# Patient Record
Sex: Male | Born: 1965
Health system: Southern US, Community
[De-identification: ages and names within clinical notes are randomized; demographics above are authoritative.]

## PROBLEM LIST (undated history)

## (undated) DIAGNOSIS — E785 Hyperlipidemia, unspecified: Secondary | ICD-10-CM

## (undated) DIAGNOSIS — I10 Essential (primary) hypertension: Secondary | ICD-10-CM

## (undated) DIAGNOSIS — T7840XA Allergy, unspecified, initial encounter: Secondary | ICD-10-CM

## (undated) DIAGNOSIS — E119 Type 2 diabetes mellitus without complications: Secondary | ICD-10-CM

## (undated) DIAGNOSIS — E291 Testicular hypofunction: Secondary | ICD-10-CM

## (undated) HISTORY — DX: Hyperlipidemia, unspecified: E78.5

## (undated) HISTORY — DX: Essential (primary) hypertension: I10

## (undated) HISTORY — DX: Allergy, unspecified, initial encounter: T78.40XA

## (undated) HISTORY — DX: Testicular hypofunction: E29.1

---

## 1991-09-28 HISTORY — PX: VASECTOMY: SHX75

## 2008-07-17 ENCOUNTER — Ambulatory Visit: Payer: Self-pay | Admitting: Family Medicine

## 2008-07-28 ENCOUNTER — Ambulatory Visit: Payer: Self-pay | Admitting: Family Medicine

## 2010-12-30 ENCOUNTER — Ambulatory Visit: Payer: Self-pay | Admitting: Family Medicine

## 2011-01-26 ENCOUNTER — Ambulatory Visit: Payer: Self-pay | Admitting: Family Medicine

## 2012-04-10 ENCOUNTER — Ambulatory Visit: Payer: Self-pay | Admitting: Family Medicine

## 2012-04-10 LAB — HEMOGLOBIN A1C: Hemoglobin A1C: 6.2 % (ref 4.2–6.3)

## 2014-04-01 LAB — LIPID PANEL
CHOLESTEROL: 151 mg/dL (ref 0–200)
HDL: 48 mg/dL (ref 35–70)
LDL Cholesterol: 90 mg/dL
Triglycerides: 64 mg/dL (ref 40–160)

## 2014-05-10 ENCOUNTER — Ambulatory Visit: Payer: Self-pay | Admitting: Gastroenterology

## 2014-05-10 LAB — HM COLONOSCOPY

## 2014-05-15 LAB — PATHOLOGY REPORT

## 2014-12-16 LAB — HEMOGLOBIN A1C: Hgb A1c MFr Bld: 6.8 % — AB (ref 4.0–6.0)

## 2015-03-02 ENCOUNTER — Encounter: Payer: Self-pay | Admitting: *Deleted

## 2015-03-02 ENCOUNTER — Emergency Department
Admission: EM | Admit: 2015-03-02 | Discharge: 2015-03-02 | Disposition: A | Discharge status: Home / Self Care | Payer: 59 | Attending: Emergency Medicine | Admitting: Emergency Medicine

## 2015-03-02 ENCOUNTER — Other Ambulatory Visit: Payer: Self-pay

## 2015-03-02 DIAGNOSIS — E119 Type 2 diabetes mellitus without complications: Secondary | ICD-10-CM | POA: Diagnosis not present

## 2015-03-02 DIAGNOSIS — Z794 Long term (current) use of insulin: Secondary | ICD-10-CM | POA: Insufficient documentation

## 2015-03-02 DIAGNOSIS — Y998 Other external cause status: Secondary | ICD-10-CM | POA: Insufficient documentation

## 2015-03-02 DIAGNOSIS — Z79899 Other long term (current) drug therapy: Secondary | ICD-10-CM | POA: Insufficient documentation

## 2015-03-02 DIAGNOSIS — R55 Syncope and collapse: Secondary | ICD-10-CM | POA: Diagnosis present

## 2015-03-02 DIAGNOSIS — Y9389 Activity, other specified: Secondary | ICD-10-CM | POA: Insufficient documentation

## 2015-03-02 DIAGNOSIS — Y9289 Other specified places as the place of occurrence of the external cause: Secondary | ICD-10-CM | POA: Insufficient documentation

## 2015-03-02 DIAGNOSIS — S0181XA Laceration without foreign body of other part of head, initial encounter: Secondary | ICD-10-CM

## 2015-03-02 DIAGNOSIS — W01198A Fall on same level from slipping, tripping and stumbling with subsequent striking against other object, initial encounter: Secondary | ICD-10-CM | POA: Insufficient documentation

## 2015-03-02 DIAGNOSIS — S01112A Laceration without foreign body of left eyelid and periocular area, initial encounter: Secondary | ICD-10-CM | POA: Diagnosis not present

## 2015-03-02 DIAGNOSIS — Z23 Encounter for immunization: Secondary | ICD-10-CM | POA: Diagnosis not present

## 2015-03-02 HISTORY — DX: Type 2 diabetes mellitus without complications: E11.9

## 2015-03-02 LAB — BASIC METABOLIC PANEL
Anion gap: 9 (ref 5–15)
BUN: 23 mg/dL — AB (ref 6–20)
CO2: 24 mmol/L (ref 22–32)
Calcium: 9.4 mg/dL (ref 8.9–10.3)
Chloride: 102 mmol/L (ref 101–111)
Creatinine, Ser: 1.02 mg/dL (ref 0.61–1.24)
Glucose, Bld: 175 mg/dL — ABNORMAL HIGH (ref 65–99)
Potassium: 4 mmol/L (ref 3.5–5.1)
Sodium: 135 mmol/L (ref 135–145)

## 2015-03-02 LAB — CBC
HCT: 46.6 % (ref 40.0–52.0)
HEMOGLOBIN: 16.8 g/dL (ref 13.0–18.0)
MCH: 30.8 pg (ref 26.0–34.0)
MCHC: 36.1 g/dL — ABNORMAL HIGH (ref 32.0–36.0)
MCV: 85.4 fL (ref 80.0–100.0)
Platelets: 183 10*3/uL (ref 150–440)
RBC: 5.46 MIL/uL (ref 4.40–5.90)
RDW: 12.9 % (ref 11.5–14.5)
WBC: 9.2 10*3/uL (ref 3.8–10.6)

## 2015-03-02 LAB — TROPONIN I: Troponin I: <0.03 ng/mL (ref <0.031)

## 2015-03-02 LAB — GLUCOSE, CAPILLARY: Glucose-Capillary: 185 mg/dL — ABNORMAL HIGH (ref 65–99)

## 2015-03-02 MED ORDER — TETANUS-DIPHTH-ACELL PERTUSSIS 5-2.5-18.5 LF-MCG/0.5 IM SUSP
INTRAMUSCULAR | Status: AC
  Administered 2015-03-02: 0.5 mL via INTRAMUSCULAR
  Filled 2015-03-02: qty 0.5

## 2015-03-02 MED ORDER — LIDOCAINE-EPINEPHRINE (PF) 1 %-1:200000 IJ SOLN
INTRAMUSCULAR | Status: AC
  Filled 2015-03-02: qty 30

## 2015-03-02 MED ORDER — SODIUM CHLORIDE 0.9 % IV BOLUS (SEPSIS)
1000.0000 mL | Freq: Once | INTRAVENOUS | Status: AC
  Administered 2015-03-02: 1000 mL via INTRAVENOUS

## 2015-03-02 MED ORDER — TETANUS-DIPHTH-ACELL PERTUSSIS 5-2.5-18.5 LF-MCG/0.5 IM SUSP
0.5000 mL | Freq: Once | INTRAMUSCULAR | Status: AC
  Administered 2015-03-02: 0.5 mL via INTRAMUSCULAR

## 2015-03-02 NOTE — ED Notes (Signed)
Pt alert and oriented X4, active, cooperative, pt in NAD. RR even and unlabored, color WNL.  Pt informed to return if any life threatening symptoms occur.   Signature pad not receiving signatures for patient to sign. Pt verbalizes understanding, wife at bedside.

## 2015-03-02 NOTE — ED Notes (Signed)
Pt resting, lights dimmed for comfort. Pt declines needs at this time.

## 2015-03-02 NOTE — Discharge Instructions (Signed)
Facial Laceration ° A facial laceration is a cut on the face. These injuries can be painful and cause bleeding. Lacerations usually heal quickly, but they need special care to reduce scarring. °DIAGNOSIS  °Your health care provider will take a medical history, ask for details about how the injury occurred, and examine the wound to determine how deep the cut is. °TREATMENT  °Some facial lacerations may not require closure. Others may not be able to be closed because of an increased risk of infection. The risk of infection and the chance for successful closure will depend on various factors, including the amount of time since the injury occurred. °The wound may be cleaned to help prevent infection. If closure is appropriate, pain medicines may be given if needed. Your health care provider will use stitches (sutures), wound glue (adhesive), or skin adhesive strips to repair the laceration. These tools bring the skin edges together to allow for faster healing and a better cosmetic outcome. If needed, you may also be given a tetanus shot. °HOME CARE INSTRUCTIONS °· Only take over-the-counter or prescription medicines as directed by your health care provider. °· Follow your health care provider's instructions for wound care. These instructions will vary depending on the technique used for closing the wound. °For Sutures: °· Keep the wound clean and dry.   °· If you were given a bandage (dressing), you should change it at least once a day. Also change the dressing if it becomes wet or dirty, or as directed by your health care provider.   °· Wash the wound with soap and water 2 times a day. Rinse the wound off with water to remove all soap. Pat the wound dry with a clean towel.   °· After cleaning, apply a thin layer of the antibiotic ointment recommended by your health care provider. This will help prevent infection and keep the dressing from sticking.   °· You may shower as usual after the first 24 hours. Do not soak the  wound in water until the sutures are removed.   °· Get your sutures removed as directed by your health care provider. With facial lacerations, sutures should usually be taken out after 4-5 days to avoid stitch marks.   °· Wait a few days after your sutures are removed before applying any makeup. °For Skin Adhesive Strips: °· Keep the wound clean and dry.   °· Do not get the skin adhesive strips wet. You may bathe carefully, using caution to keep the wound dry.   °· If the wound gets wet, pat it dry with a clean towel.   °· Skin adhesive strips will fall off on their own. You may trim the strips as the wound heals. Do not remove skin adhesive strips that are still stuck to the wound. They will fall off in time.   °For Wound Adhesive: °· You may briefly wet your wound in the shower or bath. Do not soak or scrub the wound. Do not swim. Avoid periods of heavy sweating until the skin adhesive has fallen off on its own. After showering or bathing, gently pat the wound dry with a clean towel.   °· Do not apply liquid medicine, cream medicine, ointment medicine, or makeup to your wound while the skin adhesive is in place. This may loosen the film before your wound is healed.   °· If a dressing is placed over the wound, be careful not to apply tape directly over the skin adhesive. This may cause the adhesive to be pulled off before the wound is healed.   °· Avoid   prolonged exposure to sunlight or tanning lamps while the skin adhesive is in place. °· The skin adhesive will usually remain in place for 5-10 days, then naturally fall off the skin. Do not pick at the adhesive film.   °After Healing: °Once the wound has healed, cover the wound with sunscreen during the day for 1 full year. This can help minimize scarring. Exposure to ultraviolet light in the first year will darken the scar. It can take 1-2 years for the scar to lose its redness and to heal completely.  °SEEK IMMEDIATE MEDICAL CARE IF: °· You have redness, pain, or  swelling around the wound.   °· You see a yellowish-white fluid (pus) coming from the wound.   °· You have chills or a fever.   °MAKE SURE YOU: °· Understand these instructions. °· Will watch your condition. °· Will get help right away if you are not doing well or get worse. °Document Released: 10/21/2004 Document Revised: 07/04/2013 Document Reviewed: 04/26/2013 °Exit[employer]® Patient Information ©2015 [employer]. This information is not intended to replace advice given to you by your health care provider. Make sure you discuss any questions you have with your health care provider. ° ° °Syncope °Syncope is a medical term for fainting or passing out. This means you lose consciousness and drop to the ground. People are generally unconscious for less than 5 minutes. You may have some muscle twitches for up to 15 seconds before waking up and returning to normal. Syncope occurs more often in older adults, but it can happen to anyone. While most causes of syncope are not dangerous, syncope can be a sign of a serious medical problem. It is important to seek medical care.  °CAUSES  °Syncope is caused by a sudden drop in blood flow to the brain. The specific cause is often not determined. Factors that can bring on syncope include: °· Taking medicines that lower blood pressure. °· Sudden changes in posture, such as standing up quickly. °· Taking more medicine than prescribed. °· Standing in one place for too long. °· Seizure disorders. °· Dehydration and excessive exposure to heat. °· Low blood sugar (hypoglycemia). °· Straining to have a bowel movement. °· Heart disease, irregular heartbeat, or other circulatory problems. °· Fear, emotional distress, seeing blood, or severe pain. °SYMPTOMS  °Right before fainting, you may: °· Feel dizzy or light-headed. °· Feel nauseous. °· See all white or all black in your field of vision. °· Have cold, clammy skin. °DIAGNOSIS  °Your health care provider will ask about your symptoms,  perform a physical exam, and perform an electrocardiogram (ECG) to record the electrical activity of your heart. Your health care provider may also perform other heart or blood tests to determine the cause of your syncope which may include: °· Transthoracic echocardiogram (TTE). During echocardiography, sound waves are used to evaluate how blood flows through your heart. °· Transesophageal echocardiogram (TEE). °· Cardiac monitoring. This allows your health care provider to monitor your heart rate and rhythm in real time. °· Holter monitor. This is a portable device that records your heartbeat and can help diagnose heart arrhythmias. It allows your health care provider to track your heart activity for several days, if needed. °· Stress tests by exercise or by giving medicine that makes the heart beat faster. °TREATMENT  °In most cases, no treatment is needed. Depending on the cause of your syncope, your health care provider may recommend changing or stopping some of your medicines. °HOME CARE INSTRUCTIONS °· Have someone stay with you   until you feel stable.  Do not drive, use machinery, or play sports until your health care provider says it is okay.  Keep all follow-up appointments as directed by your health care provider.  Lie down right away if you start feeling like you might faint. Breathe deeply and steadily. Wait until all the symptoms have passed.  Drink enough fluids to keep your urine clear or pale yellow.  If you are taking blood pressure or heart medicine, get up slowly and take several minutes to sit and then stand. This can reduce dizziness. SEEK IMMEDIATE MEDICAL CARE IF:   You have a severe headache.  You have unusual pain in the chest, abdomen, or back.  You are bleeding from your mouth or rectum, or you have black or tarry stool.  You have an irregular or very fast heartbeat.  You have pain with breathing.  You have repeated fainting or seizure-like jerking during an  episode.  You faint when sitting or lying down.  You have confusion.  You have trouble walking.  You have severe weakness.  You have vision problems. If you fainted, call your local emergency services (911 in [place] Do not drive yourself to the hospital.  MAKE SURE YOU:  Understand these instructions.  Will watch your condition.  Will get help right away if you are not doing well or get worse. Document Released: 09/13/2005 Document Revised: 09/18/2013 Document Reviewed: 11/12/2011 [facility] Patient Information 2015 [place], [place]. This information is not intended to replace advice given to you by your health care provider. Make sure you discuss any questions you have with your health care provider.     You have been seen in the emergency department after a syncopal episode have suffered a laceration. Your labs have shown normal results. Please drink plenty of fluids over the next 2-3 days and follow-up with your primary care doctor this week return to the emergency department for any further syncopal episodes, or any chest pain, pressure, tightness or any trouble breathing. Your laceration has been repaired with sutures which will need to be removed in 5 days. Patient follow-up with your primary care doctor, or return to the emergency department for suture removal at that time. Please keep the laceration clean, you may clean daily with soap and warm water and pat dry do not wipe. Please keep area covered with Neosporin or similar anti-bacterial ointment.

## 2015-03-02 NOTE — ED Notes (Signed)
MD at bedside. 

## 2015-03-02 NOTE — ED Provider Notes (Signed)
[facility] Emergency Department Provider Note  Time seen: 7:22 AM  I have reviewed the triage vital signs and the nursing notes.   HISTORY  Chief Complaint Loss of Consciousness and Facial Laceration    HPI Ivan Booker is a 49 y.o. male with a past medical history of hyperlipidemia and diabetes presents the emergency department after syncopal episode. According to the patient he woke up around 3 AM with an urge to have a bowel movement as well as urinate. He went to the bathroom but began feeling very nauseated and sweaty and briefly passed out hitting his head on the bathroom counter causing a laceration above his left eye. He states he immediately felt better, and all of his symptoms of nausea and sweatiness resolved. He states yesterday he was drinking alcohol, and playing golf for several hours in the heat. He said he went to bed feeling dehydrated with some muscle cramping. He denies ever passing out in the past. He denies any chest pain at any time. States he feels normal currently.    Past Medical History  Diagnosis Date  . Diabetes mellitus without complication     There are no active problems to display for this patient.   History reviewed. No pertinent past surgical history.  Current Outpatient Rx  Name  Route  Sig  Dispense  Refill  . insulin glargine (LANTUS) 100 UNIT/ML injection   Subcutaneous   Inject 34 Units into the skin at bedtime.         . insulin lispro (HUMALOG) 100 UNIT/ML injection   Subcutaneous   Inject into the skin 3 (three) times daily before meals.         Marland Kitchen lisinopril (PRINIVIL,ZESTRIL) 5 MG tablet   Oral   Take 5 mg by mouth daily.         . rosuvastatin (CRESTOR) 10 MG tablet   Oral   Take 10 mg by mouth daily.           Allergies Review of patient's allergies indicates no known allergies.  History reviewed. No pertinent family history.  Social History History  Substance Use Topics  . Smoking  status: Never Smoker   . Smokeless tobacco: Never Used  . Alcohol Use: Yes     Comment: 1-2 beers on weekends    Review of Systems Constitutional: Negative for fever. Cardiovascular: Negative for chest pain. Respiratory: Negative for shortness of breath. Gastrointestinal: Negative for abdominal pain. Positive for nausea which has resolved. Negative for vomiting and diarrhea. Genitourinary: Negative for dysuria. Neurological: Negative for headache  10-point ROS otherwise negative.  ____________________________________________   PHYSICAL EXAM:  VITAL SIGNS: ED Triage Vitals  Enc Vitals Group     BP 03/02/15 0500 117/68 mmHg     Pulse Rate 03/02/15 0500 73     Resp 03/02/15 0500 18     Temp 03/02/15 0500 98.2 F (36.8 C)     Temp Source 03/02/15 0500 Oral     SpO2 03/02/15 0500 97 %     Weight 03/02/15 0500 225 lb (102.059 kg)     Height 03/02/15 0500  (1.88 m)     Head Cir --      Peak Flow --      Pain Score --      Pain Loc --      Pain Edu? --      Excl. in GC? --     Constitutional: Alert and oriented. Well appearing and in  no distress. Eyes: Normal exam ENT   Head: 3 cm laceration within the left eyebrow. Linear. Clean. Minimal oozin   Mouth/Throat: Mucous membranes are moist. Cardiovascular: Normal rate, regular rhythm. No murmurs Respiratory: Normal respiratory effort without tachypnea nor retractions. Breath sounds are clear  Gastrointestinal: Soft and nontender. No distention.  Musculoskeletal: Nontender with normal range of motion in all extremities. No lower extremity tenderness or edema. Neurologic:  Normal speech and language. No gross focal neurologic deficits Psychiatric: Mood and affect are normal. Speech and behavior are normal.   ____________________________________________    EKG  EKG reviewed and interpreted by myself shows normal sinus rhythm at 72 bpm, narrow QRS, normal axis, normal intervals, no concerning ST changes  noted.  ____________________________________________   INITIAL IMPRESSION / ASSESSMENT AND PLAN / ED COURSE  Pertinent labs & imaging results that were available during my care of the patient were reviewed by me and considered in my medical decision making (see chart for details).  49 year old male with a past medical history of diabetes presents for a syncopal/near syncopal episode. Patient with a 3 cm laceration to the left eyebrow. No other traumatic findings. Denies headache or focal weakness/numbness. Appears overall very well denies any symptoms currently states he feels back to normal. Patient's labs are largely within normal limits, troponin is currently pending. Syncopal episode likely a result of orthostatic/vasovagal episode. Patient believes he is dehydrated after coughing for several hours in the heat yesterday and drinking alcohol last night. We will give the patient 1 L normal saline bolus, await for troponin to result, and suture laceration.  LACERATION REPAIR Performed by: Minna AntisPADUCHOWSKI, Anayah Arvanitis Authorized by: Minna AntisPADUCHOWSKI, Darrnell Mangiaracina Consent: Verbal consent obtained. Risks and benefits: risks, benefits and alternatives were discussed Consent given by: patient Patient identity confirmed: provided demographic data Prepped and Draped in normal sterile fashion Wound explored  Laceration Location: Left eyebrow  Laceration Length: 3 cm  No Foreign Bodies seen or palpated  Anesthesia: local infiltration  Local anesthetic: lidocaine 1% % with epinephrine  Anesthetic total: 5 ml  Irrigation method: syringe Amount of cleaning: standard  Skin closure: 6-0 proline   Number of sutures: 7   Technique: Simple interrupted   Patient tolerance: Patient tolerated the procedure well with no immediate complications.   ____________________________________________   FINAL CLINICAL IMPRESSION(S) / ED DIAGNOSES  Laceration Syncope   Minna AntisKevin Jonovan Boedecker, MD 03/02/15 [phone]

## 2015-03-02 NOTE — ED Notes (Signed)
Pt sleeping, spouse sleeping at bedside.

## 2015-03-02 NOTE — ED Notes (Signed)
Report to [facility], rn.  

## 2015-03-02 NOTE — ED Notes (Signed)
Pt urinated, felt light headed immediately afterwards, became diaphoretic and fainted. Pt struck R eyebrow on door plate and sustained laceration. Bleeding poorly controlled at this time. Pt took ibuprofen 400 mg @ 0330 after the injury.

## 2015-03-07 ENCOUNTER — Ambulatory Visit (INDEPENDENT_AMBULATORY_CARE_PROVIDER_SITE_OTHER): Payer: 59 | Admitting: Family Medicine

## 2015-03-07 ENCOUNTER — Encounter: Payer: Self-pay | Admitting: Family Medicine

## 2015-03-07 VITALS — BP 118/64 | HR 72 | Temp 98.4°F | Resp 18 | Ht 73.5 in | Wt 234.9 lb

## 2015-03-07 DIAGNOSIS — J302 Other seasonal allergic rhinitis: Secondary | ICD-10-CM | POA: Insufficient documentation

## 2015-03-07 DIAGNOSIS — E291 Testicular hypofunction: Secondary | ICD-10-CM | POA: Insufficient documentation

## 2015-03-07 DIAGNOSIS — E1049 Type 1 diabetes mellitus with other diabetic neurological complication: Secondary | ICD-10-CM | POA: Insufficient documentation

## 2015-03-07 DIAGNOSIS — R55 Syncope and collapse: Secondary | ICD-10-CM

## 2015-03-07 DIAGNOSIS — E559 Vitamin D deficiency, unspecified: Secondary | ICD-10-CM | POA: Insufficient documentation

## 2015-03-07 DIAGNOSIS — Z4802 Encounter for removal of sutures: Secondary | ICD-10-CM | POA: Diagnosis not present

## 2015-03-07 DIAGNOSIS — E669 Obesity, unspecified: Secondary | ICD-10-CM | POA: Insufficient documentation

## 2015-03-07 DIAGNOSIS — E785 Hyperlipidemia, unspecified: Secondary | ICD-10-CM | POA: Insufficient documentation

## 2015-03-07 DIAGNOSIS — N521 Erectile dysfunction due to diseases classified elsewhere: Secondary | ICD-10-CM

## 2015-03-07 NOTE — Patient Instructions (Signed)
Concussion  A concussion, or closed-head injury, is a brain injury caused by a direct blow to the head or by a quick and sudden movement (jolt) of the head or neck. Concussions are usually not life-threatening. Even so, the effects of a concussion can be serious. If you have had a concussion before, you are more likely to experience concussion-like symptoms after a direct blow to the head.   CAUSES  · Direct blow to the head, such as from running into another player during a soccer game, being hit in a fight, or hitting your head on a hard surface.  · A jolt of the head or neck that causes the brain to move back and forth inside the skull, such as in a car crash.  SIGNS AND SYMPTOMS  The signs of a concussion can be hard to notice. Early on, they may be missed by you, family members, and health care providers. You may look fine but act or feel differently.  Symptoms are usually temporary, but they may last for days, weeks, or even longer. Some symptoms may appear right away while others may not show up for hours or days. Every head injury is different. Symptoms include:  · Mild to moderate headaches that will not go away.  · A feeling of pressure inside your head.  · Having more trouble than usual:  ¨ Learning or remembering things you have heard.  ¨ Answering questions.  ¨ Paying attention or concentrating.  ¨ Organizing daily tasks.  ¨ Making decisions and solving problems.  · Slowness in thinking, acting or reacting, speaking, or reading.  · Getting lost or being easily confused.  · Feeling tired all the time or lacking energy (fatigued).  · Feeling drowsy.  · Sleep disturbances.  ¨ Sleeping more than usual.  ¨ Sleeping less than usual.  ¨ Trouble falling asleep.  ¨ Trouble sleeping (insomnia).  · Loss of balance or feeling lightheaded or dizzy.  · Nausea or vomiting.  · Numbness or tingling.  · Increased sensitivity to:  ¨ Sounds.  ¨ Lights.  ¨ Distractions.  · Vision problems or eyes that tire  easily.  · Diminished sense of taste or smell.  · Ringing in the ears.  · Mood changes such as feeling sad or anxious.  · Becoming easily irritated or angry for little or no reason.  · Lack of motivation.  · Seeing or hearing things other people do not see or hear (hallucinations).  DIAGNOSIS  Your health care provider can usually diagnose a concussion based on a description of your injury and symptoms. He or she will ask whether you passed out (lost consciousness) and whether you are having trouble remembering events that happened right before and during your injury.  Your evaluation might include:  · A brain scan to look for signs of injury to the brain. Even if the test shows no injury, you may still have a concussion.  · Blood tests to be sure other problems are not present.  TREATMENT  · Concussions are usually treated in an emergency department, in urgent care, or at a clinic. You may need to stay in the hospital overnight for further treatment.  · Tell your health care provider if you are taking any medicines, including prescription medicines, over-the-counter medicines, and natural remedies. Some medicines, such as blood thinners (anticoagulants) and aspirin, may increase the chance of complications. Also tell your health care provider whether you have had alcohol or are taking illegal drugs. This information   may affect treatment.  · Your health care provider will send you home with important instructions to follow.  · How fast you will recover from a concussion depends on many factors. These factors include how severe your concussion is, what part of your brain was injured, your age, and how healthy you were before the concussion.  · Most people with mild injuries recover fully. Recovery can take time. In general, recovery is slower in older persons. Also, persons who have had a concussion in the past or have other medical problems may find that it takes longer to recover from their current injury.  HOME  CARE INSTRUCTIONS  General Instructions  · Carefully follow the directions your health care provider gave you.  · Only take over-the-counter or prescription medicines for pain, discomfort, or fever as directed by your health care provider.  · Take only those medicines that your health care provider has approved.  · Do not drink alcohol until your health care provider says you are well enough to do so. Alcohol and certain other drugs may slow your recovery and can put you at risk of further injury.  · If it is harder than usual to remember things, write them down.  · If you are easily distracted, try to do one thing at a time. For example, do not try to watch TV while fixing dinner.  · Talk with family members or close friends when making important decisions.  · Keep all follow-up appointments. Repeated evaluation of your symptoms is recommended for your recovery.  · Watch your symptoms and tell others to do the same. Complications sometimes occur after a concussion. Older adults with a brain injury may have a higher risk of serious complications, such as a blood clot on the brain.  · Tell your teachers, school nurse, school counselor, coach, athletic trainer, or work manager about your injury, symptoms, and restrictions. Tell them about what you can or cannot do. They should watch for:  ¨ Increased problems with attention or concentration.  ¨ Increased difficulty remembering or learning new information.  ¨ Increased time needed to complete tasks or assignments.  ¨ Increased irritability or decreased ability to cope with stress.  ¨ Increased symptoms.  · Rest. Rest helps the brain to heal. Make sure you:  ¨ Get plenty of sleep at night. Avoid staying up late at night.  ¨ Keep the same bedtime hours on weekends and weekdays.  ¨ Rest during the day. Take daytime naps or rest breaks when you feel tired.  · Limit activities that require a lot of thought or concentration. These include:  ¨ Doing homework or job-related  work.  ¨ Watching TV.  ¨ Working on the computer.  · Avoid any situation where there is potential for another head injury (football, hockey, soccer, basketball, martial arts, downhill snow sports and horseback riding). Your condition will get worse every time you experience a concussion. You should avoid these activities until you are evaluated by the appropriate follow-up health care providers.  Returning To Your Regular Activities  You will need to return to your normal activities slowly, not all at once. You must give your body and brain enough time for recovery.  · Do not return to sports or other athletic activities until your health care provider tells you it is safe to do so.  · Ask your health care provider when you can drive, ride a bicycle, or operate heavy machinery. Your ability to react may be slower after a   brain injury. Never do these activities if you are dizzy.  · Ask your health care provider about when you can return to work or school.  Preventing Another Concussion  It is very important to avoid another brain injury, especially before you have recovered. In rare cases, another injury can lead to permanent brain damage, brain swelling, or death. The risk of this is greatest during the first 7-10 days after a head injury. Avoid injuries by:  · Wearing a seat belt when riding in a car.  · Drinking alcohol only in moderation.  · Wearing a helmet when biking, skiing, skateboarding, skating, or doing similar activities.  · Avoiding activities that could lead to a second concussion, such as contact or recreational sports, until your health care provider says it is okay.  · Taking safety measures in your home.  ¨ Remove clutter and tripping hazards from floors and stairways.  ¨ Use grab bars in bathrooms and handrails by stairs.  ¨ Place non-slip mats on floors and in bathtubs.  ¨ Improve lighting in dim areas.  SEEK MEDICAL CARE IF:  · You have increased problems paying attention or  concentrating.  · You have increased difficulty remembering or learning new information.  · You need more time to complete tasks or assignments than before.  · You have increased irritability or decreased ability to cope with stress.  · You have more symptoms than before.  Seek medical care if you have any of the following symptoms for more than 2 weeks after your injury:  · Lasting (chronic) headaches.  · Dizziness or balance problems.  · Nausea.  · Vision problems.  · Increased sensitivity to noise or light.  · Depression or mood swings.  · Anxiety or irritability.  · Memory problems.  · Difficulty concentrating or paying attention.  · Sleep problems.  · Feeling tired all the time.  SEEK IMMEDIATE MEDICAL CARE IF:  · You have severe or worsening headaches. These may be a sign of a blood clot in the brain.  · You have weakness (even if only in one hand, leg, or part of the face).  · You have numbness.  · You have decreased coordination.  · You vomit repeatedly.  · You have increased sleepiness.  · One pupil is larger than the other.  · You have convulsions.  · You have slurred speech.  · You have increased confusion. This may be a sign of a blood clot in the brain.  · You have increased restlessness, agitation, or irritability.  · You are unable to recognize people or places.  · You have neck pain.  · It is difficult to wake you up.  · You have unusual behavior changes.  · You lose consciousness.  MAKE SURE YOU:  · Understand these instructions.  · Will watch your condition.  · Will get help right away if you are not doing well or get worse.  Document Released: 12/04/2003 Document Revised: 09/18/2013 Document Reviewed: 04/05/2013  [employer]® Patient Information ©2015 [employer]. This information is not intended to replace advice given to you by your health care provider. Make sure you discuss any questions you have with your health care provider.

## 2015-03-07 NOTE — Progress Notes (Signed)
Name: Ivan Booker   MRN: [id number]    DOB: 10/14/1965   Date:03/07/2015       Progress Note  Subjective  Chief Complaint  Chief Complaint  Patient presents with  . Suture / Staple Removal    Left Eyebrow- Patient had a syncopy episode on Sunday March 01, 2015 and fell on sink counter.      HPI  Vasovagal syncope: patient played golf last Saturday, June 5th, 2016 . It was a very hot day.  He noticed leg cramps when he went to bed that evening and when he go up to void around 2 am he felt diaphoretic, nauseated and his head felt warm. He leaned over the sink and after that he got up on the floor with a cut on his left eyebrow. His glucose was 107. He felt better as soon as he got up.  He went to [facility] because of bleeding from laceration.  Multiple tests were done. His glucose at [facility] was 175, his troponin and electrolytes were normal and he was diagnosed with vasovagal syncope.  He states since he went home he has some left arm soreness from Tdap and occasional headache, he also has noticed his glucose is running a little high  Patient Active Problem List   Diagnosis Date Noted  . DM (diabetes mellitus), type 1 with renal complications 00/71/2197  . Dyslipidemia 03/07/2015  . Hypogonadism male 03/07/2015  . Overweight 03/07/2015  . Allergic rhinitis, seasonal 03/07/2015  . Vitamin D deficiency 03/07/2015    History  Substance Use Topics  . Smoking status: Never Smoker   . Smokeless tobacco: Never Used  . Alcohol Use: Yes     Comment: 1-2 beers on weekends     Current outpatient prescriptions:  .  aspirin 81 MG chewable tablet, Chew 1 tablet by mouth daily., Disp: , Rfl:  .  Cholecalciferol (VITAMIN D) 2000 UNITS CAPS, Take 1 capsule by mouth daily., Disp: , Rfl:  .  GLUCOSE BLOOD VI, , Disp: , Rfl:  .  insulin glargine (LANTUS) 100 UNIT/ML injection, Inject 34 Units into the skin at bedtime., Disp: , Rfl:  .  insulin lispro (HUMALOG) 100 UNIT/ML injection, Inject into the skin 3  (three) times daily before meals., Disp: , Rfl:  .  lisinopril (PRINIVIL,ZESTRIL) 5 MG tablet, Take 5 mg by mouth daily., Disp: , Rfl:  .  rosuvastatin (CRESTOR) 10 MG tablet, Take 10 mg by mouth daily., Disp: , Rfl:  .  tadalafil (CIALIS) 5 MG tablet, Take 1 tablet by mouth as needed., Disp: , Rfl:  .  Testosterone (ANDROGEL PUMP) 12.5 MG/ACT (1%) GEL, Place 2 application onto the skin daily., Disp: , Rfl:   No Known Allergies  ROS  Ten systems reviewed and is negative except as mentioned in HPI  Objective  Filed Vitals:   03/07/15 1453  BP: 118/64  Pulse: 72  Temp: 98.4 F (36.9 C)  TempSrc: Oral  Resp: 18  Height: 6' 1.5" (1.867 m)  Weight: 234 lb 14.4 oz (106.55 kg)  SpO2: 98%    Body mass index is 30.57 kg/(m^2).    Physical Exam  Constitutional: Patient appears well-developed and well-nourished. No distress.  HENT: Head: Normocephalic sutures removed from left eyebrow  Eyes: Conjunctivae and EOM are normal. Pupils are equal, round, and reactive to light. No scleral icterus.  Neck: Normal range of motion. Neck supple. No JVD present. No thyromegaly present.  Cardiovascular: Normal rate, regular rhythm and normal heart sounds.  No murmur heard. No BLE edema. Pulmonary/Chest: Effort normal and breath sounds normal. No respiratory distress. Musculoskeletal: Normal range of motion, no joint effusions. No gross keft deltoid area, but no redness or swelling.  Neurological exam:oriented to person, place, and time. No cranial nerve deficit. Coordination, balance, strength, speech and gait are normal.  Skin: laceration healing on left eyebrow Psychiatric: Patient has a normal mood and affect. behavior is normal. Judgment and thought content normal.  Recent Results (from the past 2160 hour(s))  Hemoglobin A1c     Status: Abnormal   Collection Time: 12/16/14 12:00 AM  Result Value Ref Range   Hgb A1c MFr Bld 6.8 (A) 4.0 - 6.0 %  CBC     Status: Abnormal   Collection Time:  03/02/15  5:08 AM  Result Value Ref Range   WBC 9.2 3.8 - 10.6 K/uL   RBC 5.46 4.40 - 5.90 MIL/uL   Hemoglobin 16.8 13.0 - 18.0 g/dL    Comment: RESULT REPEATED AND VERIFIED   HCT 46.6 40.0 - 52.0 %    Comment: RESULT REPEATED AND VERIFIED   MCV 85.4 80.0 - 100.0 fL   MCH 30.8 26.0 - 34.0 pg   MCHC 36.1 (H) 32.0 - 36.0 g/dL   RDW 12.9 11.5 - 14.5 %   Platelets 183 150 - 440 K/uL  Basic metabolic panel     Status: Abnormal   Collection Time: 03/02/15  5:08 AM  Result Value Ref Range   Sodium 135 135 - 145 mmol/L   Potassium 4.0 3.5 - 5.1 mmol/L   Chloride 102 101 - 111 mmol/L   CO2 24 22 - 32 mmol/L   Glucose, Bld 175 (H) 65 - 99 mg/dL   BUN 23 (H) 6 - 20 mg/dL   Creatinine, Ser 1.02 0.61 - 1.24 mg/dL   Calcium 9.4 8.9 - 10.3 mg/dL   GFR calc non Af Amer >60 >60 mL/min   GFR calc Af Amer >60 >60 mL/min    Comment: (NOTE) The eGFR has been calculated using the CKD EPI equation. This calculation has not been validated in all clinical situations. eGFR's persistently <60 mL/min signify possible Chronic Kidney Disease.    Anion gap 9 5 - 15  Troponin I     Status: None   Collection Time: 03/02/15  5:08 AM  Result Value Ref Range   Troponin I <0.03 <0.031 ng/mL    Comment:        NO INDICATION OF MYOCARDIAL INJURY.   Glucose, capillary     Status: Abnormal   Collection Time: 03/02/15  5:32 AM  Result Value Ref Range   Glucose-Capillary 185 (H) 65 - 99 mg/dL     Assessment & Plan 1. Vasovagal syncope Reassurance for now, we will refer him to neurologist if recurrence. He has noticed some headaches since episode. Improves with ibuprofen prn. Discussed concussion symptoms and he wants to hold off on any other medications at this time  2. Visit for suture removal Area was prepped with alcohol Suture removal kit used Patient tolerated procedure well and 6 sutures were removed without complications.

## 2015-03-17 ENCOUNTER — Telehealth: Payer: Self-pay | Admitting: Family Medicine

## 2015-03-17 NOTE — Telephone Encounter (Signed)
Pt called in regards to getting his lab requisition for CPE on 03/20/15 printed for pick up on 03/18/15. Pt came in last week and had some stitches removed, but his sugars are still running high. Has been going on for three weeks now. Also left arm still sore from tetanus shot. Not sure if shoulder was injured during fall or if the arm is still sore from shot.

## 2015-03-17 NOTE — Telephone Encounter (Signed)
Patient is coming in on Thursday for a 3 month F/U and is going to bring in his glucose meter. Informed patient the Tdap does go into your muscle and can make your arm sore but also had a really nasty fall at home in the bathroom, so Dr. Carlynn Purl can evaluation at appointment.

## 2015-03-18 ENCOUNTER — Other Ambulatory Visit: Payer: Self-pay | Admitting: Family Medicine

## 2015-03-18 DIAGNOSIS — Z79899 Other long term (current) drug therapy: Secondary | ICD-10-CM

## 2015-03-18 DIAGNOSIS — E785 Hyperlipidemia, unspecified: Secondary | ICD-10-CM

## 2015-03-18 DIAGNOSIS — E1022 Type 1 diabetes mellitus with diabetic chronic kidney disease: Secondary | ICD-10-CM

## 2015-03-19 LAB — LIPID PANEL W/O CHOL/HDL RATIO
Cholesterol, Total: 148 mg/dL (ref 100–199)
HDL: 55 mg/dL (ref >39)
LDL Calculated: 82 mg/dL (ref 0–99)
Triglycerides: 54 mg/dL (ref 0–149)
VLDL CHOLESTEROL CAL: 11 mg/dL (ref 5–40)

## 2015-03-19 LAB — COMPREHENSIVE METABOLIC PANEL
ALBUMIN: 3.9 g/dL (ref 3.5–5.5)
ALK PHOS: 85 IU/L (ref 39–117)
ALT: 12 IU/L (ref 0–44)
AST: 13 IU/L (ref 0–40)
Albumin/Globulin Ratio: 1.8 (ref 1.1–2.5)
BILIRUBIN TOTAL: 0.8 mg/dL (ref 0.0–1.2)
BUN / CREAT RATIO: 21 — AB (ref 9–20)
BUN: 19 mg/dL (ref 6–24)
CHLORIDE: 100 mmol/L (ref 97–108)
CO2: 24 mmol/L (ref 18–29)
Calcium: 9.1 mg/dL (ref 8.7–10.2)
Creatinine, Ser: 0.91 mg/dL (ref 0.76–1.27)
GFR calc Af Amer: 112 mL/min/{1.73_m2} (ref >59)
GFR calc non Af Amer: 97 mL/min/{1.73_m2} (ref >59)
Globulin, Total: 2.2 g/dL (ref 1.5–4.5)
Glucose: 168 mg/dL — ABNORMAL HIGH (ref 65–99)
POTASSIUM: 4.4 mmol/L (ref 3.5–5.2)
Sodium: 138 mmol/L (ref 134–144)
Total Protein: 6.1 g/dL (ref 6.0–8.5)

## 2015-03-19 LAB — HGB A1C W/O EAG: Hgb A1c MFr Bld: 7.3 % — ABNORMAL HIGH (ref 4.8–5.6)

## 2015-03-20 ENCOUNTER — Ambulatory Visit (INDEPENDENT_AMBULATORY_CARE_PROVIDER_SITE_OTHER): Payer: 59 | Admitting: Family Medicine

## 2015-03-20 ENCOUNTER — Encounter: Payer: Self-pay | Admitting: Family Medicine

## 2015-03-20 VITALS — BP 118/62 | HR 80 | Temp 97.6°F | Resp 16 | Ht 74.0 in | Wt 229.2 lb

## 2015-03-20 DIAGNOSIS — E785 Hyperlipidemia, unspecified: Secondary | ICD-10-CM

## 2015-03-20 DIAGNOSIS — E1069 Type 1 diabetes mellitus with other specified complication: Secondary | ICD-10-CM

## 2015-03-20 DIAGNOSIS — N400 Enlarged prostate without lower urinary tract symptoms: Secondary | ICD-10-CM | POA: Diagnosis not present

## 2015-03-20 DIAGNOSIS — E1065 Type 1 diabetes mellitus with hyperglycemia: Secondary | ICD-10-CM

## 2015-03-20 DIAGNOSIS — N529 Male erectile dysfunction, unspecified: Secondary | ICD-10-CM

## 2015-03-20 DIAGNOSIS — IMO0002 Reserved for concepts with insufficient information to code with codable children: Secondary | ICD-10-CM

## 2015-03-20 DIAGNOSIS — N521 Erectile dysfunction due to diseases classified elsewhere: Secondary | ICD-10-CM

## 2015-03-20 LAB — POCT UA - MICROALBUMIN: Microalbumin Ur, POC: NEGATIVE mg/L

## 2015-03-20 NOTE — Progress Notes (Signed)
Name: Ivan Booker   MRN: [id number]    DOB: Jun 26, 1966   Date:03/20/2015       Progress Note  Subjective  Chief Complaint  Chief Complaint  Patient presents with  . Diabetes    Checks BG 4-5X daily Low-68, High-420  . Hypertension  . 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  DM has always been under good control, it is the first time that hgbA1C has gone above 7.0.  Likely related to recent vasovagal syncope and also had a mild reaction to Tdap.  He states glucose was running high , highest 420 , low of 68 , he denies any recent hypoglycemic episodes. Over the past 2 days he started to feel well again, and glucose is coming down, fasting today was 136.  He has ED from DM, and has seen Dr. Elenor Quinones in the past.  Dyslipidemia: last albs at goal, continue Atorvastatin no side effects  ED: he is taking Cialis $RemoveBefor'5mg'[id number]$  and it works well for him, he likes the daily medication.   Patient Active Problem List   Diagnosis Date Noted  . BPH (benign prostatic hyperplasia) 03/20/2015  . Diabetic erectile dysfunction associated with type 1 diabetes mellitus 03/20/2015  . Diabetes type 1, uncontrolled 03/07/2015  . Dyslipidemia 03/07/2015  . Hypogonadism male 03/07/2015  . Obesity (BMI 30.0-34.9) 03/07/2015  . Allergic rhinitis, seasonal 03/07/2015  . Vitamin D deficiency 03/07/2015    Past Surgical History  Procedure Laterality Date  . Vasectomy      History reviewed. No pertinent family history.  History   Social History  . Marital Status: Married    Spouse Name: N/A  . Number of Children: N/A  . Years of Education: N/A   Occupational History  . Not on file.   Social History Main Topics  . Smoking status: Never Smoker   . Smokeless tobacco: Never Used  . Alcohol Use: Yes     Comment: 1-2 beers on weekends  . Drug Use: No  . Sexual Activity: Yes    Birth Control/ Protection: None   Other Topics Concern   . Not on file   Social History Narrative     Current outpatient prescriptions:  .  aspirin 81 MG chewable tablet, Chew 1 tablet by mouth daily., Disp: , Rfl:  .  Cholecalciferol (VITAMIN D) 2000 UNITS CAPS, Take 1 capsule by mouth daily., Disp: , Rfl:  .  GLUCOSE BLOOD VI, , Disp: , Rfl:  .  insulin glargine (LANTUS) 100 UNIT/ML injection, Inject 34 Units into the skin at bedtime., Disp: , Rfl:  .  insulin lispro (HUMALOG) 100 UNIT/ML injection, Inject into the skin 3 (three) times daily before meals., Disp: , Rfl:  .  lisinopril (PRINIVIL,ZESTRIL) 5 MG tablet, Take 5 mg by mouth daily., Disp: , Rfl:  .  rosuvastatin (CRESTOR) 10 MG tablet, Take 10 mg by mouth daily., Disp: , Rfl:  .  tadalafil (CIALIS) 5 MG tablet, Take 1 tablet by mouth as needed., Disp: , Rfl:  .  Testosterone (ANDROGEL PUMP) 12.5 MG/ACT (1%) GEL, Place 2 application onto the skin daily., Disp: , Rfl:   No Known Allergies   ROS  Constitutional: Negative for fever or weight change.  Respiratory: Negative for cough and shortness of breath.   Cardiovascular: Negative for chest pain or palpitations.  Gastrointestinal: Negative for abdominal pain, no bowel changes.  Musculoskeletal: Negative for gait problem  or joint swelling.  Skin: Negative for rash.  Neurological: Negative for dizziness or headache.  No other specific complaints in a complete review of systems (except as listed in HPI above).   Objective  Filed Vitals:   03/20/15 1357  BP: 118/62  Pulse: 80  Temp: 97.6 F (36.4 C)  TempSrc: Oral  Resp: 16  Height: $Remove'6\' 2"'FMNGwzS$  (1.88 m)  Weight: 229 lb 3.2 oz (103.964 kg)  SpO2: 96%    Body mass index is 29.41 kg/(m^2).  Physical Exam Constitutional: Patient appears well-developed and well-nourished. No distress.  HENT: Head: Normocephalic and atraumatic. Nose: Nose normal. Mouth/Throat: Oropharynx is clear and moist. No oropharyngeal exudate.  Eyes: Conjunctivae and EOM are normal. Pupils are equal,  round, and reactive to light. No scleral icterus.  Neck: Normal range of motion. Neck supple. No JVD present. No thyromegaly present.  Cardiovascular: Normal rate, regular rhythm and normal heart sounds.  No murmur heard. No BLE edema. Pulmonary/Chest: Effort normal and breath sounds normal. No respiratory distress. Abdominal: Soft. Bowel sounds are normal, no distension. There is no tenderness. no masses Musculoskeletal: Normal range of motion, no joint effusions. No gross deformities Neurological: he is alert and oriented to person, place, and time. No cranial nerve deficit. Coordination, balance, strength, speech and gait are normal.  Skin: Skin is warm and dry. No rash noted. No erythema.  Psychiatric: Patient has a normal mood and affect. behavior is normal. Judgment and thought content normal.  Recent Results (from the past 2160 hour(s))  CBC     Status: Abnormal   Collection Time: 03/02/15  5:08 AM  Result Value Ref Range   WBC 9.2 3.8 - 10.6 K/uL   RBC 5.46 4.40 - 5.90 MIL/uL   Hemoglobin 16.8 13.0 - 18.0 g/dL    Comment: RESULT REPEATED AND VERIFIED   HCT 46.6 40.0 - 52.0 %    Comment: RESULT REPEATED AND VERIFIED   MCV 85.4 80.0 - 100.0 fL   MCH 30.8 26.0 - 34.0 pg   MCHC 36.1 (H) 32.0 - 36.0 g/dL   RDW 12.9 11.5 - 14.5 %   Platelets 183 150 - 440 K/uL  Basic metabolic panel     Status: Abnormal   Collection Time: 03/02/15  5:08 AM  Result Value Ref Range   Sodium 135 135 - 145 mmol/L   Potassium 4.0 3.5 - 5.1 mmol/L   Chloride 102 101 - 111 mmol/L   CO2 24 22 - 32 mmol/L   Glucose, Bld 175 (H) 65 - 99 mg/dL   BUN 23 (H) 6 - 20 mg/dL   Creatinine, Ser 1.02 0.61 - 1.24 mg/dL   Calcium 9.4 8.9 - 10.3 mg/dL   GFR calc non Af Amer >60 >60 mL/min   GFR calc Af Amer >60 >60 mL/min    Comment: (NOTE) The eGFR has been calculated using the CKD EPI equation. This calculation has not been validated in all clinical situations. eGFR's persistently <60 mL/min signify possible  Chronic Kidney Disease.    Anion gap 9 5 - 15  Troponin I     Status: None   Collection Time: 03/02/15  5:08 AM  Result Value Ref Range   Troponin I <0.03 <0.031 ng/mL    Comment:        NO INDICATION OF MYOCARDIAL INJURY.   Glucose, capillary     Status: Abnormal   Collection Time: 03/02/15  5:32 AM  Result Value Ref Range   Glucose-Capillary 185 (H) 65 - 99  mg/dL  Comprehensive metabolic panel     Status: Abnormal   Collection Time: 03/18/15  9:10 AM  Result Value Ref Range   Glucose 168 (H) 65 - 99 mg/dL   BUN 19 6 - 24 mg/dL   Creatinine, Ser 0.91 0.76 - 1.27 mg/dL   GFR calc non Af Amer 97 >59 mL/min/1.73   GFR calc Af Amer 112 >59 mL/min/1.73   BUN/Creatinine Ratio 21 (H) 9 - 20   Sodium 138 134 - 144 mmol/L   Potassium 4.4 3.5 - 5.2 mmol/L   Chloride 100 97 - 108 mmol/L   CO2 24 18 - 29 mmol/L   Calcium 9.1 8.7 - 10.2 mg/dL   Total Protein 6.1 6.0 - 8.5 g/dL   Albumin 3.9 3.5 - 5.5 g/dL   Globulin, Total 2.2 1.5 - 4.5 g/dL   Albumin/Globulin Ratio 1.8 1.1 - 2.5   Bilirubin Total 0.8 0.0 - 1.2 mg/dL   Alkaline Phosphatase 85 39 - 117 IU/L   AST 13 0 - 40 IU/L   ALT 12 0 - 44 IU/L  Lipid Panel w/o Chol/HDL Ratio     Status: None   Collection Time: 03/18/15  9:10 AM  Result Value Ref Range   Cholesterol, Total 148 100 - 199 mg/dL   Triglycerides 54 0 - 149 mg/dL   HDL 55 >39 mg/dL    Comment: According to ATP-III Guidelines, HDL-C >59 mg/dL is considered a negative risk factor for CHD.    VLDL Cholesterol Cal 11 5 - 40 mg/dL   LDL Calculated 82 0 - 99 mg/dL  Hgb A1c w/o eAG     Status: Abnormal   Collection Time: 03/18/15  9:10 AM  Result Value Ref Range   Hgb A1c MFr Bld 7.3 (H) 4.8 - 5.6 %    Comment:          Pre-diabetes: 5.7 - 6.4          Diabetes: >6.4          Glycemic control for adults with diabetes: <7.0   POCT UA - Microalbumin     Status: Normal   Collection Time: 03/20/15  2:22 PM  Result Value Ref Range   Microalbumin Ur, POC negative  mg/L   Creatinine, POC  mg/dL   Albumin/Creatinine Ratio, Urine, POC      Diabetic Foot Exam: Diabetic Foot Exam - Simple   Simple Foot Form  Visual Inspection  See comments:  Yes  Sensation Testing  Intact to touch and monofilament testing bilaterally:  Yes  Pulse Check  Posterior Tibialis and Dorsalis pulse intact bilaterally:  Yes  Comments  Callus formation both first toes       PHQ2/9: Depression screen PHQ 2/9 03/07/2015  Decreased Interest 0  Down, Depressed, Hopeless 0  PHQ - 2 Score 0     Fall Risk: Fall Risk  03/07/2015  Falls in the past year? Yes  Number falls in past yr: 1  Injury with Fall? Yes     Assessment & Plan  1. Diabetes type 1, uncontrolled Continue current regiment, glucose is getting back to normal. Call back if it gets worse again, he is on lisinopril low dose, urine micro normal today - POCT UA - Microalbumin  2. BPH (benign prostatic hyperplasia) Taking Cialis , he has nocturia at most twice per night, doing well   3. Diabetic erectile dysfunction associated with type 1 diabetes mellitus Continue cialis, used to see Dr. Bernardo Heater  4. Dyslipidemia Continue  Atorvastatin

## 2015-04-01 ENCOUNTER — Other Ambulatory Visit: Payer: Self-pay | Admitting: Family Medicine

## 2015-04-17 ENCOUNTER — Other Ambulatory Visit: Payer: Self-pay | Admitting: Family Medicine

## 2015-05-27 ENCOUNTER — Telehealth: Payer: Self-pay

## 2015-06-20 ENCOUNTER — Encounter: Payer: Self-pay | Admitting: Pharmacist

## 2015-06-20 ENCOUNTER — Other Ambulatory Visit: Payer: Self-pay | Admitting: Pharmacist

## 2015-06-20 NOTE — Patient Outreach (Signed)
[facility]) Care Management  [place] [facility]   06/20/2015  Ivan Booker 05-Feb-1966 [id number]  Subjective: Patient presents today for 3 month diabetes follow-up as part of the employer-sponsored Link to Wellness program.  Patient was diagnosed with Type I diabetes in ~1996. Current diabetes regimen includes Lantus 34 units daily and Humalog sliding scale.  Patient also continues on daily ASA, ACE Inhibitor and statin.  Most recent MD follow-up was 3 months ago.  Patient has a PCP appointment scheduled next week.    Patient reports his most recent A1c was 7.1% which was the highest it has been in years.  He reports experiencing a vasovagal episode a few months ago that caused him to pass out in his bathroom.  He was taken to the hospital by his wife and he required stiches above his eyebrow.  Patient reports his blood sugar was elevated for several weeks after that episode and he was never able to figure out the reason for the elevations.  He reports his blood sugars have "leveled out" since then, and he is hopeful his A1c has improved to <7%.  Patient reports being very active and enjoys to play golf, take walks and mow his lawn.  He reports counting his carbohydrates and adjusting his insulin based on his carbohydrate intake and his blood glucose before meals.  Patient reports one recent hypoglycemic episode after playing golf, he reports experiencing blurry vision.  Patient reports appropriately treating hypoglycemia.    Objective:  BP: 115/74  Current Medications: Current Outpatient Prescriptions  Medication Sig Dispense Refill  . aspirin 81 MG chewable tablet Chew 1 tablet by mouth daily.    Marland Kitchen atorvastatin (LIPITOR) 20 MG tablet Take 20 mg by mouth daily.    . Cholecalciferol (VITAMIN D) 2000 UNITS CAPS Take 1 capsule by mouth daily.    Marland Kitchen CIALIS 5 MG tablet TAKE 1 TABLET BY MOUTH ONCE DAILY 30 tablet 4  . GLUCOSE BLOOD VI     . insulin glargine (LANTUS) 100 UNIT/ML  injection Inject 34 Units into the skin at bedtime.    . insulin lispro (HUMALOG) 100 UNIT/ML injection Inject into the skin 3 (three) times daily before meals.    Marland Kitchen lisinopril (PRINIVIL,ZESTRIL) 5 MG tablet TAKE 1 TABLET BY MOUTH ONCE DAILY 90 tablet 1  . Testosterone (ANDROGEL PUMP) 12.5 MG/ACT (1%) GEL Place 2 application onto the skin daily.    . rosuvastatin (CRESTOR) 10 MG tablet Take 10 mg by mouth daily.     No current facility-administered medications for this visit.   Functional Status: In your present state of health, do you have any difficulty performing the following activities: 06/20/2015 03/07/2015  Hearing? N N  Vision? N N  Difficulty concentrating or making decisions? N N  Walking or climbing stairs? N N  Dressing or bathing? N N  Doing errands, shopping? N N   Fall/Depression Screening: PHQ 2/9 Scores 06/20/2015 03/07/2015  PHQ - 2 Score 0 0   Assessment: Diabetes: Most recent A1C was 7.3% which is not at goal of less than 7%.  Patient reports compliance with diabetes regimen, and is currently exercising at least 150 minutes per week and following a diabetic diet.  Patient appears motivated to improve A1c to <7%.    Plan: 1.  Patient to continue to take all medications as prescribed and monitor BG before meals and at bedtime.   2.  Patient will continue to exercise at least 150 minutes weekly.   3.  Patient will have his A1c checked at his PCP visit next week.  4.  Follow up scheduled on 09/12/15 at 2:00PM.    [facility] CM Care Plan Problem One        Most Recent Value   Care Plan Problem One  Diabetes   Role Documenting the Problem One  Clinical Pharmacist   Care Plan for Problem One  Active   [facility]N Long Term Goal (31-90 days)  Patient will work toward A1c <7%.    THN Long Term Goal Start Date  06/20/15   Interventions for Problem One Long Term Goal  Discussed diet and exercsice recommendations.  Patient will continue to monitor BG and take insulin as prescribed.        Lilla Shook, Pharm.D. Pharmacy Resident [facility]

## 2015-06-25 ENCOUNTER — Other Ambulatory Visit: Payer: Self-pay | Admitting: Family Medicine

## 2015-06-25 ENCOUNTER — Other Ambulatory Visit: Payer: Self-pay | Admitting: Pharmacist

## 2015-06-25 NOTE — Telephone Encounter (Signed)
Patient requesting refill. 

## 2015-06-25 NOTE — Patient Outreach (Signed)
[facility]) Care Management  06/25/2015  Ivan Booker 1966-02-28 [id number]   I mailed patient information regarding advanced directives as requested at our LTW visit.  I called patient to inform him to expect the package in the mail.  In addition, I let patient know that the advanced directives will need to be notarized.  I informed him that [employer] will have a booth at the benefits fair at [facility] on 07/02/15 and that they may be able to answer questions about the advanced directives and they will have a notary available at their booth.  Patient voiced understanding.     Lilla Shook, Pharm.D. Pharmacy Resident [facility]

## 2015-06-26 ENCOUNTER — Encounter: Payer: Self-pay | Admitting: Family Medicine

## 2015-06-26 ENCOUNTER — Ambulatory Visit (INDEPENDENT_AMBULATORY_CARE_PROVIDER_SITE_OTHER): Payer: 59 | Admitting: Family Medicine

## 2015-06-26 VITALS — BP 126/64 | HR 66 | Temp 98.3°F | Resp 16 | Ht 74.0 in | Wt 227.5 lb

## 2015-06-26 DIAGNOSIS — N529 Male erectile dysfunction, unspecified: Secondary | ICD-10-CM | POA: Insufficient documentation

## 2015-06-26 DIAGNOSIS — E1049 Type 1 diabetes mellitus with other diabetic neurological complication: Secondary | ICD-10-CM | POA: Diagnosis not present

## 2015-06-26 DIAGNOSIS — N400 Enlarged prostate without lower urinary tract symptoms: Secondary | ICD-10-CM | POA: Diagnosis not present

## 2015-06-26 DIAGNOSIS — N521 Erectile dysfunction due to diseases classified elsewhere: Secondary | ICD-10-CM

## 2015-06-26 DIAGNOSIS — N528 Other male erectile dysfunction: Secondary | ICD-10-CM | POA: Diagnosis not present

## 2015-06-26 DIAGNOSIS — E291 Testicular hypofunction: Secondary | ICD-10-CM

## 2015-06-26 DIAGNOSIS — E785 Hyperlipidemia, unspecified: Secondary | ICD-10-CM | POA: Diagnosis not present

## 2015-06-26 DIAGNOSIS — Z23 Encounter for immunization: Secondary | ICD-10-CM | POA: Diagnosis not present

## 2015-06-26 LAB — POCT UA - MICROALBUMIN: Microalbumin Ur, POC: NEGATIVE mg/L

## 2015-06-26 LAB — POCT GLYCOSYLATED HEMOGLOBIN (HGB A1C): Hemoglobin A1C: 6.7

## 2015-06-26 MED ORDER — ATORVASTATIN CALCIUM 20 MG PO TABS: 20.0000 mg | ORAL_TABLET | Freq: Every day | ORAL | Status: DC

## 2015-06-26 NOTE — Progress Notes (Signed)
Name: Ivan Booker   MRN: [id number]    DOB: 09/13/1966   Date:06/26/2015       Progress Note  Subjective  Chief Complaint  Chief Complaint  Patient presents with  . Medication Refill    3 month F/U  . Diabetes    Checks BS 4-5x daily, Low-50 Average-130 High-300's, patient states his sugar is starting to level out. When sugar drops from being active he will get blurry vision, and lightheaded.  . Hyperlipidemia    HPI  DMI with ED: he is doing well now, he had one episode of hypoglycemia after golfing last week - his vision got blurred, but he had a snack and glucose went up.  Otherwise feeling well, average 150's. Seldom has a glucose above 200. No polyphagia, polyuria or polydipsia. EC is well controlled with Cialis- not back to baseline but is better. Taking ace for kidney protection  BPH: used to see Urologist - Dr. Lonna Cobb - years ago, had normal biopsies in the past. Taking Cialis and is doing well. He has nocturia once per night, no dribbling.   Hypogonadism: he has been on testosterone supplementation, no aggression, no other side effects, last hgb checked in June was normal   Hyperlipidemia: at goal, taking Atorvastatin daily, no side effects   Patient Active Problem List   Diagnosis Date Noted  . Erectile dysfunction 06/26/2015  . BPH (benign prostatic hyperplasia) 03/20/2015  . 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 Dyslipidemia 03/07/2015  . Hypogonadism male 03/07/2015  . Obesity (BMI 30.0-34.9) 03/07/2015  . Allergic rhinitis, seasonal 03/07/2015  . Vitamin D deficiency 03/07/2015    Past Surgical History  Procedure Laterality Date  . Vasectomy      History reviewed. No pertinent family history.  Social History   Social History  . Marital Status: Married    Spouse Name: N/A  . Number of Children: N/A  . Years of Education: N/A   Occupational History  . Not on file.   Social History Main Topics  .  Smoking status: Never Smoker   . Smokeless tobacco: Never Used  . Alcohol Use: Yes     Comment: 1-2 beers on weekends  . Drug Use: No  . Sexual Activity: Yes    Birth Control/ Protection: None   Other Topics Concern  . Not on file   Social History Narrative     Current outpatient prescriptions:  .  aspirin 81 MG chewable tablet, Chew 1 tablet by mouth daily., Disp: , Rfl:  .  atorvastatin (LIPITOR) 20 MG tablet, Take 20 mg by mouth daily., Disp: , Rfl:  .  Cholecalciferol (VITAMIN D) 2000 UNITS CAPS, Take 1 capsule by mouth daily., Disp: , Rfl:  .  CIALIS 5 MG tablet, TAKE 1 TABLET BY MOUTH ONCE DAILY, Disp: 30 tablet, Rfl: 4 .  GLUCOSE BLOOD VI, , Disp: , Rfl:  .  insulin glargine (LANTUS) 100 UNIT/ML injection, Inject 34 Units into the skin at bedtime., Disp: , Rfl:  .  insulin lispro (HUMALOG) 100 UNIT/ML injection, Inject into the skin 3 (three) times daily before meals., Disp: , Rfl:  .  lisinopril (PRINIVIL,ZESTRIL) 5 MG tablet, TAKE 1 TABLET BY MOUTH ONCE DAILY, Disp: 90 tablet, Rfl: 1 .  Testosterone 12.5 MG/ACT (1%) GEL, APPLY 4 PUMPS ONCE DAILY AS DIRECTED, Disp: 450 g, Rfl: 1  No Known Allergies   ROS  Constitutional: Negative for fever or weight change.  Respiratory: Negative for cough  and shortness of breath.   Cardiovascular: Negative for chest pain or palpitations.  Gastrointestinal: Negative for abdominal pain, no bowel changes.  Musculoskeletal: Negative for gait problem or joint swelling.  Skin: Negative for rash.  Neurological: Negative for dizziness or headache.  No other specific complaints in a complete review of systems (except as listed in HPI above).  Objective  Filed Vitals:   06/26/15 1519  BP: 126/64  Pulse: 66  Temp: 98.3 F (36.8 C)  TempSrc: Oral  Resp: 16  Height:  (1.88 m)  Weight: 227 lb 8 oz (103.193 kg)  SpO2: 97%    Body mass index is 29.2 kg/(m^2).  Physical Exam  Constitutional: Patient appears well-developed and  well-nourished. No distress.  HEENT: head atraumatic, normocephalic, pupils equal and reactive to light, neck supple, throat within normal limits Cardiovascular: Normal rate, regular rhythm and normal heart sounds.  No murmur heard. No BLE edema. Pulmonary/Chest: Effort normal and breath sounds normal. No respiratory distress. Abdominal: Soft.  There is no tenderness. Psychiatric: Patient has a normal mood and affect. behavior is normal. Judgment and thought content normal.  Recent Results (from the past 2160 hour(s))  POCT HgB A1C     Status: Abnormal   Collection Time: 06/26/15  3:28 PM  Result Value Ref Range   Hemoglobin A1C 6.7   POCT UA - Microalbumin     Status: Normal   Collection Time: 06/26/15  3:28 PM  Result Value Ref Range   Microalbumin Ur, POC negative mg/L   Creatinine, POC  mg/dL   Albumin/Creatinine Ratio, Urine, POC       PHQ2/9: Depression screen [facility] 2/9 06/20/2015 03/07/2015  Decreased Interest 0 0  Down, Depressed, Hopeless 0 0  PHQ - 2 Score 0 0     Fall Risk: Fall Risk  06/20/2015 03/07/2015  Falls in the past year? Yes Yes  Number falls in past yr: 1 1  Injury with Fall? Yes Yes  Follow up Falls prevention discussed -    Assessment & Plan  1. 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 Needs flu shot  - Flu Vaccine QUAD 36+ mos PF IM (Fluarix & Fluzone Quad PF)  3. Hypogonadism male  - using testosterone supplementation  4. Dyslipidemia  - atorvastatin (LIPITOR) 20 MG tablet; Take 1 tablet (20 mg total) by mouth daily.  Dispense: 90 tablet; Refill: 2  5. BPH (benign prostatic hyperplasia)  Controlled with Cialis  6. Other male erectile dysfunction  Doing well on Cialis

## 2015-09-01 ENCOUNTER — Other Ambulatory Visit: Payer: Self-pay | Admitting: Family Medicine

## 2015-09-01 NOTE — Telephone Encounter (Signed)
Patient requesting refill. 

## 2015-09-12 ENCOUNTER — Other Ambulatory Visit: Payer: Self-pay | Admitting: Pharmacist

## 2015-09-12 ENCOUNTER — Encounter: Payer: Self-pay | Admitting: Pharmacist

## 2015-09-12 NOTE — Patient Outreach (Signed)
[facility]) Care Management  [place]   09/12/2015  Ivan Booker 1966-06-17 [id number]  Subjective: Patient presents today for 3 month diabetes follow-up as part of the employer-sponsored Link to Wellness program.  Patient was diagnosed with Type I diabetes in ~1996.  Current diabetes regimen includes Lantus 34 units and Humalog sliding scale.  Patient also continues on daily ASA, ACE Inhibitor and statin.  Patient reports adherence with medications. Most recent MD follow-up was on 06/26/15.  Patient has a pending appt for 09/30/15.  No med changes or major health changes at this time.    Patient's most recent A1c on 06/26/15 was 6.7% which has improved from 7.3% in June 2016.  Patient's 30-day blood glucose average is 166 mg/dL.  Patient reports approximately 1 hypoglycemic episode per month.  Patient has hypoglycemic awareness and reports appropriate hypoglycemia treatment.  Patient states he has glucose tablets with him when he drives, and his wife has glucose gel and glucagon pen available if needed.  Patient denies ever having to use the glucose gel or glucagon.  Patient reports exercising 2 to 3 times per week.  Patient reports activity level has decreased slightly due to cold weather and shorter days.  Patient continues to follow low carbohydrate diet and continues to count his carbohydrates.   Objective:  Blood pressure: 122/70  Current Medications: Current Outpatient Prescriptions  Medication Sig Dispense Refill  . aspirin 81 MG chewable tablet Chew 1 tablet by mouth daily.    Marland Kitchen atorvastatin (LIPITOR) 20 MG tablet Take 1 tablet (20 mg total) by mouth daily. 90 tablet 2  . Cholecalciferol (VITAMIN D) 2000 UNITS CAPS Take 1 capsule by mouth daily.    Marland Kitchen CIALIS 5 MG tablet TAKE 1 TABLET BY MOUTH ONCE DAILY 30 tablet 4  . GLUCOSE BLOOD VI     . insulin glargine (LANTUS) 100 UNIT/ML injection Inject 34 Units into the skin at bedtime.    . insulin lispro (HUMALOG)  100 UNIT/ML injection Inject into the skin 3 (three) times daily before meals.    Marland Kitchen lisinopril (PRINIVIL,ZESTRIL) 5 MG tablet TAKE 1 TABLET BY MOUTH ONCE DAILY 90 tablet 1  . Testosterone 12.5 MG/ACT (1%) GEL APPLY 4 PUMPS ONCE DAILY AS DIRECTED 450 g 1   No current facility-administered medications for this visit.   Functional Status: In your present state of health, do you have any difficulty performing the following activities: 06/20/2015 03/07/2015  Hearing? N N  Vision? N N  Difficulty concentrating or making decisions? N N  Walking or climbing stairs? N N  Dressing or bathing? N N  Doing errands, shopping? N N   Fall/Depression Screening: PHQ 2/9 Scores 06/20/2015 03/07/2015  PHQ - 2 Score 0 0   Assessment:  Diabetes:  Most recent A1C was 6.7% which is at goal of less than 7%. Weight is stable from last visit with me.  Patient reports occasional hypoglycemic episode and is able to verbalize appropriate hypoglycemia management plan.  Reviewed Rule of 15 with the patient.  Patient reports compliance with diabetes regimen, and is currently exercising 2 to 3 days per week and following a diabetic diet.  Patient appears motivated to maintain A1c <7%.     Plan: 1.  Patient will continue to take all medications as prescribed and monitor BG before meals and at bedtime.  2.  Encouraged patient to continue to exercise at least 150 minutes weekly even during colder winter months.  3.  Follow up 12/19/15  at 2:00 PM.    [facility] CM Care Plan Problem One        Most Recent Value   Care Plan Problem One  Diabetes   Role Documenting the Problem One  Clinical Pharmacist   Care Plan for Problem One  Active   THN Long Term Goal (31-90 days)  Patient will maintain A1c less than 7%   THN Long Term Goal Start Date  09/12/15   [facility] Long Term Goal Met Date  09/12/15   Interventions for Problem One Long Term Goal  Discussed diet and exercsice recommendations.  Patient will continue to monitor BG and take  insulin as prescribed.       Elisabeth Most, Pharm.D. Pharmacy Resident [place] [phone]

## 2015-09-25 ENCOUNTER — Ambulatory Visit: Payer: 59 | Admitting: Family Medicine

## 2015-09-30 ENCOUNTER — Ambulatory Visit (INDEPENDENT_AMBULATORY_CARE_PROVIDER_SITE_OTHER): Payer: 59 | Admitting: Family Medicine

## 2015-09-30 ENCOUNTER — Encounter: Payer: Self-pay | Admitting: Family Medicine

## 2015-09-30 VITALS — BP 118/62 | HR 72 | Temp 97.9°F | Resp 16 | Ht 74.0 in | Wt 233.1 lb

## 2015-09-30 DIAGNOSIS — N521 Erectile dysfunction due to diseases classified elsewhere: Secondary | ICD-10-CM

## 2015-09-30 DIAGNOSIS — E291 Testicular hypofunction: Secondary | ICD-10-CM | POA: Diagnosis not present

## 2015-09-30 DIAGNOSIS — E1049 Type 1 diabetes mellitus with other diabetic neurological complication: Secondary | ICD-10-CM | POA: Diagnosis not present

## 2015-09-30 DIAGNOSIS — Z79899 Other long term (current) drug therapy: Secondary | ICD-10-CM | POA: Diagnosis not present

## 2015-09-30 DIAGNOSIS — N400 Enlarged prostate without lower urinary tract symptoms: Secondary | ICD-10-CM | POA: Diagnosis not present

## 2015-09-30 DIAGNOSIS — Z0000 Encounter for general adult medical examination without abnormal findings: Secondary | ICD-10-CM

## 2015-09-30 DIAGNOSIS — Z23 Encounter for immunization: Secondary | ICD-10-CM

## 2015-09-30 NOTE — Progress Notes (Signed)
Name: Ivan Booker   MRN: [id number]    DOB: September 18, 1966   Date:09/30/2015       Progress Note  Subjective  Chief Complaint  Chief Complaint  Patient presents with  . Annual Exam    HPI  Male Exam: he is feeling well,  He has hypogonadism but doing well on Testosterone topically, libido is back to normal . Denies weak urinary stream or dribbling. No hesitancy. Nocturia at most once per night.  DMI with ED: he is doing well now. Last hypoglycemia a couple of weeks ago, around 51 . Otherwise feeling well, average 140's. He has several episodes of 250 -275 level 2 hours post-prandially. No polyphagia, polyuria or polydipsia. EC is well controlled with Cialis- not back to baseline but is better. Taking ace for kidney protection  BPH: used to see Urologist - Dr. Lonna Cobb - years ago, had normal biopsies in the past. Taking Cialis and is doing well. He has nocturia once per night, no dribbling.   Hyperlipidemia: at goal, taking Atorvastatin daily, no cramping or chest pain   Patient Active Problem List   Diagnosis Date Noted  . Erectile dysfunction 06/26/2015  . BPH (benign prostatic hyperplasia) 03/20/2015  . 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 Dyslipidemia 03/07/2015  . Hypogonadism male 03/07/2015  . Obesity (BMI 30.0-34.9) 03/07/2015  . Allergic rhinitis, seasonal 03/07/2015  . Vitamin D deficiency 03/07/2015    Past Surgical History  Procedure Laterality Date  . Vasectomy      History reviewed. No pertinent family history.  Social History   Social History  . Marital Status: Married    Spouse Name: N/A  . Number of Children: N/A  . Years of Education: N/A   Occupational History  . Not on file.   Social History Main Topics  . Smoking status: Never Smoker   . Smokeless tobacco: Never Used  . Alcohol Use: Yes     Comment: 1-2 beers on weekends  . Drug Use: No  . Sexual Activity: Yes    Birth Control/ Protection:  None   Other Topics Concern  . Not on file   Social History Narrative     Current outpatient prescriptions:  .  aspirin 81 MG chewable tablet, Chew 1 tablet by mouth daily., Disp: , Rfl:  .  atorvastatin (LIPITOR) 20 MG tablet, Take 1 tablet (20 mg total) by mouth daily., Disp: 90 tablet, Rfl: 2 .  B-D ULTRAFINE III SHORT PEN 31G X 8 MM MISC, , Disp: , Rfl: 5 .  Cholecalciferol (VITAMIN D) 2000 UNITS CAPS, Take 1 capsule by mouth daily., Disp: , Rfl:  .  CIALIS 5 MG tablet, TAKE 1 TABLET BY MOUTH ONCE DAILY, Disp: 30 tablet, Rfl: 4 .  GLUCOSE BLOOD VI, , Disp: , Rfl:  .  insulin glargine (LANTUS) 100 UNIT/ML injection, Inject 34 Units into the skin at bedtime., Disp: , Rfl:  .  insulin lispro (HUMALOG) 100 UNIT/ML injection, Inject into the skin 3 (three) times daily before meals., Disp: , Rfl:  .  lisinopril (PRINIVIL,ZESTRIL) 5 MG tablet, TAKE 1 TABLET BY MOUTH ONCE DAILY, Disp: 90 tablet, Rfl: 1 .  Testosterone 12.5 MG/ACT (1%) GEL, APPLY 4 PUMPS ONCE DAILY AS DIRECTED, Disp: 450 g, Rfl: 1  No Known Allergies   ROS   Constitutional: Negative for fever or weight change.  Respiratory: Negative for cough and shortness of breath.   Cardiovascular: Negative for chest pain or palpitations.  Gastrointestinal: Negative for abdominal pain, no bowel changes.  Musculoskeletal: Negative for gait problem or joint swelling.  Skin: Negative for rash.  Neurological: Negative for dizziness or headache.  No other specific complaints in a complete review of systems (except as listed in HPI above).  Objective  Filed Vitals:   09/30/15 1356  BP: 118/62  Pulse: 72  Temp: 97.9 F (36.6 C)  TempSrc: Oral  Resp: 16  Height: 6\' 2"  (1.88 m)  Weight: 233 lb 1.6 oz (105.733 kg)  SpO2: 98%    Body mass index is 29.92 kg/(m^2).  Physical Exam   Constitutional: Patient appears well-developed and well-nourished. No distress.  HENT: Head: Normocephalic and atraumatic. Ears: B TMs ok, no  erythema or effusion; Nose: Nose normal. Mouth/Throat: Oropharynx is clear and moist. No oropharyngeal exudate.  Eyes: Conjunctivae and EOM are normal. Pupils are equal, round, and reactive to light. No scleral icterus.  Neck: Normal range of motion. Neck supple. No JVD present. No thyromegaly present.  Cardiovascular: Normal rate, regular rhythm and normal heart sounds.  No murmur heard. No BLE edema. Pulmonary/Chest: Effort normal and breath sounds normal. No respiratory distress. Abdominal: Soft.  No distension. There is no tenderness. MALE GENITALIA: Normal descended testes bilaterally, no masses palpated, no hernias, no lesions, no discharge RECTAL: Prostate normal size and consistency, no rectal masses or hemorrhoids Musculoskeletal: Normal range of motion, no joint effusions. No gross deformities Neurological: he is alert and oriented to person, place, and time. No cranial nerve deficit. Coordination, balance, strength, speech and gait are normal.  Skin: Skin is warm and dry. No rash noted. No erythema.  Psychiatric: Patient has a normal mood and affect. behavior is normal. Judgment and thought content normal.  PHQ2/9: [facility] 2/9 09/30/2015 06/20/2015 03/07/2015  Decreased Interest 0 0 0  Down, Depressed, Hopeless 0 0 0  PHQ - 2 Score 0 0 0    Fall Risk: Fall Risk  09/30/2015 06/20/2015 03/07/2015  Falls in the past year? Yes Yes Yes  Number falls in past yr: 1 1 1   Injury with Fall? Yes Yes Yes  Follow up - Falls prevention discussed -    Functional Status Survey: Is the patient deaf or have difficulty hearing?: No Does the patient have difficulty seeing, even when wearing glasses/contacts?: No Does the patient have difficulty concentrating, remembering, or making decisions?: No Does the patient have difficulty walking or climbing stairs?: No Does the patient have difficulty dressing or bathing?: No Does the patient have difficulty doing errands alone such as visiting  a doctor's office or shopping?: No    Assessment & Plan    1. Encounter for routine history and physical exam for male  Discussed importance of 150 minutes of physical activity weekly, eat two servings of fish weekly, eat one serving of tree nuts ( cashews, pistachios, pecans, almonds.Marland Kitchen.) every other day, eat 6 servings of fruit/vegetables daily and drink plenty of water and avoid sweet beverages.   2. 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)  - Hemoglobin A1c  3. BPH (benign prostatic hyperplasia)  - PSA  4. Hypogonadism male  - CBC with Differential/Platelet - Testosterone  5. Long-term use of high-risk medication  - CBC with Differential/Platelet  6. Need for vaccination for Strep pneumoniae  - Pneumococcal conjugate vaccine 13-valent IM

## 2015-10-01 LAB — CBC WITH DIFFERENTIAL/PLATELET
BASOS: 1 %
Basophils Absolute: 0.1 10*3/uL (ref 0.0–0.2)
EOS (ABSOLUTE): 0.3 10*3/uL (ref 0.0–0.4)
EOS: 4 %
HEMATOCRIT: 43.9 % (ref 37.5–51.0)
HEMOGLOBIN: 15.5 g/dL (ref 12.6–17.7)
Immature Grans (Abs): 0.1 10*3/uL (ref 0.0–0.1)
Immature Granulocytes: 1 %
LYMPHS ABS: 2.2 10*3/uL (ref 0.7–3.1)
Lymphs: 33 %
MCH: 30.5 pg (ref 26.6–33.0)
MCHC: 35.3 g/dL (ref 31.5–35.7)
MCV: 86 fL (ref 79–97)
MONOCYTES: 9 %
Monocytes Absolute: 0.6 10*3/uL (ref 0.1–0.9)
NEUTROS ABS: 3.5 10*3/uL (ref 1.4–7.0)
Neutrophils: 52 %
Platelets: 213 10*3/uL (ref 150–379)
RBC: 5.09 x10E6/uL (ref 4.14–5.80)
RDW: 12.6 % (ref 12.3–15.4)
WBC: 6.6 10*3/uL (ref 3.4–10.8)

## 2015-10-01 LAB — PSA: Prostate Specific Ag, Serum: 0.5 ng/mL (ref 0.0–4.0)

## 2015-10-01 LAB — HEMOGLOBIN A1C
ESTIMATED AVERAGE GLUCOSE: 146 mg/dL
HEMOGLOBIN A1C: 6.7 % — AB (ref 4.8–5.6)

## 2015-10-01 LAB — TESTOSTERONE: Testosterone: 355 ng/dL (ref 348–1197)

## 2015-10-17 ENCOUNTER — Other Ambulatory Visit: Payer: Self-pay | Admitting: Family Medicine

## 2015-10-17 NOTE — Telephone Encounter (Signed)
Patient requesting refill. 

## 2015-10-23 DIAGNOSIS — E119 Type 2 diabetes mellitus without complications: Secondary | ICD-10-CM | POA: Diagnosis not present

## 2015-10-24 ENCOUNTER — Encounter: Payer: Self-pay | Admitting: Family Medicine

## 2015-10-24 ENCOUNTER — Ambulatory Visit (INDEPENDENT_AMBULATORY_CARE_PROVIDER_SITE_OTHER): Payer: 59 | Admitting: Family Medicine

## 2015-10-24 VITALS — BP 112/64 | HR 74 | Temp 98.6°F | Resp 18 | Ht 74.0 in | Wt 231.6 lb

## 2015-10-24 DIAGNOSIS — N521 Erectile dysfunction due to diseases classified elsewhere: Secondary | ICD-10-CM

## 2015-10-24 DIAGNOSIS — J069 Acute upper respiratory infection, unspecified: Secondary | ICD-10-CM | POA: Diagnosis not present

## 2015-10-24 DIAGNOSIS — H938X2 Other specified disorders of left ear: Secondary | ICD-10-CM | POA: Diagnosis not present

## 2015-10-24 DIAGNOSIS — E1049 Type 1 diabetes mellitus with other diabetic neurological complication: Secondary | ICD-10-CM | POA: Diagnosis not present

## 2015-10-24 LAB — POCT URINALYSIS DIPSTICK
BILIRUBIN UA: NEGATIVE
GLUCOSE UA: NEGATIVE
Ketones, UA: NEGATIVE
Leukocytes, UA: NEGATIVE
Nitrite, UA: NEGATIVE
Protein, UA: NEGATIVE
RBC UA: NEGATIVE
SPEC GRAV UA: 1.02
UROBILINOGEN UA: NEGATIVE
pH, UA: 6.5

## 2015-10-24 NOTE — Progress Notes (Signed)
Name: Ivan Booker   MRN: [id number]    DOB: 1966-08-17   Date:10/24/2015       Progress Note  Subjective  Chief Complaint  Chief Complaint  Patient presents with  . paperwork    HPI  URI: he states that over the past couple of weeks he has cold symptoms. Initially , sinus pressure, hoarseness, post-nasal drip, also a cough. He had some chills for a couple of days. He took some otc medication, feeling better, but left ear fells clogged up. Appetite has been good  DMI: he states his glucose is still a little higher post-prandially in the pm, in the 200's, he has been having to take a few extra units when that happens, advised to adjust the dose before dinner by 3-5 units and monitor.    Patient Active Problem List   Diagnosis Date Noted  . Erectile dysfunction 06/26/2015  . BPH (benign prostatic hyperplasia) 03/20/2015  . 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 Dyslipidemia 03/07/2015  . Hypogonadism male 03/07/2015  . Obesity (BMI 30.0-34.9) 03/07/2015  . Allergic rhinitis, seasonal 03/07/2015  . Vitamin D deficiency 03/07/2015    Past Surgical History  Procedure Laterality Date  . Vasectomy      History reviewed. No pertinent family history.  Social History   Social History  . Marital Status: Married    Spouse Name: N/A  . Number of Children: N/A  . Years of Education: N/A   Occupational History  . Not on file.   Social History Main Topics  . Smoking status: Never Smoker   . Smokeless tobacco: Never Used  . Alcohol Use: Yes     Comment: 1-2 beers on weekends  . Drug Use: No  . Sexual Activity: Yes    Birth Control/ Protection: None   Other Topics Concern  . Not on file   Social History Narrative     Current outpatient prescriptions:  .  aspirin 81 MG chewable tablet, Chew 1 tablet by mouth daily., Disp: , Rfl:  .  atorvastatin (LIPITOR) 20 MG tablet, Take 1 tablet (20 mg total) by mouth daily., Disp:  90 tablet, Rfl: 2 .  B-D ULTRAFINE III SHORT PEN 31G X 8 MM MISC, , Disp: , Rfl: 5 .  Cholecalciferol (VITAMIN D) 2000 UNITS CAPS, Take 1 capsule by mouth daily., Disp: , Rfl:  .  CIALIS 5 MG tablet, TAKE 1 TABLET BY MOUTH ONCE DAILY, Disp: 30 tablet, Rfl: 4 .  GLUCOSE BLOOD VI, , Disp: , Rfl:  .  HUMALOG KWIKPEN 100 UNIT/ML KiwkPen, INJECT 5 TO 10 UNITS SUBCUTANEOUSLY BEFORE MEALS, Disp: 45 mL, Rfl: 2 .  LANTUS SOLOSTAR 100 UNIT/ML Solostar Pen, INJECT 50 UNITS SUBCUTANEOUSLY DAILY, Disp: 45 mL, Rfl: 2 .  lisinopril (PRINIVIL,ZESTRIL) 5 MG tablet, TAKE 1 TABLET BY MOUTH ONCE DAILY, Disp: 90 tablet, Rfl: 1 .  Testosterone 12.5 MG/ACT (1%) GEL, APPLY 4 PUMPS ONCE DAILY AS DIRECTED, Disp: 450 g, Rfl: 1  No Known Allergies   ROS  Ten systems reviewed and is negative except as mentioned in HPI   Objective  Filed Vitals:   10/24/15 1351  BP: 112/64  Pulse: 74  Temp: 98.6 F (37 C)  Resp: 18  Height:  (1.88 m)  Weight: 231 lb 9 oz (105.036 kg)  SpO2: 97%    Body mass index is 29.72 kg/(m^2).  Physical Exam  Constitutional: Patient appears well-developed and well-nourished. No distress.  HEENT: head  atraumatic, normocephalic, pupils equal and reactive to light, ears TM normal bilaterally, neck supple, throat within normal limits Cardiovascular: Normal rate, regular rhythm and normal heart sounds.  No murmur heard. No BLE edema. Pulmonary/Chest: Effort normal and breath sounds normal. No respiratory distress. Abdominal: Soft.  There is no tenderness. Psychiatric: Patient has a normal mood and affect. behavior is normal. Judgment and thought content normal.  Recent Results (from the past 2160 hour(s))  CBC with Differential/Platelet     Status: None   Collection Time: 09/30/15  2:46 PM  Result Value Ref Range   WBC 6.6 3.4 - 10.8 x10E3/uL   RBC 5.09 4.14 - 5.80 x10E6/uL   Hemoglobin 15.5 12.6 - 17.7 g/dL   Hematocrit 21.3 08.6 - 51.0 %   MCV 86 79 - 97 fL   MCH 30.5  26.6 - 33.0 pg   MCHC 35.3 31.5 - 35.7 g/dL   RDW 57.8 46.9 - 62.9 %   Platelets 213 150 - 379 x10E3/uL   Neutrophils 52 %   Lymphs 33 %   Monocytes 9 %   Eos 4 %   Basos 1 %   Neutrophils Absolute 3.5 1.4 - 7.0 x10E3/uL   Lymphocytes Absolute 2.2 0.7 - 3.1 x10E3/uL   Monocytes Absolute 0.6 0.1 - 0.9 x10E3/uL   EOS (ABSOLUTE) 0.3 0.0 - 0.4 x10E3/uL   Basophils Absolute 0.1 0.0 - 0.2 x10E3/uL   Immature Granulocytes 1 %   Immature Grans (Abs) 0.1 0.0 - 0.1 x10E3/uL  Hemoglobin A1c     Status: Abnormal   Collection Time: 09/30/15  2:46 PM  Result Value Ref Range   Hgb A1c MFr Bld 6.7 (H) 4.8 - 5.6 %    Comment:          Pre-diabetes: 5.7 - 6.4          Diabetes: >6.4          Glycemic control for adults with diabetes: <7.0    Est. average glucose Bld gHb Est-mCnc 146 mg/dL  Testosterone     Status: None   Collection Time: 09/30/15  2:46 PM  Result Value Ref Range   Testosterone 355 348 - 1197 ng/dL   Comment, Testosterone Comment     Comment: Adult male reference interval is based on a population of lean males up to 52 years old.   PSA     Status: None   Collection Time: 09/30/15  2:46 PM  Result Value Ref Range   Prostate Specific Ag, Serum 0.5 0.0 - 4.0 ng/mL    Comment: Roche ECLIA methodology. According to the American Urological Association, Serum PSA should decrease and remain at undetectable levels after radical prostatectomy. The AUA defines biochemical recurrence as an initial PSA value 0.2 ng/mL or greater followed by a subsequent confirmatory PSA value 0.2 ng/mL or greater. Values obtained with different assay methods or kits cannot be used interchangeably. Results cannot be interpreted as absolute evidence of the presence or absence of malignant disease.     PHQ2/9: Depression screen [facility] 2/9 09/30/2015 06/20/2015 03/07/2015  Decreased Interest 0 0 0  Down, Depressed, Hopeless 0 0 0  PHQ - 2 Score 0 0 0    Fall Risk: Fall Risk  09/30/2015 06/20/2015  03/07/2015  Falls in the past year? Yes Yes Yes  Number falls in past yr: Injury with Fall? Yes Yes Yes  Follow up - Falls prevention discussed -    Hearing Screening   Method: Audiometry          Right ear:   Pass Pass Pass Pass   Left ear:   Pass Pass Pass Pass     Visual Acuity Screening   Right eye Left eye Both eyes  Without correction:  With correction:         Assessment & Plan  1. Upper respiratory infection  Doing better, advised saline spray  2. Ear fullness, left  Advised a nasal spray  3. 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)  Adjust pre-dinner insulin  - POCT Urinalysis Dipstick Filled DOT ( PCP ) forms also

## 2015-10-29 ENCOUNTER — Other Ambulatory Visit: Payer: Self-pay | Admitting: Family Medicine

## 2015-10-29 NOTE — Telephone Encounter (Signed)
Patient requesting refill. 

## 2015-12-19 ENCOUNTER — Other Ambulatory Visit: Payer: Self-pay | Admitting: Pharmacist

## 2015-12-19 ENCOUNTER — Encounter: Payer: Self-pay | Admitting: Pharmacist

## 2015-12-19 NOTE — Patient Outreach (Signed)
[place] [facility]) Care Management  [place]   12/19/2015  VICK FILTER 1966-07-14 [id number]  Subjective: Patient presents today for 3 month diabetes follow-up as part of the employer-sponsored Link to Wellness program.  Patient was diagnosed with Type I diabetes in ~1996.  Current diabetes regimen includes Lantus 34 units and Humalog sliding scale.  Patient also continues on daily ASA, ACE Inhibitor and statin.  Most recent MD follow-up was 10/24/15.  Patient has a pending appt for 12/29/15.    Patient reports he is currently playing golf approximately 6 times per week.  He continues to follow low carbohydrate diet and continues to count his carbs   Objective:  Blood glucose average:  7- day = 148 mg/dL 14-day = 167 mg/dL 30-day = 167 mg/dL  A1c = 6.7% on 09/30/15  Current Medications: Current Outpatient Prescriptions  Medication Sig Dispense Refill  . aspirin 81 MG chewable tablet Chew 1 tablet by mouth daily.    Marland Kitchen atorvastatin (LIPITOR) 20 MG tablet Take 1 tablet (20 mg total) by mouth daily. 90 tablet 2  . B-D ULTRAFINE III SHORT PEN 31G X 8 MM MISC   5  . Cholecalciferol (VITAMIN D) 2000 UNITS CAPS Take 1 capsule by mouth daily.    Marland Kitchen CIALIS 5 MG tablet TAKE 1 TABLET BY MOUTH ONCE DAILY 30 tablet 4  . GLUCOSE BLOOD VI     . HUMALOG KWIKPEN 100 UNIT/ML KiwkPen INJECT 5 TO 10 UNITS SUBCUTANEOUSLY BEFORE MEALS 45 mL 2  . LANTUS SOLOSTAR 100 UNIT/ML Solostar Pen INJECT 50 UNITS SUBCUTANEOUSLY DAILY (Patient taking differently: INJECT 34 UNITS SUBCUTANEOUSLY DAILY) 45 mL 2  . lisinopril (PRINIVIL,ZESTRIL) 5 MG tablet TAKE 1 TABLET BY MOUTH ONCE DAILY 90 tablet 1  . Testosterone 12.5 MG/ACT (1%) GEL APPLY 4 PUMPS ONCE DAILY AS DIRECTED 450 g 1   No current facility-administered medications for this visit.   Functional Status: In your present state of health, do you have any difficulty performing the following activities: 09/30/2015 06/20/2015  Hearing? N N  Vision?  N N  Difficulty concentrating or making decisions? N N  Walking or climbing stairs? N N  Dressing or bathing? N N  Doing errands, shopping? N N   Fall/Depression Screening: PHQ 2/9 Scores 09/30/2015 06/20/2015 03/07/2015  PHQ - 2 Score 0 0 0    Assessment: 1.  Diabetes:  Most recent A1C was 6.7% which is at goal of less than 7%. Weight is stable from last visit with me.  Patient's 7-day blood glucose average was 148 mg/dL, which is improved from 14 and 30-day averages of 167 mg/dL.  Patient reports one episode of hypoglycemia approximately 10 days ago.  Patient confirms he has hypoglycemic awareness and reports appropriate hypoglycemic treatment.  Patient states he keeps glucose tablets with him with he drives and when he plays golf.  He reports adherence with his medications and reports following a diabetic diet.  Patient appears motivated to maintain A1c <7%.     Plan: 1.  Patient will continue to take all medications as prescribed and monitor BG before meals and at bedtime.   2.  Encouraged patient to continue to exercise at least 150 minutes weekly.   3.  Follow up 03/19/16 at 2:00 PM.     [facility] CM Care Plan Problem One        Most Recent Value   Care Plan Problem One  Diabetes   Role Documenting the Problem One  Clinical Pharmacist  Care Plan for Problem One  Active   [facility] Long Term Goal (31-90 days)  Patient will maintain A1c less than 7%   [facility] Long Term Goal Start Date  12/19/15   [facility] Long Term Goal Met Date  -- [goal renewed]   Interventions for Problem One Long Term Goal  Reviewed diet and exercise recommendations including 150 minutes of exercise per week.       Rachel Henderson, Pharm.D. Pharmacy Resident [facility] [phone]  

## 2015-12-29 ENCOUNTER — Ambulatory Visit (INDEPENDENT_AMBULATORY_CARE_PROVIDER_SITE_OTHER): Payer: 59 | Admitting: Family Medicine

## 2015-12-29 ENCOUNTER — Encounter: Payer: Self-pay | Admitting: Family Medicine

## 2015-12-29 VITALS — BP 116/72 | HR 67 | Temp 98.6°F | Resp 14 | Wt 234.2 lb

## 2015-12-29 DIAGNOSIS — N521 Erectile dysfunction due to diseases classified elsewhere: Secondary | ICD-10-CM

## 2015-12-29 DIAGNOSIS — E1049 Type 1 diabetes mellitus with other diabetic neurological complication: Secondary | ICD-10-CM | POA: Diagnosis not present

## 2015-12-29 DIAGNOSIS — E291 Testicular hypofunction: Secondary | ICD-10-CM

## 2015-12-29 DIAGNOSIS — N400 Enlarged prostate without lower urinary tract symptoms: Secondary | ICD-10-CM

## 2015-12-29 DIAGNOSIS — E785 Hyperlipidemia, unspecified: Secondary | ICD-10-CM | POA: Diagnosis not present

## 2015-12-29 LAB — POCT UA - MICROALBUMIN: MICROALBUMIN (UR) POC: NEGATIVE mg/L

## 2015-12-29 LAB — POCT GLYCOSYLATED HEMOGLOBIN (HGB A1C): HEMOGLOBIN A1C: 6.7

## 2015-12-29 MED ORDER — TESTOSTERONE 12.5 MG/ACT (1%) TD GEL: TRANSDERMAL | Status: DC

## 2015-12-29 NOTE — Progress Notes (Signed)
Name: Ivan Booker   MRN: [id number]    DOB: [birth year]   Date:12/29/2015       Progress Note  Subjective  Chief Complaint  Chief Complaint  Patient presents with  . Diabetes    patient checks his BS regularly. highest: close to 400 or a little bit over. lowest: maybe in the 50's  . Benign Prostatic Hypertrophy  . Hypogonadism    HPI  DMI with ED: he is doing well . Last hypoglycemia was down to the 50's a couple of weeks ago. He is having some high glucose levels in the 300's post-prandially but he brings it down with insulin . Otherwise feeling well, average 140's, his past 30 days was in the 160's . No polyphagia, polyuria or polydipsia. EC is well controlled with Cialis- not back to baseline but is better. Taking ace for kidney protection  BPH: used to see Urologist - Dr. Lonna Cobb - years ago, had normal biopsies in the past. Taking Cialis and is doing well. He has nocturia once per night, no dribbling.   Hypogonadism: he has been on testosterone for about 5 years, using 2 instead of 4 pumps daily because it takes a long time to dry. Last level was still low, but no fatigue, no weight gain. He wants to continue on current dose.   Hyperlipidemia: at goal, taking Atorvastatin daily, no cramping or chest pain   Patient Active Problem List   Diagnosis Date Noted  . Erectile dysfunction 06/26/2015  . BPH (benign prostatic hyperplasia) 03/20/2015  . 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 Dyslipidemia 03/07/2015  . Hypogonadism male 03/07/2015  . Obesity (BMI 30.0-34.9) 03/07/2015  . Allergic rhinitis, seasonal 03/07/2015  . Vitamin D deficiency 03/07/2015    Past Surgical History  Procedure Laterality Date  . Vasectomy      History reviewed. No pertinent family history.  Social History   Social History  . Marital Status: Married    Spouse Name: N/A  . Number of Children: N/A  . Years of Education: N/A   Occupational  History  . Not on file.   Social History Main Topics  . Smoking status: Never Smoker   . Smokeless tobacco: Never Used  . Alcohol Use: Yes     Comment: 1-2 beers on weekends  . Drug Use: No  . Sexual Activity: Yes    Birth Control/ Protection: None   Other Topics Concern  . Not on file   Social History Narrative     Current outpatient prescriptions:  .  aspirin 81 MG chewable tablet, Chew 1 tablet by mouth daily., Disp: , Rfl:  .  atorvastatin (LIPITOR) 20 MG tablet, Take 1 tablet (20 mg total) by mouth daily., Disp: 90 tablet, Rfl: 2 .  B-D ULTRAFINE III SHORT PEN 31G X 8 MM MISC, , Disp: , Rfl: 5 .  Cholecalciferol (VITAMIN D) 2000 UNITS CAPS, Take 1 capsule by mouth daily., Disp: , Rfl:  .  CIALIS 5 MG tablet, TAKE 1 TABLET BY MOUTH ONCE DAILY, Disp: 30 tablet, Rfl: 4 .  GLUCOSE BLOOD VI, , Disp: , Rfl:  .  HUMALOG KWIKPEN 100 UNIT/ML KiwkPen, INJECT 5 TO 10 UNITS SUBCUTANEOUSLY BEFORE MEALS, Disp: 45 mL, Rfl: 2 .  LANTUS SOLOSTAR 100 UNIT/ML Solostar Pen, INJECT 50 UNITS SUBCUTANEOUSLY DAILY (Patient taking differently: INJECT 34 UNITS SUBCUTANEOUSLY DAILY), Disp: 45 mL, Rfl: 2 .  lisinopril (PRINIVIL,ZESTRIL) 5 MG tablet, TAKE 1 TABLET BY MOUTH ONCE  DAILY, Disp: 90 tablet, Rfl: 1 .  Testosterone 12.5 MG/ACT (1%) GEL, APPLY 4 PUMPS ONCE DAILY AS DIRECTED, Disp: 450 g, Rfl: 1  No Known Allergies   ROS  Constitutional: Negative for fever or weight change.  Respiratory: Negative for cough and shortness of breath.   Cardiovascular: Negative for chest pain or palpitations.  Gastrointestinal: Negative for abdominal pain, no bowel changes.  Musculoskeletal: Negative for gait problem or joint swelling.  Skin: Negative for rash.  Neurological: Negative for dizziness or headache.  No other specific complaints in a complete review of systems (except as listed in HPI above).  Objective  Filed Vitals:   12/29/15 1458  BP: 116/72  Pulse: 67  Temp: 98.6 F (37 C)  TempSrc:  Oral  Resp: 14  Weight: 234 lb 3.2 oz (106.232 kg)  SpO2: 96%    Body mass index is 30.06 kg/(m^2).  Physical Exam  Constitutional: Patient appears well-developed and well-nourished. No distress.  HEENT: head atraumatic, normocephalic, pupils equal and reactive to light,  neck supple, throat within normal limits Cardiovascular: Normal rate, regular rhythm and normal heart sounds.  No murmur heard. No BLE edema. Pulmonary/Chest: Effort normal and breath sounds normal. No respiratory distress. Abdominal: Soft.  There is no tenderness. Psychiatric: Patient has a normal mood and affect. behavior is normal. Judgment and thought content normal.  Recent Results (from the past 2160 hour(s))  POCT Urinalysis Dipstick     Status: None   Collection Time: 10/24/15  1:58 PM  Result Value Ref Range   Color, UA yellow    Clarity, UA clear    Glucose, UA neg    Bilirubin, UA neg    Ketones, UA neg    Spec Grav, UA 1.020    Blood, UA neg    pH, UA 6.5    Protein, UA neg    Urobilinogen, UA negative    Nitrite, UA neg    Leukocytes, UA Negative Negative  POCT glycosylated hemoglobin (Hb A1C)     Status: Abnormal   Collection Time: 12/29/15  2:50 PM  Result Value Ref Range   Hemoglobin A1C 6.7   POCT UA - Microalbumin     Status: Normal   Collection Time: 12/29/15  2:51 PM  Result Value Ref Range   Microalbumin Ur, POC NEGATIVE mg/L   Creatinine, POC  mg/dL   Albumin/Creatinine Ratio, Urine, POC       PHQ2/9: Depression screen [facility] 2/9 12/29/2015 09/30/2015 06/20/2015 03/07/2015  Decreased Interest 0 0 0 0  Down, Depressed, Hopeless 0 0 0 0  PHQ - 2 Score 0 0 0 0     Fall Risk: Fall Risk  12/29/2015 12/19/2015 09/30/2015 06/20/2015 03/07/2015  Falls in the past year? No No Yes Yes Yes  Number falls in past yr: - - Injury with Fall? - - Yes Yes Yes  Follow up - - - Falls prevention discussed -     Functional Status Survey: Is the patient deaf or have difficulty hearing?: No Does  the patient have difficulty seeing, even when wearing glasses/contacts?: No Does the patient have difficulty concentrating, remembering, or making decisions?: No Does the patient have difficulty walking or climbing stairs?: No Does the patient have difficulty dressing or bathing?: No Does the patient have difficulty doing errands alone such as visiting a doctor's office or shopping?: No    Assessment & Plan  1. 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)  Continue medication ,  hgbA1C is at goal  - POCT glycosylated hemoglobin (Hb A1C) - POCT UA - Microalbumin  2. Dyslipidemia  Taking medication as prescription  3. Hypogonadism male  - Testosterone 12.5 MG/ACT (1%) GEL; APPLY 4 PUMPS ONCE DAILY AS DIRECTED  Dispense: 450 g; Refill: 1 1  4. BPH (benign prostatic hyperplasia)  Seen by Dr. Lonna CobbStoioff in am's

## 2015-12-29 NOTE — Patient Instructions (Signed)
How and Where to Give Subcutaneous Insulin Injections, Adult  People with type 1 diabetes must take insulin since their bodies do not make it. People with type 2 diabetes may require insulin. There are many different types of insulin as well as other injectable diabetes medicines that are meant to be injected into the fat layer under your skin. The type of insulin or injectable diabetes medicine you take may determine how many injections you give yourself and when to take the injections.   CHOOSING A SITE FOR INJECTION  Insulin absorption varies from site to site. As with any injectable medication it is best for the insulin to be injected within the same body region. However, do not inject the insulin in the same spot each time. Rotating the spots you give your injections will prevent inflammation or tissue breakdown. There are four main regions that can be used for injections. The regions include the:   Abdomen (preferred region, especially for non-insulin injectable diabetes medicine).   Front and upper outer sides of thighs.   Back of upper arm.   Buttocks.  USING A SYRINGE AND VIAL  Drawing up insulin: single insulin dose  1. Wash your hands with soap and water.  2. Gently roll the insulin bottle (vial) between your hands to mix it. Do not shake the vial.  3. Clean the top rubber part of the vial with an alcohol wipe. Be sure that the plastic pop-top has been removed on newer vials.  4. Remove the plastic cover from the needle on the syringe. Do not let the needle touch anything.  5. Pull the plunger back to draw air into the syringe. The air should be the same amount as the insulin dose.  6. Push the needle through the rubber on the top of the vial. Do not turn the vial over.  7. Push the plunger in all the way to put the air into the vial.  8. Leave the needle in the vial and turn the vial and syringe upside down.  9. Pull down slowly on the plunger, drawing the amount of insulin you need into the  syringe.  10. Look for air bubbles in the syringe. You may need to push the plunger up and down 2 to 3 times to slowly get rid of any air bubbles in the syringe.  11. Pull back the plunger to get your correct dose.  12. Remove the needle from the vial.  13. Use an alcohol wipe to clean the area of the body to be injected.  14. Pinch up 1 inch of skin and hold it.  15. Put the needle straight into the skin (90-degree angle). Put the needle in as far as it will go (to the hub). The needle may need to be injected at a 45-degree angle in small adults with little fat.  16. When the needle is in, you can let go of your skin.  17. Push the plunger down all the way to inject the insulin.  18. Pull the needle straight out of the skin.  19. Press the alcohol wipe over the spot where you gave your injection. Keep it there for a few seconds. Do not rub the area.  20. Do not put the plastic cover back on the needle.  Drawing up insulin: mixing 2 insulins  1. Wash your hands with soap and water.  2. Gently roll the vial of "cloudy" insulin between your hands or rotate the vial from top to bottom to   mix.  3. Clean the top of both vials with an alcohol wipe. Be sure that the plastic pop-top lid has been removed on newer vials.  4. Pull air into the syringe to equal the dose of "cloudy" insulin.  5. Stick the needle into the "cloudy" insulin vial and inject the air. Be sure to keep the vial upright.  6. Remove the needle from the "cloudy" insulin vial.  7. Pull air into the syringe to equal the dose of "clear" insulin.  8. Stick the needle into the "clear" insulin vial and inject the air.  9. Leave the needle in the "clear" insulin vial and turn the vial upside down.  10. Pull down on the plunger and slowly draw into the syringe the number of units of "clear" insulin desired.  11. Look for air bubbles in the syringe. You may need to push the plunger up and down 2 to 3 times to slowly get rid of any air bubbles in the  syringe.  12. Remove the needle from the "clear" insulin vial.  13. Stick the needle into the "cloudy" insulin vial. Do not inject any of the "clear" insulin into the "cloudy" vial.  14. Turn the "cloudy" vial upside down and pull the plunger down to the number of units that equals the total number of units of "clear" and "cloudy" insulins.  15. Remove the needle from the "cloudy" insulin vial.  16. Use an alcohol wipe to clean the area of the body to be injected.  17. Put the needle straight into the skin (90-degree angle). Put the needle in as far as it will go (to the hub). The needle may need to be injected at a 45-degree angle in small adults with little fat.  18. When the needle is in, you can let go of your skin.  19. Push the plunger down all the way to inject the insulin.  20. Pull the needle straight out of the skin.  21. Press the alcohol wipe over the spot where you gave your injection. Keep it there for a few seconds. Do not rub the area.  22. Do not put the plastic cover back on the needle.  USING INSULIN PENS  1. Wash your hands with soap and water.  2. If you are using the "cloudy" insulin, roll the pen between your palms several times or rotate the pen top to bottom several times.  3. Remove the insulin pen cap.  4. Clean the rubber stopper of the cartridge with an alcohol wipe.  5. Remove the protective paper tab from the disposable needle.  6. Screw the needle onto the pen.  7. Remove the outer plastic needle cover.  8. Remove the inner plastic needle cover.  9. Prime the insulin pen by turning the button (dial) to 2 units. Hold the pen with the needle pointing up, and push the dial on the opposite end until a drop of insulin appears at the needle tip. If no insulin appears, repeat this step.  10. Dial the number of units of insulin you will inject.  11. Use an alcohol wipe to clean the area of the body to be injected.  12. Pinch up 1 inch of skin and hold it.  13. Put the needle straight into the  skin (90-degree angle).  14. Push the dial down to push the insulin into the fat tissue.  15. Count to 10 slowly. Then, remove the needle from the fat tissue.  16. Carefully replace the   larger outer plastic needle cover over the needle and unscrew the capped needle.  THROWING AWAY SUPPLIES   Discard used needles in a puncture proof sharps disposal container. Follow disposal regulations for the area where you live.   Vials and empty disposable pens may be thrown away in the regular trash.     This information is not intended to replace advice given to you by your health care provider. Make sure you discuss any questions you have with your health care provider.     Document Released: 12/04/2003 Document Revised: 10/04/2014 Document Reviewed: 02/20/2013  Elsevier Interactive Patient Education 2016 [employer]

## 2016-02-06 ENCOUNTER — Other Ambulatory Visit: Payer: Self-pay | Admitting: Family Medicine

## 2016-03-08 ENCOUNTER — Other Ambulatory Visit: Payer: Self-pay | Admitting: Family Medicine

## 2016-03-08 NOTE — Telephone Encounter (Signed)
Patient requesting refill. 

## 2016-03-19 ENCOUNTER — Other Ambulatory Visit: Payer: Self-pay | Admitting: Pharmacist

## 2016-03-19 NOTE — Patient Outreach (Signed)
[facility]) Care Management  [place]   03/19/2016  Ivan Booker 09/16/1966 [id number]  Subjective: Patient presents today for 3 month diabetes follow-up as part of the employer-sponsored Link to Wellness program.  Patient was diagnosed with Type I diabetes approximately 20 years ago.  Current diabetes regimen includes Lantus 34 units daily and Humalog sliding scale.  Patient also continues on daily ASA, ACE Inhibitor and statin.  Most recent MD follow-up was 12/29/15.  Patient has a pending appt for 04/05/16.  No med changes or major health changes at this time.   Patient reports he continues to play golf 3-4 times per week and is out working around the yard at his house almost every day.  He continues to follow low carbohydrate diet and continues to count his carbs.    Objective:   Encounter Medications: Outpatient Encounter Prescriptions as of 03/19/2016  Medication Sig Note  . aspirin 81 MG chewable tablet Chew 1 tablet by mouth daily. 03/07/2015: DX: 250.01 Received from: [facility]: ASPIRIN, '81MG'$  (Oral Tablet Chewable)  1 po qday for 0 days  Quantity: 30.00;  Refills: 0   Ordered :04-Sep-2010  Steele Sizer MD;  Started 13-Nov-2007 Active Comments: DX  . atorvastatin (LIPITOR) 20 MG tablet Take 1 tablet (20 mg total) by mouth daily.   . B-D ULTRAFINE III SHORT PEN 31G X 8 MM MISC USE AS DIRECTED   . Cholecalciferol (VITAMIN D) 2000 UNITS CAPS Take 1 capsule by mouth daily. 03/07/2015: Received from: [place]: Take by mouth.  . CIALIS 5 MG tablet TAKE 1 TABLET BY MOUTH ONCE DAILY   . HUMALOG KWIKPEN 100 UNIT/ML KiwkPen INJECT 5 TO 10 UNITS SUBCUTANEOUSLY BEFORE MEALS 12/19/2015: Reports using sliding scale   . LANTUS SOLOSTAR 100 UNIT/ML Solostar Pen INJECT 50 UNITS SUBCUTANEOUSLY DAILY (Patient taking differently: INJECT 34 UNITS SUBCUTANEOUSLY DAILY)   . lisinopril (PRINIVIL,ZESTRIL) 5 MG tablet  TAKE 1 TABLET BY MOUTH ONCE DAILY   . ONE TOUCH ULTRA TEST test strip USE 4 TIMES A DAY AS DIRECTED   . Testosterone 12.5 MG/ACT (1%) GEL APPLY 4 PUMPS ONCE DAILY AS DIRECTED    No facility-administered encounter medications on file as of 03/19/2016.   Functional Status: In your present state of health, do you have any difficulty performing the following activities: 12/29/2015 09/30/2015  Hearing? N N  Vision? N N  Difficulty concentrating or making decisions? N N  Walking or climbing stairs? N N  Dressing or bathing? N N  Doing errands, shopping? N N   Fall/Depression Screening: PHQ 2/9 Scores 12/29/2015 09/30/2015 06/20/2015 03/07/2015  PHQ - 2 Score 0 0 0 0    Assessment: 1.  Diabetes:  Most recent A1C was 6.7% which is at goal of less than 7%. Weight is stable from last visit with me.  Patient's 7-day blood glucose average was 164 mg/dL, his 14-day average was 167 mg/dL, and 30-day average was 174 mg/dL.  Patient reports monitoring his blood glucose 4-5 times daily.  He monitors twice before meals and twice after meals; therefore, averages on patient's meter includes both pre and post prandial readings.  Patient reports occasional hypoglycemia with hypoglycemic awareness.  Patient reports appropriate hypoglycemic treatment.  Patient appears motivated to maintain current diet and activity level.    Plan: 1.  Patient will continue to take all medications as prescribed.  2.  Encouraged patient to continue to follow diabetic diet and exercise at least 150  minutes weekly.   3.  Next visit on 07/30/16.    [facility] CM Care Plan Problem One        Most Recent Value   Care Plan Problem One  Diabetes   Role Documenting the Problem One  Clinical Pharmacist   Care Plan for Problem One  Active   [facility] Long Term Goal (31-90 days)  Patient will continue to follow diabetic diet >75% of the time over the next 90 days per patient report.    [facility] Long Term Goal Start Date  03/19/16   [facility] Long Term Goal Met Date   03/19/16   Interventions for Problem One Long Term Goal  Discussed importance of diet in diabetes management.  Patient will continue to follow diabetic diet and count carbohydrates.     [facility] CM Care Plan Problem Two        Most Recent Value   Care Plan Problem Two  Exercise   Role Documenting the Problem Two  Clinical Pharmacist   Care Plan for Problem Two  Active   Interventions for Problem Two Long Term Goal   Discussed importance of exercise in diabetes management.  Reviewed ADA goal to exercise at least 150 minutes per week.    [facility] Long Term Goal (31-90) days  Patient will maintain activity level of > 150 minutes per week over the next 90 days per patient report.    [facility] Long Term Goal Start Date  03/19/16     Elisabeth Most, Pharm.D. Pharmacy Resident [place] [phone]

## 2016-04-05 ENCOUNTER — Ambulatory Visit (INDEPENDENT_AMBULATORY_CARE_PROVIDER_SITE_OTHER): Payer: 59 | Admitting: Family Medicine

## 2016-04-05 ENCOUNTER — Encounter: Payer: Self-pay | Admitting: Family Medicine

## 2016-04-05 VITALS — BP 96/54 | HR 60 | Temp 98.8°F | Resp 16 | Ht 74.0 in | Wt 234.2 lb

## 2016-04-05 DIAGNOSIS — E785 Hyperlipidemia, unspecified: Secondary | ICD-10-CM

## 2016-04-05 DIAGNOSIS — E1069 Type 1 diabetes mellitus with other specified complication: Secondary | ICD-10-CM

## 2016-04-05 DIAGNOSIS — E559 Vitamin D deficiency, unspecified: Secondary | ICD-10-CM | POA: Diagnosis not present

## 2016-04-05 DIAGNOSIS — N521 Erectile dysfunction due to diseases classified elsewhere: Secondary | ICD-10-CM

## 2016-04-05 DIAGNOSIS — E291 Testicular hypofunction: Secondary | ICD-10-CM | POA: Diagnosis not present

## 2016-04-05 DIAGNOSIS — Z79899 Other long term (current) drug therapy: Secondary | ICD-10-CM

## 2016-04-05 DIAGNOSIS — E1049 Type 1 diabetes mellitus with other diabetic neurological complication: Secondary | ICD-10-CM

## 2016-04-05 DIAGNOSIS — N400 Enlarged prostate without lower urinary tract symptoms: Secondary | ICD-10-CM

## 2016-04-05 LAB — POCT GLYCOSYLATED HEMOGLOBIN (HGB A1C): Hemoglobin A1C: 6.9

## 2016-04-05 MED ORDER — INSULIN DEGLUDEC 200 UNIT/ML ~~LOC~~ SOPN: 34.0000 [IU] | PEN_INJECTOR | Freq: Every day | SUBCUTANEOUS | Status: DC

## 2016-04-05 NOTE — Progress Notes (Signed)
Name: Ivan Booker   MRN: [id number]    DOB: [birth year]   Date:04/05/2016       Progress Note  Subjective  Chief Complaint  Chief Complaint  Patient presents with  . Medication Refill    3 month F/U  . Diabetes    Patient checks BS 4-5 daily, Low-60 Average-160's High-350. Patient states he has been trying to watch what he is eating but has more high's than lows.   . Hyperlipidemia  . Hypertension    HPI  DMI with ED: he is doing well . Last hypoglycemia was about one week ago, down to 60, he states not having very low's lately. He is having some high glucose levels in the 300's post-prandially but he brings it down with insulin. Average 160's. He states he can't eat rice or potatoes because it cause glucose to go high for a couple of days. No polyphagia, polyuria or polydipsia. EC is well controlled with Cialis- not back to baseline but is better. Taking ace for kidney protection. Discussed changing to [name], and be more strict with his diet  BPH: used to see Urologist - Dr. Lonna CobbStoioff - years ago, had normal biopsies in the past. Taking Cialis and is doing well. He has nocturia once per night, no dribbling.   Hypogonadism: he has been on testosterone for about 5 years, using 2 instead of 4 pumps daily because it takes a long time to dry. He denies fatigue, no weight gain. He wants to continue on current dose. No change in mood.  Hyperlipidemia: at goal, taking Atorvastatin daily, no cramping or chest pain    Patient Active Problem List   Diagnosis Date Noted  . Erectile dysfunction 06/26/2015  . BPH (benign prostatic hyperplasia) 03/20/2015  . 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 Dyslipidemia 03/07/2015  . Hypogonadism male 03/07/2015  . Obesity (BMI 30.0-34.9) 03/07/2015  . Allergic rhinitis, seasonal 03/07/2015  . Vitamin D deficiency 03/07/2015    Past Surgical History  Procedure Laterality Date  . Vasectomy       History reviewed. No pertinent family history.  Social History   Social History  . Marital Status: Married    Spouse Name: N/A  . Number of Children: N/A  . Years of Education: N/A   Occupational History  . Not on file.   Social History Main Topics  . Smoking status: Never Smoker   . Smokeless tobacco: Never Used  . Alcohol Use: Yes     Comment: 1-2 beers on weekends  . Drug Use: No  . Sexual Activity: Yes    Birth Control/ Protection: None   Other Topics Concern  . Not on file   Social History Narrative     Current outpatient prescriptions:  .  aspirin 81 MG chewable tablet, Chew 1 tablet by mouth daily., Disp: , Rfl:  .  atorvastatin (LIPITOR) 20 MG tablet, Take 1 tablet (20 mg total) by mouth daily., Disp: 90 tablet, Rfl: 2 .  B-D ULTRAFINE III SHORT PEN 31G X 8 MM MISC, USE AS DIRECTED, Disp: 100 each, Rfl: 5 .  Cholecalciferol (VITAMIN D) 2000 UNITS CAPS, Take 1 capsule by mouth daily., Disp: , Rfl:  .  CIALIS 5 MG tablet, TAKE 1 TABLET BY MOUTH ONCE DAILY, Disp: 30 tablet, Rfl: 4 .  HUMALOG KWIKPEN 100 UNIT/ML KiwkPen, INJECT 5 TO 10 UNITS SUBCUTANEOUSLY BEFORE MEALS, Disp: 45 mL, Rfl: 2 .  lisinopril (PRINIVIL,ZESTRIL) 5 MG tablet, TAKE  1 TABLET BY MOUTH ONCE DAILY, Disp: 90 tablet, Rfl: 1 .  ONE TOUCH ULTRA TEST test strip, USE 4 TIMES A DAY AS DIRECTED, Disp: 500 each, Rfl: 3 .  Testosterone 12.5 MG/ACT (1%) GEL, APPLY 4 PUMPS ONCE DAILY AS DIRECTED, Disp: 450 g, Rfl: 1 .  Insulin Degludec (TRESIBA FLEXTOUCH) 200 UNIT/ML SOPN, Inject 34 Units into the skin daily., Disp: 9 mL, Rfl: 2  No Known Allergies   ROS  Constitutional: Negative for fever or weight change.  Respiratory: Negative for cough and shortness of breath.   Cardiovascular: Negative for chest pain or palpitations.  Gastrointestinal: Negative for abdominal pain, no bowel changes.  Musculoskeletal: Negative for gait problem or joint swelling.  Skin: Negative for rash.  Neurological:  Negative for dizziness or headache.  No other specific complaints in a complete review of systems (except as listed in HPI above).  Objective  Filed Vitals:   04/05/16 1439  BP: 96/54  Pulse: 60  Temp: 98.8 F (37.1 C)  TempSrc: Oral  Resp: 16  Height:  (1.88 m)  Weight: 234 lb 3.2 oz (106.232 kg)  SpO2: 97%    Body mass index is 30.06 kg/(m^2).  Physical Exam  Constitutional: Patient appears well-developed and well-nourished.  No distress.  HEENT: head atraumatic, normocephalic, pupils equal and reactive to light,  neck supple, throat within normal limits Cardiovascular: Normal rate, regular rhythm and normal heart sounds.  No murmur heard. No BLE edema. Pulmonary/Chest: Effort normal and breath sounds normal. No respiratory distress. Abdominal: Soft.  There is no tenderness. Psychiatric: Patient has a normal mood and affect. behavior is normal. Judgment and thought content normal.  Recent Results (from the past 2160 hour(s))  POCT HgB A1C     Status: None   Collection Time: 04/05/16  2:41 PM  Result Value Ref Range   Hemoglobin A1C 6.9     Diabetic Foot Exam: Diabetic Foot Exam - Simple   Simple Foot Form  Diabetic Foot exam was performed with the following findings:  Yes 04/05/2016  3:12 PM  Visual Inspection  No deformities, no ulcerations, no other skin breakdown bilaterally:  Yes  Sensation Testing  Intact to touch and monofilament testing bilaterally:  Yes  Pulse Check  Posterior Tibialis and Dorsalis pulse intact bilaterally:  Yes  Comments       PHQ2/9: Depression screen [facility] 2/9 04/05/2016 12/29/2015 09/30/2015 06/20/2015 03/07/2015  Decreased Interest 0 0 0 0 0  Down, Depressed, Hopeless 0 0 0 0 0  PHQ - 2 Score 0 0 0 0 0     Fall Risk: Fall Risk  04/05/2016 12/29/2015 12/19/2015 09/30/2015 06/20/2015  Falls in the past year? No No No Yes Yes  Number falls in past yr: - - - 1 1  Injury with Fall? - - - Yes Yes  Follow up - - - - Falls prevention  discussed     Functional Status Survey: Is the patient deaf or have difficulty hearing?: No Does the patient have difficulty seeing, even when wearing glasses/contacts?: No Does the patient have difficulty concentrating, remembering, or making decisions?: No Does the patient have difficulty walking or climbing stairs?: No Does the patient have difficulty dressing or bathing?: No Does the patient have difficulty doing errands alone such as visiting a doctor's office or shopping?: No    Assessment & Plan  1. 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)  - POCT HgB A1C - Insulin Degludec (TRESIBA FLEXTOUCH) 200 UNIT/ML  SOPN; Inject 34 Units into the skin daily.  Dispense: 9 mL; Refill: 2  2. Dyslipidemia  Continue statin   3. BPH (benign prostatic hyperplasia)  Stable   4. Hypogonadism male  stable  5. Diabetic erectile dysfunction associated with type 1 diabetes mellitus (HCC)  Continue medications

## 2016-04-05 NOTE — Addendum Note (Signed)
Addended by: Alba CorySOWLES, Wren Pryce F on: 04/05/2016 03:26 PM   Modules accepted: Orders

## 2016-04-06 ENCOUNTER — Other Ambulatory Visit: Payer: Self-pay | Admitting: Family Medicine

## 2016-04-06 DIAGNOSIS — E785 Hyperlipidemia, unspecified: Secondary | ICD-10-CM | POA: Diagnosis not present

## 2016-04-06 DIAGNOSIS — E559 Vitamin D deficiency, unspecified: Secondary | ICD-10-CM | POA: Diagnosis not present

## 2016-04-06 DIAGNOSIS — Z79899 Other long term (current) drug therapy: Secondary | ICD-10-CM | POA: Diagnosis not present

## 2016-04-07 LAB — COMPREHENSIVE METABOLIC PANEL
A/G RATIO: 1.6 (ref 1.2–2.2)
ALT: 20 IU/L (ref 0–44)
AST: 19 IU/L (ref 0–40)
Albumin: 3.9 g/dL (ref 3.5–5.5)
Alkaline Phosphatase: 78 IU/L (ref 39–117)
BUN/Creatinine Ratio: 15 (ref 9–20)
BUN: 15 mg/dL (ref 6–24)
Bilirubin Total: 1 mg/dL (ref 0.0–1.2)
CALCIUM: 9 mg/dL (ref 8.7–10.2)
CHLORIDE: 100 mmol/L (ref 96–106)
CO2: 25 mmol/L (ref 18–29)
Creatinine, Ser: 1.03 mg/dL (ref 0.76–1.27)
GFR calc Af Amer: 96 mL/min/{1.73_m2} (ref >59)
GFR, EST NON AFRICAN AMERICAN: 83 mL/min/{1.73_m2} (ref >59)
GLUCOSE: 116 mg/dL — AB (ref 65–99)
Globulin, Total: 2.4 g/dL (ref 1.5–4.5)
POTASSIUM: 4.3 mmol/L (ref 3.5–5.2)
Sodium: 140 mmol/L (ref 134–144)
Total Protein: 6.3 g/dL (ref 6.0–8.5)

## 2016-04-07 LAB — LIPID PANEL
CHOL/HDL RATIO: 2.9 ratio (ref 0.0–5.0)
Cholesterol, Total: 154 mg/dL (ref 100–199)
HDL: 54 mg/dL (ref >39)
LDL Calculated: 89 mg/dL (ref 0–99)
TRIGLYCERIDES: 55 mg/dL (ref 0–149)
VLDL Cholesterol Cal: 11 mg/dL (ref 5–40)

## 2016-04-07 LAB — VITAMIN D 25 HYDROXY (VIT D DEFICIENCY, FRACTURES): VIT D 25 HYDROXY: 40.4 ng/mL (ref 30.0–100.0)

## 2016-04-29 ENCOUNTER — Telehealth: Payer: Self-pay | Admitting: Family Medicine

## 2016-04-29 NOTE — Telephone Encounter (Signed)
Please call pharmacy and authorize early fill if needed. Thank you

## 2016-04-30 NOTE — Telephone Encounter (Signed)
Notified the pharmacy and they are filling it today for the patient.

## 2016-07-05 ENCOUNTER — Other Ambulatory Visit: Payer: Self-pay | Admitting: Family Medicine

## 2016-07-05 NOTE — Telephone Encounter (Signed)
Patient requesting refill of Cialis to [facility]. 

## 2016-07-12 ENCOUNTER — Ambulatory Visit (INDEPENDENT_AMBULATORY_CARE_PROVIDER_SITE_OTHER): Payer: 59 | Admitting: Family Medicine

## 2016-07-12 ENCOUNTER — Encounter: Payer: Self-pay | Admitting: Family Medicine

## 2016-07-12 VITALS — BP 98/62 | HR 64 | Temp 98.2°F | Resp 16 | Ht 74.0 in | Wt 238.1 lb

## 2016-07-12 DIAGNOSIS — E1049 Type 1 diabetes mellitus with other diabetic neurological complication: Secondary | ICD-10-CM | POA: Diagnosis not present

## 2016-07-12 DIAGNOSIS — R350 Frequency of micturition: Secondary | ICD-10-CM

## 2016-07-12 DIAGNOSIS — N401 Enlarged prostate with lower urinary tract symptoms: Secondary | ICD-10-CM | POA: Diagnosis not present

## 2016-07-12 DIAGNOSIS — E785 Hyperlipidemia, unspecified: Secondary | ICD-10-CM

## 2016-07-12 DIAGNOSIS — E291 Testicular hypofunction: Secondary | ICD-10-CM | POA: Diagnosis not present

## 2016-07-12 DIAGNOSIS — E559 Vitamin D deficiency, unspecified: Secondary | ICD-10-CM

## 2016-07-12 DIAGNOSIS — Z23 Encounter for immunization: Secondary | ICD-10-CM

## 2016-07-12 DIAGNOSIS — N521 Erectile dysfunction due to diseases classified elsewhere: Secondary | ICD-10-CM

## 2016-07-12 LAB — POCT GLYCOSYLATED HEMOGLOBIN (HGB A1C): Hemoglobin A1C: 6.8

## 2016-07-12 MED ORDER — INSULIN LISPRO 100 UNIT/ML (KWIKPEN): 5.0000 [IU] | PEN_INJECTOR | Freq: Four times a day (QID) | SUBCUTANEOUS | 2 refills | Status: DC

## 2016-07-12 NOTE — Progress Notes (Signed)
Name: Ivan Booker   MRN: [id number]    DOB: [birth year]   Date:07/12/2016       Progress Note  Subjective  Chief Complaint  Chief Complaint  Patient presents with  . Diabetes    157 avg 60 low 300 high  . Hyperlipidemia  . Flu Vaccine    HPI  DMI with ED: he is doing well . Episodes of hypoglycemia at most twice a month, usually during the day - after exercising- went down to 60 . He states average 157, high of 300 ( usually after dinner ), but usually no higher than 200.  No polyphagia, polyuria or polydipsia. EC is well controlled with Cialis- not back to baseline but is better. Taking ace for kidney protection. Ivan Booker seems to be helping the fasting levels, between 80-120's, usually 115 range.   BPH: used to see Urologist - Dr. Lonna Booker - years ago, had normal biopsies in the past. Taking Cialis and is doing well. He has nocturia once per night, no dribbling.   Hypogonadism: he has been on testosterone for about 5 years, using 2 instead of 4 pumps daily because it takes a long time to dry. He denies fatigue, he gained 4 lbs since last visit . He wants to continue on current dose. No change in mood.  Hyperlipidemia: at goal, taking Atorvastatin daily, no cramping or chest pain     Patient Active Problem List   Diagnosis Date Noted  . Erectile dysfunction 06/26/2015  . BPH (benign prostatic hyperplasia) 03/20/2015  . 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 Dyslipidemia 03/07/2015  . Hypogonadism male 03/07/2015  . Obesity (BMI 30.0-34.9) 03/07/2015  . Allergic rhinitis, seasonal 03/07/2015  . Vitamin D deficiency 03/07/2015    Past Surgical History:  Procedure Laterality Date  . VASECTOMY      History reviewed. No pertinent family history.  Social History   Social History  . Marital status: Married    Spouse name: N/A  . Number of children: N/A  . Years of education: N/A   Occupational History  . Not on file.    Social History Main Topics  . Smoking status: Never Smoker  . Smokeless tobacco: Never Used  . Alcohol use Yes     Comment: 1-2 beers on weekends  . Drug use: No  . Sexual activity: Yes    Birth control/ protection: None   Other Topics Concern  . Not on file   Social History Narrative  . No narrative on file     Current Outpatient Prescriptions:  .  aspirin 81 MG chewable tablet, Chew 1 tablet by mouth daily., Disp: , Rfl:  .  atorvastatin (LIPITOR) 20 MG tablet, TAKE 1 TABLET (20 MG TOTAL) BY MOUTH DAILY., Disp: 90 tablet, Rfl: 2 .  B-D ULTRAFINE III SHORT PEN 31G X 8 MM MISC, USE AS DIRECTED, Disp: 100 each, Rfl: 5 .  Cholecalciferol (VITAMIN D) 2000 UNITS CAPS, Take 1 capsule by mouth daily., Disp: , Rfl:  .  CIALIS 5 MG tablet, TAKE 1 TABLET BY MOUTH ONCE DAILY, Disp: 30 tablet, Rfl: 4 .  Insulin Degludec (TRESIBA FLEXTOUCH) 200 UNIT/ML SOPN, Inject 34 Units into the skin daily., Disp: 9 mL, Rfl: 2 .  insulin lispro (HUMALOG KWIKPEN) 100 UNIT/ML KiwkPen, Inject 0.05-0.16 mLs (5-16 Units total) into the skin 4 (four) times daily., Disp: 45 mL, Rfl: 2 .  lisinopril (PRINIVIL,ZESTRIL) 5 MG tablet, TAKE 1 TABLET BY MOUTH ONCE DAILY,  Disp: 90 tablet, Rfl: 1 .  ONE TOUCH ULTRA TEST test strip, USE 4 TIMES A DAY AS DIRECTED, Disp: 500 each, Rfl: 3 .  Testosterone 12.5 MG/ACT (1%) GEL, APPLY 4 PUMPS ONCE DAILY AS DIRECTED, Disp: 450 g, Rfl: 1  No Known Allergies   ROS  Constitutional: Negative for fever or weight change.  Respiratory: Negative for cough and shortness of breath.   Cardiovascular: Negative for chest pain or palpitations.  Gastrointestinal: Negative for abdominal pain, no bowel changes.  Musculoskeletal: Negative for gait problem or joint swelling.  Skin: Negative for rash.  Neurological: Negative for dizziness or headache.  No other specific complaints in a complete review of systems (except as listed in HPI above).  Objective  Vitals:   07/12/16 1332   BP: 98/62  Pulse: 64  Resp: 16  Temp: 98.2 F (36.8 C)  SpO2: 98%  Weight: 238 lb 2 oz (108 kg)  Height: 6\' 2"  (1.88 m)    Body mass index is 30.57 kg/m.  Physical Exam  Constitutional: Patient appears well-developed and well-nourished.  No distress.  HEENT: head atraumatic, normocephalic, pupils equal and reactive to light,  neck supple, throat within normal limits Cardiovascular: Normal rate, regular rhythm and normal heart sounds.  No murmur heard. No BLE edema. Pulmonary/Chest: Effort normal and breath sounds normal. No respiratory distress. Abdominal: Soft.  There is no tenderness. Psychiatric: Patient has a normal mood and affect. behavior is normal. Judgment and thought content normal.  PHQ2/9: Depression screen [facility] 07/12/2016 04/05/2016 12/29/2015 09/30/2015 06/20/2015  Decreased Interest 0 0 0 0 0  Down, Depressed, Hopeless 0 0 0 0 0  PHQ - 2 Score 0 0 0 0 0     Fall Risk: Fall Risk  07/12/2016 04/05/2016 12/29/2015 12/19/2015 09/30/2015  Falls in the past year? No No No No Yes  Number falls in past yr: - - - - 1  Injury with Fall? - - - - Yes  Follow up - - - - -     Functional Status Survey: Is the patient deaf or have difficulty hearing?: No Does the patient have difficulty seeing, even when wearing glasses/contacts?: No Does the patient have difficulty concentrating, remembering, or making decisions?: No Does the patient have difficulty walking or climbing stairs?: No Does the patient have difficulty dressing or bathing?: No Does the patient have difficulty doing errands alone such as visiting a doctor's office or shopping?: No    Assessment & Plan  1. 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)  - POCT HgB A1C - insulin lispro (HUMALOG KWIKPEN) 100 UNIT/ML KiwkPen; Inject 0.05-0.16 mLs (5-16 Units total) into the skin 4 (four) times daily.  Dispense: 45 mL; Refill: 2  2. Dyslipidemia  Doing well on Atorvastatin  3.  Vitamin D deficiency  Continue supplementation   4. Need for influenza vaccination  - Flu Vaccine QUAD 36+ mos PF IM (Fluarix & Fluzone Quad PF)  5. Benign prostatic hyperplasia with urinary frequency  Continue medication   6. Hypogonadism male  Continue testosterone

## 2016-07-26 ENCOUNTER — Other Ambulatory Visit: Payer: Self-pay | Admitting: Family Medicine

## 2016-07-26 DIAGNOSIS — E291 Testicular hypofunction: Secondary | ICD-10-CM

## 2016-07-26 NOTE — Telephone Encounter (Signed)
[facility]

## 2016-07-30 ENCOUNTER — Other Ambulatory Visit: Payer: Self-pay | Admitting: Pharmacist

## 2016-07-30 ENCOUNTER — Encounter: Payer: Self-pay | Admitting: Pharmacist

## 2016-07-30 NOTE — Patient Outreach (Signed)
[facility]) Care Management  [facility]   07/30/2016  Ivan BailiffMarty N Booker [birth year] [id number]  Subjective: Patient presents today for diabetes follow-up as part of the employer-sponsored Link to Wellness program.  Current diabetes regimen includes Tresiba 34 units daily and Humalog sliding scale.  Patient also continues on daily aspirin, lisinopril and atorvastatin.  Most recent MD follow-up was 07/12/16 with Dr Carlynn PurlSowles.  Patient has a pending appt for 08/24/16 with Dr Carlynn PurlSowles. Reports improved blood glucose since switching to [place]iba.   Patient reported dietary habits: Eats 3 meals/day.  Reports is trying to stay low carbohydrate 50-75% of the time.  Breakfast: Mcdonalds McMuffin Lunch: Salad with Crackers Supper: Largest Meal: Grilled chicken, pork chops, steak, vegetables salds.  Drinks:Water, diet soft drinks, occasional gatorade with golfing   Patient reported exercise habits: Golfing 5-6 times per week usually 9 holes for 60-90 minutes   Patient reports hypoglycemic events in the high 50 to low 60s usually after exercising/golfing.  Reports occurrence around 2 times/month. Reports keeping Gatorade with him while golfing to treat lows.  Patient reports nocturia 1 time per night.  Patient denies pain/burning upon urination.  Patient denies neuropathy. Patient denies visual changes. Patient reports self foot exams. Denies changes  Patient reported self monitored blood glucose frequency 5-6 times per day  7 day average 159 14 day average 150 30 day average 153  Objective:  Lab Results  Component Value Date   HGBA1C 6.8 07/12/2016   There were no vitals filed for this visit.  Lipid Panel     Component Value Date/Time   CHOL 154 04/06/2016 0858   TRIG 55 04/06/2016 0858   HDL 54 04/06/2016 0858   CHOLHDL 2.9 04/06/2016 0858   LDLCALC 89 04/06/2016 0858     Encounter Medications: Outpatient Encounter Prescriptions as of 07/30/2016  Medication Sig   . aspirin 81 MG chewable tablet Chew 1 tablet by mouth daily.  Marland Kitchen. atorvastatin (LIPITOR) 20 MG tablet TAKE 1 TABLET (20 MG TOTAL) BY MOUTH DAILY.  Marland Kitchen. B-D ULTRAFINE III SHORT PEN 31G X 8 MM MISC USE AS DIRECTED  . Cholecalciferol (VITAMIN D) 2000 UNITS CAPS Take 1 capsule by mouth daily.  Marland Kitchen. CIALIS 5 MG tablet TAKE 1 TABLET BY MOUTH ONCE DAILY  . Insulin Degludec (TRESIBA FLEXTOUCH) 200 UNIT/ML SOPN Inject 34 Units into the skin daily.  . insulin lispro (HUMALOG KWIKPEN) 100 UNIT/ML KiwkPen Inject 0.05-0.16 mLs (5-16 Units total) into the skin 4 (four) times daily.  Marland Kitchen. lisinopril (PRINIVIL,ZESTRIL) 5 MG tablet TAKE 1 TABLET BY MOUTH ONCE DAILY  . ONE TOUCH ULTRA TEST test strip USE 4 TIMES A DAY AS DIRECTED  . Testosterone 12.5 MG/ACT (1%) GEL APPLY 4 PUMPS ONCE DAILY AS DIRECTED   No facility-administered encounter medications on file as of 07/30/2016.     Functional Status: In your present state of health, do you have any difficulty performing the following activities: 07/30/2016 07/12/2016  Hearing? N N  Vision? - N  Difficulty concentrating or making decisions? - N  Walking or climbing stairs? - N  Dressing or bathing? - N  Doing errands, shopping? - N  Some recent data might be hidden    Fall/Depression Screening: PHQ 2/9 Scores 07/30/2016 07/12/2016 04/05/2016 12/29/2015 09/30/2015 06/20/2015 03/07/2015  PHQ - 2 Score 0 0 0 0 0 0 0     Assessment:  Diabetes: Most recent A1C was 6.8% which is at goal of less than 7%.   Plan/Goals for Next Visit:  Counseled on signs/symptoms and treatment of hypoglycemia.   May start walking golf course in addition to maintaining his activity/excercise >150 minutes per week. Discussed low carbohydrate diet and exercise education Patient will continue to take medications as prescribed   Next appointment to see me is: 3 months via telephone   Hazle NordmannKelsy Combs, PharmD [facility] PGY2 Pharmacy Resident [phone]  [facility] Problem One   Flowsheet  Row Most Recent Value  Care Plan Problem One  Diabetes  Role Documenting the Problem One  Clinical Pharmacist  Care Plan for Problem One  Active  [facility] Long Term Goal (31-90 days)  Patient will continue to follow diabetic diet >75% of the time over the next 90 days per patient report.   [facility] Long Term Goal Start Date  03/19/16  Interventions for Problem One Long Term Goal  Discussed importance of diet in diabetes management.  Patient will continue to follow diabetic diet and count carbohydrates.     [facility] Care [facility] Problem Two   Flowsheet Row Most Recent Value  Care Plan Problem Two  Exercise  Role Documenting the Problem Two  Clinical Pharmacist  Care Plan for Problem Two  Active  Interventions for Problem Two Long Term Goal   Discussed importance of exercise in diabetes management.  Reviewed ADA goal to exercise at least 150 minutes per week. and walking on the golf course.   [facility] Long Term Goal (31-90) days  Patient will maintain activity level of > 150 minutes per week over the next 90 days per patient report.  [facility] Long Term Goal Start Date  03/19/16

## 2016-08-12 DIAGNOSIS — E119 Type 2 diabetes mellitus without complications: Secondary | ICD-10-CM | POA: Diagnosis not present

## 2016-08-12 LAB — HM DIABETES EYE EXAM

## 2016-08-18 ENCOUNTER — Telehealth: Payer: Self-pay | Admitting: Family Medicine

## 2016-08-24 ENCOUNTER — Encounter: Payer: Self-pay | Admitting: Family Medicine

## 2016-08-24 ENCOUNTER — Ambulatory Visit (INDEPENDENT_AMBULATORY_CARE_PROVIDER_SITE_OTHER): Payer: 59 | Admitting: Family Medicine

## 2016-08-24 VITALS — BP 98/72 | HR 72 | Temp 98.3°F | Resp 16 | Ht 73.5 in | Wt 234.2 lb

## 2016-08-24 DIAGNOSIS — J069 Acute upper respiratory infection, unspecified: Secondary | ICD-10-CM

## 2016-08-24 DIAGNOSIS — N521 Erectile dysfunction due to diseases classified elsewhere: Secondary | ICD-10-CM

## 2016-08-24 DIAGNOSIS — B9789 Other viral agents as the cause of diseases classified elsewhere: Secondary | ICD-10-CM | POA: Diagnosis not present

## 2016-08-24 DIAGNOSIS — E1049 Type 1 diabetes mellitus with other diabetic neurological complication: Secondary | ICD-10-CM

## 2016-08-24 LAB — POCT URINALYSIS DIPSTICK
Bilirubin, UA: NEGATIVE
Blood, UA: NEGATIVE
Glucose, UA: NEGATIVE
KETONES UA: NEGATIVE
Leukocytes, UA: NEGATIVE
Nitrite, UA: NEGATIVE
PROTEIN UA: NEGATIVE
SPEC GRAV UA: 1.01
Urobilinogen, UA: NEGATIVE
pH, UA: 6

## 2016-08-24 NOTE — Addendum Note (Signed)
Addended by: Phineas SemenJOHNSON, Lenox Bink L on: 08/24/2016 10:01 AM   Modules accepted: Orders

## 2016-08-24 NOTE — Progress Notes (Signed)
Name: Ivan BailiffMarty N Bradstreet   MRN: [id number]    DOB: [birth year]   Date:08/24/2016       Progress Note  Subjective  Chief Complaint  Chief Complaint  Patient presents with  . 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 Mild cough from post-nasal drainage. He feels well now, sleeping well with Benadryl. Taking Mucinex Sinus Max and feels good even though he states his voice is a little different. No fever, no chills or change in appetite  DMI: he came today to have DMV forms filled out. No episodes of hypoglycemia that required assistance in over 3 years, his glucose is well controlled. He has an episode of mild hypoglycemia less than once a month, he has awareness and is able to have a snack when needed.    Patient Active Problem List   Diagnosis Date Noted  . Erectile dysfunction 06/26/2015  . BPH (benign prostatic hyperplasia) 03/20/2015  . 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 Dyslipidemia 03/07/2015  . Hypogonadism male 03/07/2015  . Obesity (BMI 30.0-34.9) 03/07/2015  . Allergic rhinitis, seasonal 03/07/2015  . Vitamin D deficiency 03/07/2015    Past Surgical History:  Procedure Laterality Date  . VASECTOMY      No family history on file.  Social History   Social History  . Marital status: Married    Spouse name: N/A  . Number of children: N/A  . Years of education: N/A   Occupational History  . Not on file.   Social History Main Topics  . Smoking status: Never Smoker  . Smokeless tobacco: Never Used  . Alcohol use Yes     Comment: 1-2 beers on weekends  . Drug use: No  . Sexual activity: Yes    Birth control/ protection: None   Other Topics Concern  . Not on file   Social History Narrative  . No narrative on file     Current Outpatient Prescriptions:  .  aspirin 81 MG chewable tablet, Chew 1 tablet by mouth  daily., Disp: , Rfl:  .  atorvastatin (LIPITOR) 20 MG tablet, TAKE 1 TABLET (20 MG TOTAL) BY MOUTH DAILY., Disp: 90 tablet, Rfl: 2 .  B-D ULTRAFINE III SHORT PEN 31G X 8 MM MISC, USE AS DIRECTED, Disp: 100 each, Rfl: 5 .  Cholecalciferol (VITAMIN D) 2000 UNITS CAPS, Take 1 capsule by mouth daily., Disp: , Rfl:  .  CIALIS 5 MG tablet, TAKE 1 TABLET BY MOUTH ONCE DAILY, Disp: 30 tablet, Rfl: 4 .  Insulin Degludec (TRESIBA FLEXTOUCH) 200 UNIT/ML SOPN, Inject 34 Units into the skin daily., Disp: 9 mL, Rfl: 2 .  insulin lispro (HUMALOG KWIKPEN) 100 UNIT/ML KiwkPen, Inject 0.05-0.16 mLs (5-16 Units total) into the skin 4 (four) times daily., Disp: 45 mL, Rfl: 2 .  lisinopril (PRINIVIL,ZESTRIL) 5 MG tablet, TAKE 1 TABLET BY MOUTH ONCE DAILY, Disp: 90 tablet, Rfl: 1 .  ONE TOUCH ULTRA TEST test strip, USE 4 TIMES A DAY AS DIRECTED, Disp: 500 each, Rfl: 3 .  Testosterone 12.5 MG/ACT (1%) GEL, APPLY 4 PUMPS ONCE DAILY AS DIRECTED, Disp: 450 g, Rfl: 1  No Known Allergies   ROS  Ten systems reviewed and is negative except as mentioned in HPI   Objective  Vitals:   08/24/16 0901  BP: 98/72  Pulse: 72  Resp: 16  Temp:  98.3 F (36.8 C)  TempSrc: Oral  SpO2: 95%  Weight: 234 lb 4 oz (106.3 kg)  Height: 6' 1.5" (1.867 m)    Body mass index is 30.49 kg/m.  Physical Exam  Constitutional: Patient appears well-developed and well-nourished. No distress.  HEENT: head atraumatic, normocephalic, pupils equal and reactive to light, ears TM normal bilaterally,  neck supple, throat within normal limits Cardiovascular: Normal rate, regular rhythm and normal heart sounds.  No murmur heard. No BLE edema. Pulmonary/Chest: Effort normal and breath sounds normal. No respiratory distress. Abdominal: Soft.  There is no tenderness. Psychiatric: Patient has a normal mood and affect. behavior is normal. Judgment and thought content normal. Neurological exam: cranial nerve negative, normal sensation, strength  and rom  Recent Results (from the past 2160 hour(s))  POCT HgB A1C     Status: None   Collection Time: 07/12/16  2:05 PM  Result Value Ref Range   Hemoglobin A1C 6.8      PHQ2/9: Depression screen [facility] 08/24/2016 07/30/2016 07/12/2016 04/05/2016 12/29/2015  Decreased Interest 0 0 0 0 0  Down, Depressed, Hopeless 0 0 0 0 0  PHQ - 2 Score 0 0 0 0 0     Fall Risk: Fall Risk  08/24/2016 07/30/2016 07/12/2016 04/05/2016 12/29/2015  Falls in the past year? No No No No No  Number falls in past yr: - - - - -  Injury with Fall? - - - - -  Follow up - - - - -    Functional Status Survey: Is the patient deaf or have difficulty hearing?: No Does the patient have difficulty seeing, even when wearing glasses/contacts?: No Does the patient have difficulty concentrating, remembering, or making decisions?: No Does the patient have difficulty walking or climbing stairs?: No Does the patient have difficulty dressing or bathing?: No Does the patient have difficulty doing errands alone such as visiting a doctor's office or shopping?: No    Assessment & Plan  1. 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)  Doing well, DMV forms filled out  2. Viral upper respiratory tract infection  Continue otc medication

## 2016-09-15 ENCOUNTER — Other Ambulatory Visit: Payer: Self-pay | Admitting: Family Medicine

## 2016-09-15 DIAGNOSIS — E1049 Type 1 diabetes mellitus with other diabetic neurological complication: Secondary | ICD-10-CM

## 2016-09-15 DIAGNOSIS — N521 Erectile dysfunction due to diseases classified elsewhere: Principal | ICD-10-CM

## 2016-09-15 NOTE — Telephone Encounter (Signed)
[facility]

## 2016-10-04 ENCOUNTER — Telehealth: Payer: Self-pay | Admitting: Family Medicine

## 2016-10-04 NOTE — Telephone Encounter (Signed)
Pt wants to know if he can get generic of Cialis if that is possible.

## 2016-10-05 NOTE — Telephone Encounter (Signed)
Likely from insurance changes.

## 2016-10-05 NOTE — Telephone Encounter (Signed)
I don't think Cialis is generic yet. There is a generic Viagra

## 2016-10-05 NOTE — Telephone Encounter (Signed)
Patient is going to try the coupon for Cialis and let us know the cost. Patient states it went from 25 a month to $150.

## 2016-10-06 ENCOUNTER — Telehealth: Payer: Self-pay | Admitting: Family Medicine

## 2016-10-06 NOTE — Telephone Encounter (Signed)
Pt was given a copay card for cialis but when they had gotten to the pharmacy they realized that the card was expired. Was told by Dr Carlynn PurlSowles that she would write a prescription for viagra or something else (generic brand) because cialis has went up in price (was paying $15 but now its over $100).

## 2016-10-06 NOTE — Telephone Encounter (Signed)
The prescription he has can be changed to generic, it has both names on his prescription. [facility] and tell them if needed

## 2016-10-06 NOTE — Telephone Encounter (Signed)
Online According to Cialis Website: As of May 27, 2016, the [employer] Card program has been discontinued. Existing cards printed or distributed prior to May 27, 2016 will be honored until the expiration date printed on the card. So patient wants to know if you can change it to the generic brand of something else for ED. Thanks

## 2016-10-07 ENCOUNTER — Other Ambulatory Visit: Payer: Self-pay

## 2016-10-07 DIAGNOSIS — N528 Other male erectile dysfunction: Secondary | ICD-10-CM

## 2016-10-07 MED ORDER — SILDENAFIL CITRATE 20 MG PO TABS: 20.0000 mg | ORAL_TABLET | Freq: Every day | ORAL | 2 refills | Status: DC | PRN

## 2016-10-07 NOTE — Telephone Encounter (Signed)
Patient states he is [facility] the Viagra 20 mg # 50- Take 2-5 tablets as needed for sexual activity for $100. Please print out prescription and patient will be by today to pick up rx. Thanks

## 2016-10-07 NOTE — Telephone Encounter (Signed)
Spoke with Morrie SheldonAshley at [facility] and she states that if Cialis is Generic that this is so new they don't have it even available or offered to our pharmacy yet. Morrie Sheldonshley also states it depends on what health plan they picked if it was the choice or high deducible. Because Choice should be a generic co-pay but if high deducible even the generic Cialis once available will still be costly.  Please advise patient would like a prescription today if all possible. Thanks

## 2016-10-12 ENCOUNTER — Other Ambulatory Visit: Payer: Self-pay

## 2016-10-12 MED ORDER — SILDENAFIL CITRATE 25 MG PO TABS
25.0000 mg | ORAL_TABLET | Freq: Every day | ORAL | 2 refills | Status: DC | PRN
Start: 2016-10-12 — End: 2017-04-08

## 2016-10-15 ENCOUNTER — Encounter: Payer: 59 | Admitting: Family Medicine

## 2016-10-20 ENCOUNTER — Other Ambulatory Visit: Payer: Self-pay | Admitting: Family Medicine

## 2016-10-20 NOTE — Telephone Encounter (Signed)
[facility]

## 2016-10-27 ENCOUNTER — Ambulatory Visit (INDEPENDENT_AMBULATORY_CARE_PROVIDER_SITE_OTHER): Payer: 59

## 2016-10-27 DIAGNOSIS — E1049 Type 1 diabetes mellitus with other diabetic neurological complication: Secondary | ICD-10-CM

## 2016-10-27 DIAGNOSIS — N521 Erectile dysfunction due to diseases classified elsewhere: Secondary | ICD-10-CM | POA: Diagnosis not present

## 2016-10-27 LAB — POCT GLYCOSYLATED HEMOGLOBIN (HGB A1C): Hemoglobin A1C: 6.8

## 2016-10-28 ENCOUNTER — Ambulatory Visit (INDEPENDENT_AMBULATORY_CARE_PROVIDER_SITE_OTHER): Payer: 59 | Admitting: Family Medicine

## 2016-10-28 ENCOUNTER — Encounter: Payer: Self-pay | Admitting: Family Medicine

## 2016-10-28 VITALS — BP 102/63 | HR 72 | Temp 98.1°F | Resp 16 | Wt 235.0 lb

## 2016-10-28 DIAGNOSIS — E559 Vitamin D deficiency, unspecified: Secondary | ICD-10-CM

## 2016-10-28 DIAGNOSIS — E1049 Type 1 diabetes mellitus with other diabetic neurological complication: Secondary | ICD-10-CM

## 2016-10-28 DIAGNOSIS — N521 Erectile dysfunction due to diseases classified elsewhere: Secondary | ICD-10-CM

## 2016-10-28 DIAGNOSIS — E785 Hyperlipidemia, unspecified: Secondary | ICD-10-CM

## 2016-10-28 DIAGNOSIS — E291 Testicular hypofunction: Secondary | ICD-10-CM | POA: Diagnosis not present

## 2016-10-28 NOTE — Progress Notes (Signed)
Name: Ivan Booker   MRN: [id number]    DOB: 10-06-1965   Date:10/28/2016       Progress Note  Subjective  Chief Complaint  Chief Complaint  Patient presents with  . Diabetes  . Hyperlipidemia  . Benign Prostatic Hypertrophy  . Hypogonadism  . Forms    patient needs his form completed for his job    HPI  DMI with ED: he is doing well . Episodes of hypoglycemia at most twice a month, usually during the day when he is playing golf. He can tell when sugar is dropping. Last low glucose was 62 when he was done exercising. Glucose fasting:  No polyphagia, polyuria or polydipsia. ED is okay with Viagra ( instead of Cialis ).  Taking ace for kidney protection. [name] seems to be helping the fasting levels. He is compliant with his medication, logging his glucose daily and exercise. He was diagnosed with DM as an young adult ( mid-20's ) and has good knowledge of his condition.  He brought his glucose monitor: 7 day average: 144  , 14 day average: 148 and 30 day average: 155  BPH: used to see Urologist - Dr. Lonna Cobb - years ago, had normal biopsies in the past. He was taking Cialis, however insurance no longer covers prescription.  He has nocturia once per night, no dribbling.   Hypogonadism: he has been on testosterone for about 5 years, using 3 instead of 4 pumps daily and it has helped his libido and ED since he went up from 2 pumps to 3 pumps daily.  His weight has been stable. No change in mood.  Hyperlipidemia: at goal, taking Atorvastatin daily, no cramping or chest pain    Patient Active Problem List   Diagnosis Date Noted  . Erectile dysfunction 06/26/2015  . BPH (benign prostatic hyperplasia) 03/20/2015  . 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 Dyslipidemia 03/07/2015  . Hypogonadism male 03/07/2015  . Obesity (BMI 30.0-34.9) 03/07/2015  . Allergic rhinitis, seasonal 03/07/2015  . Vitamin D deficiency 03/07/2015    Past  Surgical History:  Procedure Laterality Date  . VASECTOMY      History reviewed. No pertinent family history.  Social History   Social History  . Marital status: Married    Spouse name: N/A  . Number of children: N/A  . Years of education: N/A   Occupational History  . Not on file.   Social History Main Topics  . Smoking status: Never Smoker  . Smokeless tobacco: Never Used  . Alcohol use Yes     Comment: 1-2 beers on weekends  . Drug use: No  . Sexual activity: Yes    Birth control/ protection: None   Other Topics Concern  . Not on file   Social History Narrative  . No narrative on file     Current Outpatient Prescriptions:  .  aspirin 81 MG chewable tablet, Chew 1 tablet by mouth daily., Disp: , Rfl:  .  atorvastatin (LIPITOR) 20 MG tablet, TAKE 1 TABLET (20 MG TOTAL) BY MOUTH DAILY., Disp: 90 tablet, Rfl: 2 .  B-D ULTRAFINE III SHORT PEN 31G X 8 MM MISC, USE AS DIRECTED, Disp: 100 each, Rfl: 5 .  Cholecalciferol (VITAMIN D) 2000 UNITS CAPS, Take 1 capsule by mouth daily., Disp: , Rfl:  .  insulin lispro (HUMALOG KWIKPEN) 100 UNIT/ML KiwkPen, Inject 0.05-0.16 mLs (5-16 Units total) into the skin 4 (four) times daily., Disp: 45 mL, Rfl:  2 .  lisinopril (PRINIVIL,ZESTRIL) 5 MG tablet, TAKE 1 TABLET BY MOUTH ONCE DAILY, Disp: 90 tablet, Rfl: 1 .  ONE TOUCH ULTRA TEST test strip, USE 4 TIMES A DAY AS DIRECTED, Disp: 500 each, Rfl: 3 .  sildenafil (VIAGRA) 25 MG tablet, Take 1 tablet (25 mg total) by mouth daily as needed for erectile dysfunction., Disp: 10 tablet, Rfl: 2 .  Testosterone 12.5 MG/ACT (1%) GEL, APPLY 4 PUMPS ONCE DAILY AS DIRECTED, Disp: 450 g, Rfl: 1 .  TRESIBA FLEXTOUCH 200 UNIT/ML SOPN, INJECT 34 UNITS INTO THE SKIN DAILY, Disp: 9 mL, Rfl: 2  No Known Allergies   ROS  Constitutional: Negative for fever or weight change.  Respiratory: Negative for cough and shortness of breath.   Cardiovascular: Negative for chest pain or palpitations.   Gastrointestinal: Negative for abdominal pain, no bowel changes.  Musculoskeletal: Negative for gait problem or joint swelling.  Skin: Negative for rash.  Neurological: Negative for dizziness or headache.  No other specific complaints in a complete review of systems (except as listed in HPI above).  Objective  Vitals:   10/28/16 1329  BP: 102/63  Pulse: 72  Resp: 16  Temp: 98.1 F (36.7 C)  TempSrc: Oral  SpO2: 97%  Weight: 235 lb (106.6 kg)    Body mass index is 30.58 kg/m.  Physical Exam  Constitutional: Patient appears well-developed and well-nourished. Obese  No distress.  HEENT: head atraumatic, normocephalic, pupils equal and reactive to light, neck supple, throat within normal limits Cardiovascular: Normal rate, regular rhythm and normal heart sounds.  No murmur heard. No BLE edema. Pulmonary/Chest: Effort normal and breath sounds normal. No respiratory distress. Abdominal: Soft.  There is no tenderness. Psychiatric: Patient has a normal mood and affect. behavior is normal. Judgment and thought content normal.  Recent Results (from the past 2160 hour(s))  HM DIABETES EYE EXAM     Status: None   Collection Time: 08/12/16 12:00 AM  Result Value Ref Range   HM Diabetic Eye Exam No Retinopathy No Retinopathy    Comment: [facility]  POCT urinalysis dipstick     Status: Normal   Collection Time: 08/24/16 11:14 AM  Result Value Ref Range   Color, UA dark yellow    Clarity, UA clear    Glucose, UA neg    Bilirubin, UA neg    Ketones, UA neg    Spec Grav, UA 1.010    Blood, UA neg    pH, UA 6.0    Protein, UA neg    Urobilinogen, UA negative    Nitrite, UA neg    Leukocytes, UA Negative Negative  POCT HgB A1C     Status: Abnormal   Collection Time: 10/27/16  2:10 PM  Result Value Ref Range   Hemoglobin A1C 6.8       PHQ2/9: Depression screen [facility] 2/9 10/28/2016 08/24/2016 07/30/2016 07/12/2016 04/05/2016  Decreased Interest 0 0 0 0 0  Down,  Depressed, Hopeless 0 0 0 0 0  PHQ - 2 Score 0 0 0 0 0     Fall Risk: Fall Risk  10/28/2016 08/24/2016 07/30/2016 07/12/2016 04/05/2016  Falls in the past year? No No No No No  Number falls in past yr: - - - - -  Injury with Fall? - - - - -  Follow up - - - - -     Functional Status Survey: Is the patient deaf or have difficulty hearing?: No Does the patient have difficulty seeing,  even when wearing glasses/contacts?: No Does the patient have difficulty concentrating, remembering, or making decisions?: No Does the patient have difficulty walking or climbing stairs?: No Does the patient have difficulty dressing or bathing?: No Does the patient have difficulty doing errands alone such as visiting a doctor's office or shopping?: No    Assessment & Plan  1. 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)  Continue current regiment, doing well, denies hypoglycemic episodes that caused change in mental status, he is able to recognize symptoms and he always has a snack available.   2. Dyslipidemia  Taking Atorvastatin   3. Vitamin D deficiency  Still taking supplements  4. Hypogonadism male  Doing better with 3 pumps a day

## 2016-10-29 ENCOUNTER — Ambulatory Visit: Payer: Self-pay | Admitting: Pharmacist

## 2016-11-01 ENCOUNTER — Other Ambulatory Visit: Payer: Self-pay

## 2016-11-01 MED ORDER — FREESTYLE LANCETS MISC: 12 refills | Status: AC

## 2016-11-01 MED ORDER — GLUCOSE BLOOD VI STRP: ORAL_STRIP | 12 refills | Status: DC

## 2016-11-01 NOTE — Telephone Encounter (Signed)
Patient has now switch meters from One touch to Freestyle Freedom Lite Meter. Please send in new Rx for strips and lancets.

## 2016-11-05 ENCOUNTER — Ambulatory Visit: Payer: Self-pay | Admitting: Pharmacist

## 2016-11-05 ENCOUNTER — Other Ambulatory Visit: Payer: Self-pay | Admitting: Pharmacist

## 2016-11-05 NOTE — Patient Outreach (Signed)
[facility]) Care Management  11/05/2016  [name] 27-Apr-1966 [id number]   51 year old male last seen in [facility] on 07/30/16.  Called patient today to followup diabetes as part of employee sponsored program.  Was unable to reach patient via telephone and I have left him a HIPAA compliant message.    Plan: Will followup via telephone in 1 week.  Hazle NordmannKelsy Clariza Sickman, PharmD, BCPS [facility] PGY2 Pharmacy Resident [phone]

## 2016-11-12 ENCOUNTER — Ambulatory Visit: Payer: Self-pay | Admitting: Pharmacist

## 2016-11-12 ENCOUNTER — Other Ambulatory Visit: Payer: Self-pay | Admitting: Pharmacist

## 2016-11-12 NOTE — Patient Outreach (Signed)
[facility]) Care Management  11/12/2016  Ivan BailiffMarty N Bradham [birth year] [id number]   51 year old male last seen in [facility] on 07/30/16.  Called patient today to followup diabetes as part of employee sponsored program.  Was unable to reach patient via telephone and I have left him a HIPAA compliant message (unsuccessful outreach attempt #2).  Plan: Will followup via telephone in 1 week   Hazle NordmannKelsy Combs, PharmD, BCPS [facility] PGY2 Pharmacy Resident [phone]

## 2016-11-19 ENCOUNTER — Ambulatory Visit: Payer: Self-pay | Admitting: Pharmacist

## 2016-11-19 ENCOUNTER — Other Ambulatory Visit: Payer: Self-pay | Admitting: Pharmacist

## 2016-11-19 NOTE — Patient Outreach (Signed)
[facility]) Care Management  11/19/2016  Ivan Booker 12-Mar-1966 [id number]   51 year old male last seen in [facility] on 07/30/16. Called patient today to followup diabetes as part of employee sponsored program. Was unable to reach patient via telephone and I have left him a HIPAA compliant message (unsuccessful outreach attempt #3).  Plan: Will send [facility] Missed appointment letter and will close patient out if do not receive a response within 10 business days.  Hazle NordmannKelsy Laylah Riga, PharmD, BCPS [facility] PGY2 Pharmacy Resident [phone]

## 2016-12-06 ENCOUNTER — Other Ambulatory Visit: Payer: Self-pay | Admitting: Family Medicine

## 2016-12-06 DIAGNOSIS — E1049 Type 1 diabetes mellitus with other diabetic neurological complication: Secondary | ICD-10-CM

## 2016-12-06 DIAGNOSIS — N521 Erectile dysfunction due to diseases classified elsewhere: Principal | ICD-10-CM

## 2016-12-06 MED ORDER — INSULIN LISPRO 100 UNIT/ML (KWIKPEN): 5.0000 [IU] | PEN_INJECTOR | Freq: Four times a day (QID) | SUBCUTANEOUS | 2 refills | Status: DC

## 2016-12-06 NOTE — Telephone Encounter (Signed)
PT IS NEEDING HUMALOG PEN REFILLED. HE HAS ON A FEW DAYS LEFT.

## 2016-12-10 ENCOUNTER — Other Ambulatory Visit: Payer: Self-pay | Admitting: Pharmacist

## 2016-12-10 NOTE — Patient Outreach (Signed)
[facility]) Care Management  12/10/2016  [name] 10/31/1965 [id number]   Subjective: Called patient today for diabetes follow-up as part of the employer-sponsored [employer] to Wellness program.  Current diabetes regimen includes Tresiba 34 units daily + Humalog before meals (breakfast 6-8 units, Lunch 2-3 units, supper 13-14 units).  Patient also continues on daily aspirin, lisinopril and atorvastatin.  Most recent MD follow-up was 10/28/16.  Patient has a pending appt for 01/31/17.  No med changes or major health changes at this time.    Patient reported dietary habits: Eats 3 meals/day Supper is largest meal.  States he is adhering to low carbohydrate ~50% of the time. He states he had decreased fried foods and is eating more healthy foods.  His biggest weakness 1 time per week is hamburger and french fries.    Patient reported exercise habits: Golfing several days per week 6 of 7 days per week.  Staying very active most of the day for several hours (>4 hours).    Patient reports hypoglycemic events 2-3 over the past month.   Patient reports nocturia 1 time per night.  Patient denies pain/burning upon urination.  Patient denies neuropathy. Patient denies visual changes. Last eye exam 09/2016.   Last dental exam with Dr Linwood DibblesHargis Patient reports self foot exams. Denies changes..   Patient reported self monitored blood glucose frequency at least 4 times per day.  Fasting CBG today: 105 mg/dL Home fasting CBG: reports mostly 80-110 mg/dL  2 hour post-prandial/random CBG: 180-200 mg/dL  Objective:  Lab Results  Component Value Date   HGBA1C 6.8 10/27/2016   Lipid Panel     Component Value Date/Time   CHOL 154 04/06/2016 0858   TRIG 55 04/06/2016 0858   HDL 54 04/06/2016 0858   CHOLHDL 2.9 04/06/2016 0858   LDLCALC 89 04/06/2016 0858   Assessment:  Diabetes: Most recent A1C was 6.8% which is at goal of less than 7%. Patient continues on daily aspirin, statin, and  ACE inhibitor.   Plan/Goals for Next Visit: Counseled on signs/symptoms/treatment of hypoglycemia Discussed low carbohydrate diet and exercise goals.  Patient to continue exercise regimen with plan to increase as weather improves and he is able to golf more.   Next appointment to see me is: 3 months.     Hazle NordmannKelsy Ryiah Bellissimo, PharmD [facility] PGY2 Pharmacy Resident [phone]

## 2016-12-20 ENCOUNTER — Ambulatory Visit: Payer: 59 | Admitting: Family Medicine

## 2017-01-19 ENCOUNTER — Other Ambulatory Visit: Payer: Self-pay | Admitting: Family Medicine

## 2017-01-31 ENCOUNTER — Ambulatory Visit (INDEPENDENT_AMBULATORY_CARE_PROVIDER_SITE_OTHER): Payer: 59 | Admitting: Family Medicine

## 2017-01-31 ENCOUNTER — Encounter: Payer: Self-pay | Admitting: Family Medicine

## 2017-01-31 VITALS — BP 110/68 | HR 69 | Temp 98.3°F | Resp 16 | Ht 74.0 in | Wt 236.0 lb

## 2017-01-31 DIAGNOSIS — N521 Erectile dysfunction due to diseases classified elsewhere: Secondary | ICD-10-CM

## 2017-01-31 DIAGNOSIS — E785 Hyperlipidemia, unspecified: Secondary | ICD-10-CM

## 2017-01-31 DIAGNOSIS — R350 Frequency of micturition: Secondary | ICD-10-CM

## 2017-01-31 DIAGNOSIS — E559 Vitamin D deficiency, unspecified: Secondary | ICD-10-CM

## 2017-01-31 DIAGNOSIS — E1069 Type 1 diabetes mellitus with other specified complication: Secondary | ICD-10-CM | POA: Diagnosis not present

## 2017-01-31 DIAGNOSIS — E291 Testicular hypofunction: Secondary | ICD-10-CM | POA: Diagnosis not present

## 2017-01-31 DIAGNOSIS — N401 Enlarged prostate with lower urinary tract symptoms: Secondary | ICD-10-CM

## 2017-01-31 DIAGNOSIS — E1049 Type 1 diabetes mellitus with other diabetic neurological complication: Secondary | ICD-10-CM

## 2017-01-31 NOTE — Progress Notes (Signed)
Name: Ivan Booker   MRN: [id number]    DOB: January 02, 1966   Date:01/31/2017       Progress Note  Subjective  Chief Complaint  Chief Complaint  Patient presents with  . Diabetes    3 month follow up  . Hyperlipidemia  . Hypogonadism  . Benign Prostatic Hypertrophy    HPI  DMI with ED: he is doing well . No recent episodes of hypoglycemia. He can tell when sugar is dropping, he has mild diaphoresis before an episode, lowest level recently was down to 50's, and it happened after he played golf. Usually happens when he exercises and does not adjust the insulin prior to a meal. Glucose fasting: No polyphagia, polyuria or polydipsia. ED is okay with Viagra ( instead of Cialis ), changed because of formulary change.  Taking ace for kidney protection. Ivan Booker seems to be helping the fasting levels. He is compliant with his medication, logging his glucose daily and exercise. He was diagnosed with DM as an young adult ( mid-20's ) and has good knowledge of his condition.  He is [facility] and is compliant with diet and medical recommendations. No hypoglycemia while driving or at work, usually at night, after dinner  BPH: used to see Urologist - Ivan Booker - years ago, had normal biopsies in the past. He was taking Cialis, however insurance no longer covers prescription, so he is now on Viagra  He has nocturia once per night, no dribbling.   Hypogonadism: he has been on testosterone for about 5 years, using 3 to 4 pumps daily and it has helped his libido and ED His weight has been stable. No change in mood. We will recheck labs and check PSA  Hyperlipidemia: at goal, taking Atorvastatin daily, no cramping or chest pain    Patient Active Problem List   Diagnosis Date Noted  . Erectile dysfunction 06/26/2015  . BPH (benign prostatic hyperplasia) 03/20/2015  . 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 Dyslipidemia 03/07/2015   . Hypogonadism male 03/07/2015  . Obesity (BMI 30.0-34.9) 03/07/2015  . Allergic rhinitis, seasonal 03/07/2015  . Vitamin D deficiency 03/07/2015    Past Surgical History:  Procedure Laterality Date  . VASECTOMY      History reviewed. No pertinent family history.  Social History   Social History  . Marital status: Married    Spouse name: N/A  . Number of children: N/A  . Years of education: N/A   Occupational History  . Not on file.   Social History Main Topics  . Smoking status: Never Smoker  . Smokeless tobacco: Never Used  . Alcohol use Yes     Comment: 1-2 beers on weekends  . Drug use: No  . Sexual activity: Yes    Birth control/ protection: None   Other Topics Concern  . Not on file   Social History Narrative  . No narrative on file     Current Outpatient Prescriptions:  .  aspirin 81 MG chewable tablet, Chew 1 tablet by mouth daily., Disp: , Rfl:  .  atorvastatin (LIPITOR) 20 MG tablet, TAKE 1 TABLET (20 MG TOTAL) BY MOUTH DAILY., Disp: 90 tablet, Rfl: 2 .  B-D ULTRAFINE III SHORT PEN 31G X 8 MM MISC, USE AS DIRECTED, Disp: 100 each, Rfl: 5 .  Cholecalciferol (VITAMIN D) 2000 UNITS CAPS, Take 1 capsule by mouth daily., Disp: , Rfl:  .  glucose blood (FREESTYLE LITE) test strip,  Use as instructed, Disp: 100 each, Rfl: 12 .  insulin lispro (HUMALOG KWIKPEN) 100 UNIT/ML KiwkPen, Inject 0.05-0.16 mLs (5-16 Units total) into the skin 4 (four) times daily., Disp: 45 mL, Rfl: 2 .  Lancets (FREESTYLE) lancets, Use as instructed, Disp: 100 each, Rfl: 12 .  lisinopril (PRINIVIL,ZESTRIL) 5 MG tablet, TAKE 1 TABLET BY MOUTH ONCE DAILY, Disp: 90 tablet, Rfl: 1 .  sildenafil (VIAGRA) 25 MG tablet, Take 1 tablet (25 mg total) by mouth daily as needed for erectile dysfunction., Disp: 10 tablet, Rfl: 2 .  Testosterone 12.5 MG/ACT (1%) GEL, APPLY 4 PUMPS ONCE DAILY AS DIRECTED, Disp: 450 g, Rfl: 1 .  TRESIBA FLEXTOUCH 200 UNIT/ML SOPN, INJECT 34 UNITS INTO THE SKIN DAILY,  Disp: 9 mL, Rfl: 2  No Known Allergies   ROS  Constitutional: Negative for fever or weight change.  Respiratory: Negative for cough and shortness of breath.   Cardiovascular: Negative for chest pain or palpitations.  Gastrointestinal: Negative for abdominal pain, no bowel changes.  Musculoskeletal: Negative for gait problem or joint swelling.  Skin: Negative for rash.  Neurological: Negative for dizziness or headache.  No other specific complaints in a complete review of systems (except as listed in HPI above).  Objective  Vitals:   01/31/17 1349  BP: 110/68  Pulse: 69  Resp: 16  Temp: 98.3 F (36.8 C)  SpO2: 97%  Weight: 236 lb (107 kg)  Height: 6\' 2"  (1.88 m)    Body mass index is 30.3 kg/m.  Physical Exam  Constitutional: Patient appears well-developed and well-nourished. Obese  No distress.  HEENT: head atraumatic, normocephalic, pupils equal and reactive to light,  neck supple, throat within normal limits Cardiovascular: Normal rate, regular rhythm and normal heart sounds.  No murmur heard. No BLE edema. Pulmonary/Chest: Effort normal and breath sounds normal. No respiratory distress. Abdominal: Soft.  There is no tenderness. Psychiatric: Patient has a normal mood and affect. behavior is normal. Judgment and thought content normal.  PHQ2/9: Depression screen [facility] 2/9 01/31/2017 10/28/2016 08/24/2016 07/30/2016 07/12/2016  Decreased Interest 0 0 0 0 0  Down, Depressed, Hopeless 0 0 0 0 0  PHQ - 2 Score 0 0 0 0 0     Fall Risk: Fall Risk  01/31/2017 10/28/2016 08/24/2016 07/30/2016 07/12/2016  Falls in the past year? No No No No No  Number falls in past yr: - - - - -  Injury with Fall? - - - - -  Follow up - - - - -      Assessment & Plan  1. 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)  - COMPLETE METABOLIC PANEL WITH GFR - Hemoglobin A1c - Urine Microalbumin w/creat. ratio  2. Dyslipidemia  - Lipid panel  3. Vitamin D  deficiency  Continue supplementation   4. Hypogonadism male  He has been on testosterone supplementation, he has been on Androgel 4 pumps for several year. - COMPLETE METABOLIC PANEL WITH GFR - CBC with Differential/Platelet - Testosterone - Testosterone, free - Testosterone, % free - Sex hormone binding globulin  5. Benign prostatic hyperplasia with urinary frequency  - PSA  6. Diabetic erectile dysfunction associated with type 1 diabetes mellitus (HCC)  On viagra

## 2017-02-01 LAB — CBC WITH DIFFERENTIAL/PLATELET
Basophils Absolute: 60 cells/uL (ref 0–200)
Basophils Relative: 1 %
EOS PCT: 4 %
Eosinophils Absolute: 240 cells/uL (ref 15–500)
HCT: 45.6 % (ref 38.5–50.0)
HEMOGLOBIN: 15.8 g/dL (ref 13.2–17.1)
LYMPHS ABS: 1800 {cells}/uL (ref 850–3900)
Lymphocytes Relative: 30 %
MCH: 30.3 pg (ref 27.0–33.0)
MCHC: 34.6 g/dL (ref 32.0–36.0)
MCV: 87.4 fL (ref 80.0–100.0)
MONOS PCT: 10 %
MPV: 9.4 fL (ref 7.5–12.5)
Monocytes Absolute: 600 cells/uL (ref 200–950)
NEUTROS ABS: 3300 {cells}/uL (ref 1500–7800)
Neutrophils Relative %: 55 %
Platelets: 186 10*3/uL (ref 140–400)
RBC: 5.22 MIL/uL (ref 4.20–5.80)
RDW: 13.2 % (ref 11.0–15.0)
WBC: 6 10*3/uL (ref 3.8–10.8)

## 2017-02-01 LAB — MICROALBUMIN / CREATININE URINE RATIO
CREATININE, URINE: 156 mg/dL (ref 20–370)
MICROALB UR: 0.4 mg/dL
MICROALB/CREAT RATIO: 3 ug/mg{creat} (ref <30)

## 2017-02-01 LAB — COMPLETE METABOLIC PANEL WITH GFR
ALBUMIN: 3.9 g/dL (ref 3.6–5.1)
ALK PHOS: 72 U/L (ref 40–115)
ALT: 15 U/L (ref 9–46)
AST: 14 U/L (ref 10–35)
BUN: 15 mg/dL (ref 7–25)
CALCIUM: 8.8 mg/dL (ref 8.6–10.3)
CO2: 28 mmol/L (ref 20–31)
Chloride: 104 mmol/L (ref 98–110)
Creat: 0.86 mg/dL (ref 0.70–1.33)
Glucose, Bld: 114 mg/dL — ABNORMAL HIGH (ref 65–99)
POTASSIUM: 4.4 mmol/L (ref 3.5–5.3)
SODIUM: 139 mmol/L (ref 135–146)
Total Bilirubin: 1.2 mg/dL (ref 0.2–1.2)
Total Protein: 6 g/dL — ABNORMAL LOW (ref 6.1–8.1)

## 2017-02-01 LAB — LIPID PANEL
CHOL/HDL RATIO: 2.7 ratio (ref <5.0)
Cholesterol: 135 mg/dL (ref <200)
HDL: 50 mg/dL (ref >40)
LDL CALC: 74 mg/dL (ref <100)
Triglycerides: 54 mg/dL (ref <150)
VLDL: 11 mg/dL (ref <30)

## 2017-02-01 LAB — PSA: PSA: 0.4 ng/mL (ref <=4.0)

## 2017-02-02 LAB — HEMOGLOBIN A1C
Hgb A1c MFr Bld: 7.2 % — ABNORMAL HIGH (ref <5.7)
MEAN PLASMA GLUCOSE: 160 mg/dL

## 2017-02-02 LAB — TESTOSTERONE, FREE AND TOTAL (INCLUDES SHBG)-(MALES)
SEX HORMONE BINDING: 31 nmol/L (ref 10–50)
Testosterone, Free: 90.8 pg/mL (ref 47.0–244.0)
Testosterone-% Free: 2.1 % (ref 1.6–2.9)
Testosterone: 427 ng/dL (ref 250–827)

## 2017-02-15 NOTE — Telephone Encounter (Signed)
Erroneous entry

## 2017-02-25 ENCOUNTER — Encounter: Payer: Self-pay | Admitting: Family Medicine

## 2017-02-25 ENCOUNTER — Other Ambulatory Visit: Payer: Self-pay | Admitting: Family Medicine

## 2017-02-25 DIAGNOSIS — N521 Erectile dysfunction due to diseases classified elsewhere: Principal | ICD-10-CM

## 2017-02-25 DIAGNOSIS — E1049 Type 1 diabetes mellitus with other diabetic neurological complication: Secondary | ICD-10-CM

## 2017-03-18 ENCOUNTER — Other Ambulatory Visit: Payer: Self-pay | Admitting: Pharmacist

## 2017-03-18 ENCOUNTER — Encounter: Payer: Self-pay | Admitting: Pharmacist

## 2017-03-18 NOTE — Patient Outreach (Signed)
[facility]) Care Management  [facility]   03/18/2017   Ivan BailiffMarty N Booker 15-Feb-1966 [id number]  Subjective: Patient presents today for 3 month diabetes follow-up as part of the employer-sponsored Link to Wellness program.  Current diabetes regimen includes Tresiba 34 units daily and Humalog sliding scale.  Patient also continues on daily aspirin, lisinopril and atorvastatin.  Most recent MD follow-up was 01/31/17 with Ivan Booker.     Patient reports his A1C increased some but he feels like he is doing better now.  He also reports he has had some more lows with the [place] the golf [place]  Patient reported dietary habits: Eats 3 meals/day Breakfast:egg mcmuffin or sausage egg biscuit (takes top half of bread off) Lunch:salad with chicken or ham.  Dinner:grilled chicken or grilled steak or grilled salmon  Snacks:peanuts  Drinks: water, gatorade, unsweet tea, decreased diet soda to 1-2 per day Has cut back on portion sizes such as pizza slices.   Patient reported exercise habits: golfing every day (2 hours) riding and walking   Patient reports hypoglycemic events once per week.  Happens more often when golfing.  Patient has skipping insulin with lunch before golfing.   Patient reports nocturia 1 time per night.  Patient denies pain/burning upon urination.  Patient denies neuropathy. Patient denies visual changes.  Last eye exam 09/2016.  Last dental exam 02/2017 with Ivan Booker Patient denies self foot exams. Denies changes  Patient reported self monitored blood glucose frequency 4-5 times per day Home fasting CBG: reports most fastings <130 mg/dL.  Reports most of his highs are 2 hours after supper.   7 day avg 151  14 day avg 154  30 day avg 148   Objective:  Lab Results  Component Value Date   HGBA1C 7.2 (H) 01/31/2017   Vitals:   03/18/17 1342  BP: 92/62   Lipid Panel     Component Value Date/Time   CHOL 135 01/31/2017 0935   CHOL 154 04/06/2016 0858    TRIG 54 01/31/2017 0935   HDL 50 01/31/2017 0935   HDL 54 04/06/2016 0858   CHOLHDL 2.7 01/31/2017 0935   VLDL 11 01/31/2017 0935   LDLCALC 74 01/31/2017 0935   LDLCALC 89 04/06/2016 0858   Encounter Medications: Outpatient Encounter Prescriptions as of 03/18/2017  Medication Sig  . aspirin 81 MG chewable tablet Chew 1 tablet by mouth daily.  Marland Kitchen. atorvastatin (LIPITOR) 20 MG tablet TAKE 1 TABLET (20 MG TOTAL) BY MOUTH DAILY.  Marland Kitchen. B-D ULTRAFINE III SHORT PEN 31G X 8 MM MISC USE AS DIRECTED  . Cholecalciferol (VITAMIN D) 2000 UNITS CAPS Take 1 capsule by mouth daily.  Marland Kitchen. glucose blood (FREESTYLE LITE) test strip Use as instructed  . insulin lispro (HUMALOG KWIKPEN) 100 UNIT/ML KiwkPen Inject 0.05-0.16 mLs (5-16 Units total) into the skin 4 (four) times daily.  . Lancets (FREESTYLE) lancets Use as instructed  . lisinopril (PRINIVIL,ZESTRIL) 5 MG tablet TAKE 1 TABLET BY MOUTH ONCE DAILY  . sildenafil (VIAGRA) 25 MG tablet Take 1 tablet (25 mg total) by mouth daily as needed for erectile dysfunction.  . Testosterone 12.5 MG/ACT (1%) GEL APPLY 4 PUMPS ONCE DAILY AS DIRECTED  . TRESIBA FLEXTOUCH 200 UNIT/ML SOPN INJECT 34 UNITS INTO THE SKIN DAILY   No facility-administered encounter medications on file as of 03/18/2017.     Functional Status: In your present state of health, do you have any difficulty performing the following activities: 03/18/2017 10/28/2016  Hearing? N N  Vision? N N  Difficulty concentrating or making decisions? N N  Walking or climbing stairs? N N  Dressing or bathing? N N  Doing errands, shopping? N N  Preparing Food and eating ? N -  Using the Toilet? N -  In the past six months, have you accidently leaked urine? N -  Do you have problems with loss of bowel control? N -  Managing your Medications? N -  Managing your Finances? N -  Housekeeping or managing your Housekeeping? N -  Some recent data might be hidden    Fall/Depression Screening: Fall Risk  03/18/2017  01/31/2017 10/28/2016  Falls in the past year? No No No  Number falls in past yr: - - -  Injury with Fall? - - -  Follow up - - -   PHQ 2/9 Scores 03/18/2017 01/31/2017 10/28/2016 08/24/2016 07/30/2016 07/12/2016 04/05/2016  PHQ - 2 Score 0 0 0 0 0 0 0     Assessment:  Diabetes: Most recent A1C was 7.2% which is slightly above goal of less than 7%.   Plan/Goals for Next Visit: Discussed low carbohydrate diet and exercise Discussed signs/symptoms/treatment of hypoglycemia Patient set the following goals: -minimizing lows and continuing to check CBGs -Continue golfing several days per week  -Eating protein bar during golf to prevent lows.    Next appointment to see me is: 3 months Ivan Booker, PharmD)   Ivan Booker, PharmD [facility] PGY2 Pharmacy Resident [phone]  [facility] CM Care Plan Problem One     Most Recent Value  Care Plan Problem One  Diabetes  Role Documenting the Problem One  Clinical Pharmacist  Care Plan for Problem One  Active  [facility] Long Term Goal   Patient will continue to follow diabetic diet >75% of the time over the next 90 days per patient report.   [facility] Long Term Goal Start Date  03/18/17  Interventions for Problem One Long Term Goal  Discussed importance of diet in diabetes management.  Patient will continue to follow diabetic diet and count carbohydrates.     [facility] CM Care Plan Problem Two     Most Recent Value  Care Plan Problem Two  Exercise  Role Documenting the Problem Two  Clinical Pharmacist  Care Plan for Problem Two  Active  Interventions for Problem Two Long Term Goal   Discussed importance of exercise in diabetes management.  Reviewed ADA goal to exercise at least 150 minutes per week. and walking on the golf course.   [facility] Long Term Goal  Patient will maintain activity level of > 150 minutes per week over the next 90 days per patient report.  [facility] Long Term Goal Start Date  03/18/17

## 2017-04-08 ENCOUNTER — Other Ambulatory Visit: Payer: Self-pay | Admitting: Family Medicine

## 2017-04-08 NOTE — Telephone Encounter (Signed)
[facility].

## 2017-04-21 ENCOUNTER — Other Ambulatory Visit: Payer: Self-pay | Admitting: Family Medicine

## 2017-04-21 NOTE — Telephone Encounter (Signed)
Patient [facility] and [facility].

## 2017-05-05 ENCOUNTER — Ambulatory Visit (INDEPENDENT_AMBULATORY_CARE_PROVIDER_SITE_OTHER): Payer: 59 | Admitting: Family Medicine

## 2017-05-05 ENCOUNTER — Encounter: Payer: Self-pay | Admitting: Family Medicine

## 2017-05-05 VITALS — BP 110/60 | HR 65 | Temp 98.7°F | Resp 18 | Ht 74.0 in | Wt 232.8 lb

## 2017-05-05 DIAGNOSIS — N521 Erectile dysfunction due to diseases classified elsewhere: Secondary | ICD-10-CM

## 2017-05-05 DIAGNOSIS — E291 Testicular hypofunction: Secondary | ICD-10-CM

## 2017-05-05 DIAGNOSIS — E1049 Type 1 diabetes mellitus with other diabetic neurological complication: Secondary | ICD-10-CM

## 2017-05-05 DIAGNOSIS — E785 Hyperlipidemia, unspecified: Secondary | ICD-10-CM

## 2017-05-05 DIAGNOSIS — E559 Vitamin D deficiency, unspecified: Secondary | ICD-10-CM | POA: Diagnosis not present

## 2017-05-05 MED ORDER — INSULIN DEGLUDEC 200 UNIT/ML ~~LOC~~ SOPN: 36.0000 [IU] | PEN_INJECTOR | Freq: Every day | SUBCUTANEOUS | 2 refills | Status: DC

## 2017-05-05 NOTE — Progress Notes (Signed)
Name: Ivan Booker   MRN: [id number]    DOB: [birth year]   Date:05/05/2017       Progress Note  Subjective  Chief Complaint  Chief Complaint  Patient presents with  . Follow-up    paper work and A1c for DMV    HPI  DMI with ED: he is doing well . No recent episodes of hypoglycemia. He can tell when sugar is dropping, he has mild diaphoresis before an episode, lowest level recently was down to 63 fasting, it happened after a more active day without eating more that night. Usually happens when he exercises and does not adjust the insulin prior to a meal. Glucose fasting: around 120's but can go up to 130's, post-prandially 150's, but it can go up to 180's. He still checks fsbs multiple times daily. No polyphagia, polyuria or polydipsia. ED is okay with Viagra ( instead of Cialis ), changed because of formulary change. His hgbA1C has been going up for the past 6 months, same time that he started to go down on Testosterone supplementation - because of coverage problems with his insurance.  Taking ace for kidney protection. [name] seems to be helping the fasting levels. He is compliant with his medication, logging his glucose daily and exercise. He was diagnosed with DM as an young adult ( mid-20's ) and has good knowledge of his condition.  He is still talking [facility] and is compliant with diet and medical recommendations. No hypoglycemia while driving or at work. He [facility] Control and instrumentation [facility]health [facility] 3 months.   BPH: used to see Urologist - Dr. Lonna CobbStoioff - years ago, had normal biopsies in the past. He was taking Cialis, however insurance no longer covers prescription, so he is now on Viagra He has nocturia once per night, no dribbling, denies weak stream.    Hypogonadism: he has been on testosterone for about 5 years, use to need  3to 4 pumps daily and it has helped his libido and EDbut had problems with insurance and is only using 2 pumps every other day for the past month. He has not  noticed much change in libido, or ED.   Hyperlipidemia: at goal, taking Atorvastatin daily, no cramping or chest pain    Patient Active Problem List   Diagnosis Date Noted  . Erectile dysfunction 06/26/2015  . BPH (benign prostatic hyperplasia) 03/20/2015  . 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 Dyslipidemia 03/07/2015  . Hypogonadism male 03/07/2015  . Obesity (BMI 30.0-34.9) 03/07/2015  . Allergic rhinitis, seasonal 03/07/2015  . Vitamin D deficiency 03/07/2015    Past Surgical History:  Procedure Laterality Date  . VASECTOMY      No family history on file.  Social History   Social History  . Marital status: Married    Spouse name: N/A  . Number of children: N/A  . Years of education: N/A   Occupational History  . Not on file.   Social History Main Topics  . Smoking status: Never Smoker  . Smokeless tobacco: Never Used  . Alcohol use Yes     Comment: 1-2 beers on weekends  . Drug use: No  . Sexual activity: Yes    Birth control/ protection: None   Other Topics Concern  . Not on file   Social History Narrative  . No narrative on file     Current Outpatient Prescriptions:  .  aspirin 81 MG chewable tablet, Chew 1 tablet by mouth  daily., Disp: , Rfl:  .  atorvastatin (LIPITOR) 20 MG tablet, TAKE 1 TABLET (20 MG TOTAL) BY MOUTH DAILY., Disp: 90 tablet, Rfl: 2 .  B-D ULTRAFINE III SHORT PEN 31G X 8 MM MISC, USE AS DIRECTED, Disp: 100 each, Rfl: 5 .  Cholecalciferol (VITAMIN D) 2000 UNITS CAPS, Take 1 capsule by mouth daily., Disp: , Rfl:  .  glucose blood (FREESTYLE LITE) test strip, Use as instructed, Disp: 100 each, Rfl: 12 .  Insulin Degludec (TRESIBA FLEXTOUCH) 200 UNIT/ML SOPN, Inject 36-40 Units into the skin daily. For fasting between 95-110, Disp: 9 mL, Rfl: 2 .  insulin lispro (HUMALOG KWIKPEN) 100 UNIT/ML KiwkPen, Inject 0.05-0.16 mLs (5-16 Units total) into the skin 4 (four) times daily., Disp: 45  mL, Rfl: 2 .  Lancets (FREESTYLE) lancets, Use as instructed, Disp: 100 each, Rfl: 12 .  lisinopril (PRINIVIL,ZESTRIL) 5 MG tablet, TAKE 1 TABLET BY MOUTH ONCE DAILY, Disp: 90 tablet, Rfl: 1 .  sildenafil (VIAGRA) 25 MG tablet, TAKE 1 TABLET BY MOUTH DAILY AS NEEDED FOR ERECTILE DYSFUNCTION., Disp: 10 tablet, Rfl: 2  No Known Allergies   ROS  Constitutional: Negative for fever or weight change.  Respiratory: Negative for cough and shortness of breath.   Cardiovascular: Negative for chest pain or palpitations.  Gastrointestinal: Negative for abdominal pain, no bowel changes.  Musculoskeletal: Negative for gait problem or joint swelling.  Skin: Negative for rash.  Neurological: Negative for dizziness or headache.  No other specific complaints in a complete review of systems (except as listed in HPI above).  Objective  Vitals:   05/05/17 1405  BP: 110/60  Pulse: 65  Resp: 18  Temp: 98.7 F (37.1 C)  TempSrc: Oral  SpO2: 97%  Weight: 232 lb 12.8 oz (105.6 kg)  Height: 6\' 2"  (1.88 m)    Body mass index is 29.89 kg/m.  Physical Exam  Constitutional: Patient appears well-developed and well-nourished. Overweight. No distress.  HEENT: head atraumatic, normocephalic, pupils equal and reactive to light,  neck supple, throat within normal limits Cardiovascular: Normal rate, regular rhythm and normal heart sounds.  No murmur heard. No BLE edema. Pulmonary/Chest: Effort normal and breath sounds normal. No respiratory distress. Abdominal: Soft.  There is no tenderness. Psychiatric: Patient has a normal mood and affect. behavior is normal. Judgment and thought content normal.  PHQ2/9: Depression screen [facility] 2/9 03/18/2017 01/31/2017 10/28/2016 08/24/2016 07/30/2016  Decreased Interest 0 0 0 0 0  Down, Depressed, Hopeless 0 0 0 0 0  PHQ - 2 Score 0 0 0 0 0     Fall Risk: Fall Risk  03/18/2017 01/31/2017 10/28/2016 08/24/2016 07/30/2016  Falls in the past year? No No No No No  Number falls  in past yr: - - - - -  Comment - - - - -  Injury with Fall? - - - - -  Comment - - - - -  Follow up - - - - -    Assessment & Plan  1. 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 For fasting between 95-110  Dispense: 9 mL; Refill: 2  2. Dyslipidemia  Continue Atorvastatin   3. Hypogonadism male  Almost out of testosterone we will recheck when off to see if insurance will approve it again, but only if he has symptoms of low T  4. Vitamin D deficiency  Taking supplementation

## 2017-05-06 LAB — POCT GLYCOSYLATED HEMOGLOBIN (HGB A1C): HEMOGLOBIN A1C: 7.5

## 2017-05-06 NOTE — Addendum Note (Signed)
Addended by: Idelle CrouchICHMOND, LATISHA A on: 05/06/2017 09:38 AM   Modules accepted: Orders

## 2017-05-09 ENCOUNTER — Ambulatory Visit: Payer: 59 | Admitting: Family Medicine

## 2017-06-24 ENCOUNTER — Encounter: Payer: Self-pay | Admitting: Pharmacist

## 2017-06-24 ENCOUNTER — Other Ambulatory Visit: Payer: Self-pay | Admitting: Pharmacist

## 2017-06-24 NOTE — Patient Outreach (Signed)
[facility]) Care Management  [place]   06/24/2017  DONTARIOUS SCHAUM 1965/10/21 [id number]  Subjective: Patient presents today for 3 month diabetes follow-up as part of the employer-sponsored Link to Wellness program.  Current diabetes regimen includes Tresiba 34 units daily, humalog 5-18 mL achs.  Patient also continues on daily aspirin, lisinopril and atorvastatin.  Patient endorses compliance to medications. Most recent MD follow-up was 05/05/2017 with Dr. Ancil Boozer. Med changes include not increasing Tyler Aas as instructed and instead increasing humalog around dinner. No major health changes.   Recently got back from cruise, was discouraged by A1C, determined to get it back down. Not interested in pump at this time, potentially interested in CGM.   Sweaty and mood changes with hypoglycemia. Gets low ~1x week.   Patient reported dietary habits: Eats 3 meals/day Breakfast: 2 egg McMuffins Lunch:  chef salad, CF cookie, few crackers Dinner: grilled chicken salad, rolls, baked potatoes  Snacks: apple crisp dessert, peanuts, sunflower seeds, caramel centers Drinks: water, diet soda  Patient reported exercise habits:  Golf 5 days/week, plays 9 holes usually  Patient reports hypoglycemic events. 0-1x/week on average Patient reports nocturia x1.  Patient denies pain/burning upon urination.  Patient denies neuropathy. Patient denies visual changes. Patient reports self foot exams. Daily, no issues.   Patient reported self monitored blood glucose frequency 3-4 times daily Home fasting CBG: 90-110s now that he has self-increased humalog with supper 2 hour post-prandial/random CBG: 160-200  Objective:  Lab Results  Component Value Date   HGBA1C 7.5 05/06/2017   Vitals:   06/24/17 1405  BP: 102/62  Pulse: 60    Lipid Panel     Component Value Date/Time   CHOL 135 01/31/2017 0935   CHOL 154 04/06/2016 0858   TRIG 54 01/31/2017 0935   HDL 50 01/31/2017 0935    HDL 54 04/06/2016 0858   CHOLHDL 2.7 01/31/2017 0935   VLDL 11 01/31/2017 0935   LDLCALC 74 01/31/2017 0935   LDLCALC 89 04/06/2016 0858    Last eye exam: 09/27/2016, [facility] Last dental exam: 04/27/2017, [facility] Vaccines: has not gotten flu shot yet.   Encounter Medications: Outpatient Encounter Prescriptions as of 06/24/2017  Medication Sig Note  . aspirin 81 MG chewable tablet Chew 1 tablet by mouth daily.   Marland Kitchen atorvastatin (LIPITOR) 20 MG tablet TAKE 1 TABLET (20 MG TOTAL) BY MOUTH DAILY.   Marland Kitchen B-D ULTRAFINE III SHORT PEN 31G X 8 MM MISC USE AS DIRECTED   . Cholecalciferol (VITAMIN D) 2000 UNITS CAPS Take 1 capsule by mouth daily.   Marland Kitchen glucose blood (FREESTYLE LITE) test strip Use as instructed   . Insulin Degludec (TRESIBA FLEXTOUCH) 200 UNIT/ML SOPN Inject 36-40 Units into the skin daily. For fasting between 95-110 (Patient taking differently: Inject 34 Units into the skin daily. )   . insulin lispro (HUMALOG KWIKPEN) 100 UNIT/ML KiwkPen Inject 0.05-0.16 mLs (5-16 Units total) into the skin 4 (four) times daily. 06/24/2017: Will increase up to 18 units with supper  . Lancets (FREESTYLE) lancets Use as instructed   . lisinopril (PRINIVIL,ZESTRIL) 5 MG tablet TAKE 1 TABLET BY MOUTH ONCE DAILY   . sildenafil (VIAGRA) 25 MG tablet TAKE 1 TABLET BY MOUTH DAILY AS NEEDED FOR ERECTILE DYSFUNCTION.    No facility-administered encounter medications on file as of 06/24/2017.     Functional Status: In your present state of health, do you have any difficulty performing the following activities: 06/24/2017 03/18/2017  Hearing? N N  Vision? N N  Difficulty concentrating or making decisions? N N  Walking or climbing stairs? N N  Dressing or bathing? N N  Doing errands, shopping? N N  Preparing Food and eating ? - N  Using the Toilet? - N  In the past six months, have you accidently leaked urine? - N  Do you have problems with loss of bowel control? - N   Managing your Medications? - N  Managing your Finances? - N  Housekeeping or managing your Housekeeping? - N  Some recent data might be hidden    Fall/Depression Screening: Fall Risk  06/24/2017 03/18/2017 01/31/2017  Falls in the past year? No No No  Number falls in past yr: - - -  Comment - - -  Injury with Fall? - - -  Comment - - -  Follow up - - -   PHQ 2/9 Scores 06/24/2017 06/24/2017 03/18/2017 01/31/2017 10/28/2016 08/24/2016 07/30/2016  PHQ - 2 Score 0 0 0 0 0 0 0     Assessment: Diabetes: uncontrolled, limited by dietary indiscretion and unoptimized insulin regimen.  - A1C 7.5% which is above goal of less than 7%.  - Weight is stable from last visit with me.    Physical Activity- adequate - Above goal of at least 150 minutes of moderate intensity exercise weekly  Nutrition- adequate - Maintaining adherence to diabetic diet 75% of the time  Plan/Goals for Next Visit: - Counseled on s/sx hypoglycemia and appropriate treatment - Diet goal: stick to complex carbs when able - Exercise goal: Continue the good work - Provided information on CGM options, patient interested - Follow-up: CGM start - Detailed goals and action plans are listed below  Next appointment to see me is: Jan, 2019  Deirdre Pippins, PharmD [facility] PGY2 Pharmacy Resident [phone]  [facility] CM Care Plan Problem One     Most Recent Value  Care Plan Problem One  Diabetes  Role Documenting the Problem One  Clinical Pharmacist  Care Plan for Problem One  Active  [facility] Long Term Goal   Patient will continue to follow diabetic diet >75% of the time over the next 90 days per patient report.   [facility] Long Term Goal Start Date  03/18/17  Interventions for Problem One Long Term Goal  Counseled on ways to get back into routine, encouraged him not to be discouraged by A1C, offered CGM options    [facility] CM Care Plan Problem Two     Most Recent Value  Care Plan Problem Two  Exercise  Role Documenting the Problem Two   Clinical Pharmacist  Care Plan for Problem Two  Active  Interventions for Problem Two Long Term Goal   Congratulated, continue to keep up the good work  [facility] Long Term Goal  Patient will maintain activity level of > 150 minutes per week over the next 90 days per patient report.  [facility] Long Term Goal Start Date  03/18/17  [facility] Long Term Goal Met Date  06/24/17

## 2017-07-27 ENCOUNTER — Ambulatory Visit (INDEPENDENT_AMBULATORY_CARE_PROVIDER_SITE_OTHER): Payer: 59 | Admitting: Family Medicine

## 2017-07-27 ENCOUNTER — Encounter: Payer: Self-pay | Admitting: Family Medicine

## 2017-07-27 VITALS — BP 116/74 | HR 60 | Temp 97.9°F | Resp 14 | Ht 73.75 in | Wt 232.5 lb

## 2017-07-27 DIAGNOSIS — N401 Enlarged prostate with lower urinary tract symptoms: Secondary | ICD-10-CM

## 2017-07-27 DIAGNOSIS — N521 Erectile dysfunction due to diseases classified elsewhere: Secondary | ICD-10-CM | POA: Diagnosis not present

## 2017-07-27 DIAGNOSIS — R350 Frequency of micturition: Secondary | ICD-10-CM

## 2017-07-27 DIAGNOSIS — Z23 Encounter for immunization: Secondary | ICD-10-CM | POA: Diagnosis not present

## 2017-07-27 DIAGNOSIS — E559 Vitamin D deficiency, unspecified: Secondary | ICD-10-CM

## 2017-07-27 DIAGNOSIS — E1049 Type 1 diabetes mellitus with other diabetic neurological complication: Secondary | ICD-10-CM | POA: Diagnosis not present

## 2017-07-27 DIAGNOSIS — E291 Testicular hypofunction: Secondary | ICD-10-CM

## 2017-07-27 DIAGNOSIS — E785 Hyperlipidemia, unspecified: Secondary | ICD-10-CM

## 2017-07-27 DIAGNOSIS — Z0000 Encounter for general adult medical examination without abnormal findings: Secondary | ICD-10-CM

## 2017-07-27 DIAGNOSIS — E1069 Type 1 diabetes mellitus with other specified complication: Secondary | ICD-10-CM

## 2017-07-27 LAB — POCT GLYCOSYLATED HEMOGLOBIN (HGB A1C): Hemoglobin A1C: 7

## 2017-07-27 NOTE — Progress Notes (Signed)
Name: Ivan Booker   MRN: [id number]    DOB: 04/13/1966   Date:07/27/2017       Progress Note  Subjective  Chief Complaint  Chief Complaint  Patient presents with  . Annual Exam  . DOT Physical    HPI   Male exam: he used to take testosterone, but has been off for months because it was not covered by insurance, last level was normal , normal libido and ED is controlled with medication.   IPSS Questionnaire (AUA-7): Over the past month.   1)  How often have you had a sensation of not emptying your bladder completely after you finish urinating?  0 - Not at all  2)  How often have you had to urinate again less than two hours after you finished urinating? 1 - Less than 1 time in 5  3)  How often have you found you stopped and started again several times when you urinated?  0 - Not at all  4) How difficult have you found it to postpone urination?  0 - Not at all  5) How often have you had a weak urinary stream?  0 - Not at all  6) How often have you had to push or strain to begin urination?  0 - Not at all  7) How many times did you most typically get up to urinate from the time you went to bed until the time you got up in the morning?  1 - 1 time  Total score:  0-7 mildly symptomatic   8-19 moderately symptomatic   20-35 severely symptomatic    DMI with ED: He has been doing well overall.  He can tell when sugar is dropping, he has mild diaphoresis before an episode, lowest level recently was down to 53 fasting, it happened after a more active day, and occurred late at night. Usually happens when he exercises and does not adjust the insulin prior to a meal. Glucose fasting: around 120's but can go up to 130's, post-prandially 150's, but it can go up to 180's. He still checks BG multiple times daily. No polyphagia, polyuria or polydipsia. ED is okay with Viagra - last dose was over the weekend. His hgbA1C was trending up x6 months, but has finally come down slightly to 7.0% (last value  was 7.5%) same time that he started to go down on Testosterone supplementation - because of coverage problems with his insurance.  Taking ace for kidney protection - due to low BP today, we will consider decreasing Lisinopril to 2.5mg . Ivan Booker seems to be helping the fasting levels. He is compliant with his medication, logging his glucose daily and exercise. He was diagnosed with DM as an young adult ( mid-20's ) and has good knowledge of his condition. He is still talking to [facility] and is compliant with diet and medical recommendations. No hypoglycemia while driving or at work. He sees Control and instrumentation engineerhealth coach at [facility] every 3 months.  BPH: used to see Urologist - Dr. Lonna Booker - years ago, had normal biopsies in the past. He was taking Cialis, however insurance no longer covers prescription, so he is now on Viagra  Hypogonadism: He tapered off of testosterone therapy around the beginning of 2018. Levels were normal in May 2018.  Energy level has still been excellent, libido is adequate, using Viagra PRN for ED.  Hyperlipidemia: at goal according to May 2018 labs.  Taking Atorvastatin daily, no cramping or chest pain.   Patient Active Problem  List   Diagnosis Date Noted  . Erectile dysfunction 06/26/2015  . BPH (benign prostatic hyperplasia) 03/20/2015  . 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 Dyslipidemia 03/07/2015  . Hypogonadism male 03/07/2015  . Obesity (BMI 30.0-34.9) 03/07/2015  . Allergic rhinitis, seasonal 03/07/2015  . Vitamin D deficiency 03/07/2015    Past Surgical History:  Procedure Laterality Date  . VASECTOMY      History reviewed. No pertinent family history.  Social History   Social History  . Marital status: Married    Spouse name: N/A  . Number of children: N/A  . Years of education: N/A   Occupational History  . Not on file.   Social History Main Topics  . Smoking status: Never Smoker  . Smokeless  tobacco: Never Used  . Alcohol use Yes     Comment: 1-2 beers on weekends  . Drug use: No  . Sexual activity: Yes    Birth control/ protection: None   Other Topics Concern  . Not on file   Social History Narrative  . No narrative on file     Current Outpatient Prescriptions:  .  aspirin 81 MG chewable tablet, Chew 1 tablet by mouth daily., Disp: , Rfl:  .  atorvastatin (LIPITOR) 20 MG tablet, TAKE 1 TABLET (20 MG TOTAL) BY MOUTH DAILY., Disp: 90 tablet, Rfl: 2 .  B-D ULTRAFINE III SHORT PEN 31G X 8 MM MISC, USE AS DIRECTED, Disp: 100 each, Rfl: 5 .  Cholecalciferol (VITAMIN D) 2000 UNITS CAPS, Take 1 capsule by mouth daily., Disp: , Rfl:  .  glucose blood (FREESTYLE LITE) test strip, Use as instructed, Disp: 100 each, Rfl: 12 .  Insulin Degludec (TRESIBA FLEXTOUCH) 200 UNIT/ML SOPN, Inject 36-40 Units into the skin daily. For fasting between 95-110 (Patient taking differently: Inject 34 Units into the skin daily. ), Disp: 9 mL, Rfl: 2 .  insulin lispro (HUMALOG KWIKPEN) 100 UNIT/ML KiwkPen, Inject 0.05-0.16 mLs (5-16 Units total) into the skin 4 (four) times daily., Disp: 45 mL, Rfl: 2 .  Lancets (FREESTYLE) lancets, Use as instructed, Disp: 100 each, Rfl: 12 .  lisinopril (PRINIVIL,ZESTRIL) 5 MG tablet, TAKE 1 TABLET BY MOUTH ONCE DAILY, Disp: 90 tablet, Rfl: 1 .  sildenafil (VIAGRA) 25 MG tablet, TAKE 1 TABLET BY MOUTH DAILY AS NEEDED FOR ERECTILE DYSFUNCTION., Disp: 10 tablet, Rfl: 2  No Known Allergies   ROS  Constitutional: Negative for fever or weight change.  Respiratory: Negative for cough and shortness of breath.   Cardiovascular: Negative for chest pain or palpitations.  Gastrointestinal: Negative for abdominal pain, no bowel changes.  Musculoskeletal: Negative for gait problem or joint swelling.  Skin: Negative for rash.  Neurological: Negative for dizziness or headache.  No other specific complaints in a complete review of systems (except as listed in HPI  above).  Objective  Vitals:   07/27/17 1334 07/27/17 1351  BP: (!) 80/50 116/74  Pulse: 60   Resp: 14   Temp: 97.9 F (36.6 C)   TempSrc: Oral   SpO2: 98%   Weight: 232 lb 8 oz (105.5 kg)   Height: 6' 1.75" (1.873 m)    Body mass index is 30.05 kg/m.  Physical Exam Constitutional: Patient appears well-developed and well-nourished. No distress.  HENT: Head: Normocephalic and atraumatic. Ears: B TMs ok, no erythema or effusion; Nose: Nose normal. Mouth/Throat: Oropharynx is clear and moist. No oropharyngeal exudate.  Eyes: Conjunctivae and EOM are normal. Pupils are equal,  round, and reactive to light. No scleral icterus.  Neck: Normal range of motion. Neck supple. No JVD present. No thyromegaly present.  Cardiovascular: Normal rate, regular rhythm and normal heart sounds.  No murmur heard. No BLE edema. Pulmonary/Chest: Effort normal and breath sounds normal. No respiratory distress. Abdominal: Soft. Bowel sounds are normal, no distension. There is no tenderness. no masses MALE GENITALIA: Normal descended testes bilaterally, no masses palpated, no hernias, no lesions, no discharge RECTAL: Prostate slightly enlarged but normal in consistency, no rectal masses or hemorrhoids Musculoskeletal: Normal range of motion, no joint effusions. No gross deformities Neurological: he is alert and oriented to person, place, and time. No cranial nerve deficit. Coordination, balance, strength, speech and gait are normal.  Skin: Skin is warm and dry. No rash noted. No erythema.  Psychiatric: Patient has a normal mood and affect. behavior is normal. Judgment and thought content normal.   Recent Results (from the past 2160 hour(s))  POCT glycosylated hemoglobin (Hb A1C)     Status: Abnormal   Collection Time: 05/06/17  9:38 AM  Result Value Ref Range   Hemoglobin A1C 7.5   POCT HgB A1C     Status: Abnormal   Collection Time: 07/27/17  1:44 PM  Result Value Ref Range   Hemoglobin A1C 7.0      Diabetic Foot Exam: Diabetic Foot Exam - Simple   Simple Foot Form Diabetic Foot exam was performed with the following findings:  Yes 07/27/2017  3:15 PM  Visual Inspection See comments:  Yes Sensation Testing Intact to touch and monofilament testing bilaterally:  Yes Pulse Check Posterior Tibialis and Dorsalis pulse intact bilaterally:  Yes Comments Callus formation       PHQ2/9: Depression screen [facility] 2/9 07/27/2017 06/24/2017 06/24/2017 03/18/2017 01/31/2017  Decreased Interest 0 0 0 0 0  Down, Depressed, Hopeless 0 0 0 0 0  PHQ - 2 Score 0 0 0 0 0     Fall Risk: Fall Risk  07/27/2017 06/24/2017 03/18/2017 01/31/2017 10/28/2016  Falls in the past year? No No No No No  Number falls in past yr: - - - - -  Comment - - - - -  Injury with Fall? - - - - -  Comment - - - - -  Follow up - - - - -     Functional Status Survey: Is the patient deaf or have difficulty hearing?: No Does the patient have difficulty seeing, even when wearing glasses/contacts?: No Does the patient have difficulty concentrating, remembering, or making decisions?: No Does the patient have difficulty walking or climbing stairs?: No Does the patient have difficulty dressing or bathing?: No Does the patient have difficulty doing errands alone such as visiting a doctor's office or shopping?: No  Current Exercise Habits: Structured exercise class, Time (Minutes): > 60, Frequency (Times/Week): 5, Weekly Exercise (Minutes/Week): 0, Intensity: Moderate Exercise limited by: None identified  Assessment & Plan  1. Encounter for routine history and physical exam for male  Discussed importance of 150 minutes of physical activity weekly, eat two servings of fish weekly, eat one serving of tree nuts ( cashews, pistachios, pecans, almonds.Marland Kitchen) every other day, eat 6 servings of fruit/vegetables daily and drink plenty of water and avoid sweet beverages.  - COMPLETE METABOLIC PANEL WITH GFR - POCT Urinalysis  Dipstick Colonoscopy is up to date at age 11  2. 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)  Doing well, only one episode of hypoglycemia, and he felt the  symptoms, he was awake , had a snack and symptoms resolved, hgbA1C has improved - POCT HgB A1C  3. Dyslipidemia  Continue medication   4. Hypogonadism male  Doing well, off testosterone  5. Vitamin D deficiency   6. Benign prostatic hyperplasia with urinary frequency  Last PSA 01/2017  7. Diabetic erectile dysfunction associated with type 1 diabetes mellitus (HCC)  He left before he got flu shot

## 2017-07-28 DIAGNOSIS — Z23 Encounter for immunization: Secondary | ICD-10-CM | POA: Diagnosis not present

## 2017-07-28 DIAGNOSIS — Z0000 Encounter for general adult medical examination without abnormal findings: Secondary | ICD-10-CM | POA: Diagnosis not present

## 2017-07-28 DIAGNOSIS — E1049 Type 1 diabetes mellitus with other diabetic neurological complication: Secondary | ICD-10-CM | POA: Diagnosis not present

## 2017-07-28 DIAGNOSIS — N521 Erectile dysfunction due to diseases classified elsewhere: Secondary | ICD-10-CM | POA: Diagnosis not present

## 2017-07-28 LAB — COMPLETE METABOLIC PANEL WITH GFR
AG Ratio: 1.9 (calc) (ref 1.0–2.5)
ALBUMIN MSPROF: 4.1 g/dL (ref 3.6–5.1)
ALT: 20 U/L (ref 9–46)
AST: 15 U/L (ref 10–35)
Alkaline phosphatase (APISO): 85 U/L (ref 40–115)
BUN: 13 mg/dL (ref 7–25)
CALCIUM: 9.1 mg/dL (ref 8.6–10.3)
CO2: 28 mmol/L (ref 20–32)
CREATININE: 0.93 mg/dL (ref 0.70–1.33)
Chloride: 103 mmol/L (ref 98–110)
GFR, EST NON AFRICAN AMERICAN: 93 mL/min/{1.73_m2} (ref >=60)
GFR, Est African American: 108 mL/min/{1.73_m2} (ref >=60)
GLOBULIN: 2.2 g/dL (ref 1.9–3.7)
GLUCOSE: 140 mg/dL — AB (ref 65–99)
Potassium: 4.3 mmol/L (ref 3.5–5.3)
SODIUM: 138 mmol/L (ref 135–146)
Total Bilirubin: 1.1 mg/dL (ref 0.2–1.2)
Total Protein: 6.3 g/dL (ref 6.1–8.1)

## 2017-07-28 LAB — POCT URINALYSIS DIPSTICK
Bilirubin, UA: NEGATIVE
Blood, UA: NEGATIVE
GLUCOSE UA: NEGATIVE
Ketones, UA: NEGATIVE
LEUKOCYTES UA: NEGATIVE
Nitrite, UA: NEGATIVE
PROTEIN UA: NEGATIVE
SPEC GRAV UA: 1.01 (ref 1.010–1.025)
UROBILINOGEN UA: 0.2 U/dL
pH, UA: 6 (ref 5.0–8.0)

## 2017-08-01 ENCOUNTER — Ambulatory Visit: Payer: 59 | Admitting: Family Medicine

## 2017-08-08 ENCOUNTER — Other Ambulatory Visit: Payer: Self-pay | Admitting: Family Medicine

## 2017-08-08 DIAGNOSIS — N521 Erectile dysfunction due to diseases classified elsewhere: Principal | ICD-10-CM

## 2017-08-08 DIAGNOSIS — E1049 Type 1 diabetes mellitus with other diabetic neurological complication: Secondary | ICD-10-CM

## 2017-08-10 DIAGNOSIS — E119 Type 2 diabetes mellitus without complications: Secondary | ICD-10-CM | POA: Diagnosis not present

## 2017-08-10 LAB — HM DIABETES EYE EXAM

## 2017-08-12 ENCOUNTER — Encounter: Payer: Self-pay | Admitting: Family Medicine

## 2017-08-23 ENCOUNTER — Telehealth: Payer: Self-pay | Admitting: Pharmacist

## 2017-08-23 NOTE — Patient Outreach (Signed)
[facility] Care Management  08/23/2017  [name] Jul 29, 1966 [id number]   Left HIPAA-compliant message for patient via phone advising that disease self-management services will be changing in 2019 and that our upcoming appointment has been canceled. Will attempt to contact patient again next week.   Will close case to [facility] To Wellness diabetes program  Allena Katzaroline E Suann Klier, Pharm.D., BCPS PGY2 Ambulatory Care Pharmacy Resident Phone: [phone]

## 2017-09-16 ENCOUNTER — Ambulatory Visit: Payer: 59 | Admitting: Pharmacist

## 2017-09-16 ENCOUNTER — Telehealth: Payer: Self-pay | Admitting: Pharmacist

## 2017-09-16 NOTE — Patient Outreach (Signed)
Called patient to remind him of Link to Wellness Case closure. He answers and expresses understanding. Got a letter with instructions on how to sign up with active health management.   Allena Katzaroline E Welles, Pharm.D., BCPS PGY2 Ambulatory Care Pharmacy Resident Phone: [phone]

## 2017-09-29 ENCOUNTER — Other Ambulatory Visit: Payer: Self-pay | Admitting: Family Medicine

## 2017-09-29 NOTE — Telephone Encounter (Signed)
Refill request for general medication: [facility].   Last office visit: 07/27/2017  Next follow up: 10/26/2017

## 2017-10-24 ENCOUNTER — Other Ambulatory Visit: Payer: Self-pay | Admitting: Family Medicine

## 2017-10-24 NOTE — Telephone Encounter (Signed)
Refill Request for Cholesterol medication: Atorvastatin to [facility]  Last visit: 07/27/2017  Lab Results  Component Value Date   CHOL 135 01/31/2017   HDL 50 01/31/2017   LDLCALC 74 01/31/2017   TRIG 54 01/31/2017   CHOLHDL 2.7 01/31/2017    Follow up on 10/26/2017

## 2017-10-26 ENCOUNTER — Encounter: Payer: Self-pay | Admitting: Family Medicine

## 2017-10-26 ENCOUNTER — Ambulatory Visit: Payer: 59 | Admitting: Family Medicine

## 2017-10-26 VITALS — BP 116/60 | HR 76 | Temp 98.4°F | Resp 16 | Ht 74.0 in | Wt 240.5 lb

## 2017-10-26 DIAGNOSIS — E1049 Type 1 diabetes mellitus with other diabetic neurological complication: Secondary | ICD-10-CM

## 2017-10-26 DIAGNOSIS — E559 Vitamin D deficiency, unspecified: Secondary | ICD-10-CM

## 2017-10-26 DIAGNOSIS — E291 Testicular hypofunction: Secondary | ICD-10-CM | POA: Diagnosis not present

## 2017-10-26 DIAGNOSIS — N521 Erectile dysfunction due to diseases classified elsewhere: Secondary | ICD-10-CM | POA: Diagnosis not present

## 2017-10-26 DIAGNOSIS — E669 Obesity, unspecified: Secondary | ICD-10-CM | POA: Diagnosis not present

## 2017-10-26 DIAGNOSIS — E785 Hyperlipidemia, unspecified: Secondary | ICD-10-CM | POA: Diagnosis not present

## 2017-10-26 LAB — POCT GLYCOSYLATED HEMOGLOBIN (HGB A1C): HEMOGLOBIN A1C: 7.1

## 2017-10-26 MED ORDER — FREESTYLE LIBRE 14 DAY SENSOR MISC: 1.0000 | 12 refills | Status: DC

## 2017-10-26 MED ORDER — FREESTYLE LIBRE 14 DAY READER DEVI: 1.0000 | Freq: Every day | 0 refills | Status: DC

## 2017-10-26 NOTE — Progress Notes (Signed)
Name: Ivan Booker   MRN: [id number]    DOB: 12-13-1965   Date:10/26/2017       Progress Note  Subjective  Chief Complaint  Chief Complaint  Patient presents with  . Medication Refill  . Diabetes    Checks 4-5 daily, Lowest-60 Average-150 Highest-260  . Hyperlipidemia  . Benign Prostatic Hypertrophy  . Hypogonadism    HPI  DMI with ED: he is doing well . No recent episodes of hypoglycemia, many months. He can tell when sugar is dropping, he has mild diaphoresis before an episode, Glucose fasting: around 150's, advised to titrate the dose of Tresiba by 2 units every 4 days and monitor. He still checks fsbs multiple times daily, we will switch to free style libre. No polyphagia, polyuria or polydipsia. ED is okay with Viagra ( instead of Cialis ), changed because of formulary change. His hgbA1C has been going up for the past 12 months, same time that he started to go down on Testosterone supplementation - because of coverage problems with his insurance.  Taking ace for kidney protection.  He is compliant with his medication, logging his glucose daily and exercise. He was diagnosed with DM as an young adult ( mid-20's ) and has good knowledge of his condition. He is still talking dietician from [facility] and is compliant with diet and medical recommendations. No hypoglycemia while driving or at work. He sees Control and instrumentation engineer at [facility] every 3 months.   BPH: used to see Urologist - Dr. Lonna Cobb - years ago, had normal biopsies in the past. He was taking Cialis, however insurance no longer covers prescription, so he is now on ViagraHe has nocturia once or twice per night, no dribbling, denies weak stream.    Hypogonadism: he has been on testosterone for about 5 years for libido and  ED, he has been out of medication for months now He has not noticed much change in libido, or ED.   Hyperlipidemia: at goal, taking Atorvastatin daily, no cramping or chest pain   Patient Active Problem List   Diagnosis Date Noted  . Erectile dysfunction 06/26/2015  . BPH (benign prostatic hyperplasia) 03/20/2015  . 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 Dyslipidemia 03/07/2015  . Hypogonadism male 03/07/2015  . Obesity (BMI 30.0-34.9) 03/07/2015  . Allergic rhinitis, seasonal 03/07/2015  . Vitamin D deficiency 03/07/2015    Past Surgical History:  Procedure Laterality Date  . VASECTOMY      History reviewed. No pertinent family history.  Social History   Socioeconomic History  . Marital status: Married    Spouse name: Not on file  . Number of children: Not on file  . Years of education: Not on file  . Highest education level: Not on file  Social Needs  . Financial resource strain: Not on file  . Food insecurity - worry: Not on file  . Food insecurity - inability: Not on file  . Transportation needs - medical: Not on file  . Transportation needs - non-medical: Not on file  Occupational History  . Not on file  Tobacco Use  . Smoking status: Never Smoker  . Smokeless tobacco: Never Used  Substance and Sexual Activity  . Alcohol use: Yes    Comment: 1-2 beers on weekends  . Drug use: No  . Sexual activity: Yes    Birth control/protection: None  Other Topics Concern  . Not on file  Social History Narrative  . Not on file  Current Outpatient Medications:  .  aspirin 81 MG chewable tablet, Chew 1 tablet by mouth daily., Disp: , Rfl:  .  atorvastatin (LIPITOR) 20 MG tablet, TAKE 1 TABLET (20 MG TOTAL) BY MOUTH DAILY., Disp: 90 tablet, Rfl: 2 .  B-D ULTRAFINE III SHORT PEN 31G X 8 MM MISC, USE AS DIRECTED, Disp: 100 each, Rfl: 5 .  Cholecalciferol (VITAMIN D) 2000 UNITS CAPS, Take 1 capsule by mouth daily., Disp: , Rfl:  .  glucose blood (FREESTYLE LITE) test strip, Use as instructed, Disp: 100 each, Rfl: 12 .  insulin lispro (HUMALOG KWIKPEN) 100 UNIT/ML KiwkPen, Inject 0.05-0.16 mLs (5-16 Units total) into the skin 4  (four) times daily., Disp: 45 mL, Rfl: 2 .  Lancets (FREESTYLE) lancets, Use as instructed, Disp: 100 each, Rfl: 12 .  lisinopril (PRINIVIL,ZESTRIL) 5 MG tablet, TAKE 1 TABLET BY MOUTH ONCE DAILY (Patient taking differently: TAKE HALF TABLET BY MOUTH ONCE DAILY), Disp: 90 tablet, Rfl: 1 .  sildenafil (VIAGRA) 25 MG tablet, TAKE 1 TABLET BY MOUTH DAILY AS NEEDED FOR ERECTILE DYSFUNCTION., Disp: 10 tablet, Rfl: 2 .  TRESIBA FLEXTOUCH 200 UNIT/ML SOPN, INJECT 34 UNITS INTO THE SKIN DAILY, Disp: 9 mL, Rfl: 2  No Known Allergies   ROS  Constitutional: Negative for fever or weight change.  Respiratory: Negative for cough and shortness of breath.   Cardiovascular: Negative for chest pain or palpitations.  Gastrointestinal: Negative for abdominal pain, no bowel changes.  Musculoskeletal: Negative for gait problem or joint swelling.  Skin: Negative for rash.  Neurological: Negative for dizziness or headache.  No other specific complaints in a complete review of systems (except as listed in HPI above).  Objective  Vitals:   10/26/17 1330  BP: 116/60  Pulse: 76  Resp: 16  Temp: 98.4 F (36.9 C)  TempSrc: Oral  SpO2: 96%  Weight: 240 lb 8 oz (109.1 kg)  Height: 6\' 2"  (1.88 m)    Body mass index is 30.88 kg/m.  Physical Exam  Constitutional: Patient appears well-developed and well-nourished. ObeseNo distress.  HEENT: head atraumatic, normocephalic, pupils equal and reactive to light, neck supple, throat within normal limits Cardiovascular: Normal rate, regular rhythm and normal heart sounds.  No murmur heard. No BLE edema. Pulmonary/Chest: Effort normal and breath sounds normal. No respiratory distress. Abdominal: Soft.  There is no tenderness. Psychiatric: Patient has a normal mood and affect. behavior is normal. Judgment and thought content normal.  Recent Results (from the past 2160 hour(s))  HM DIABETES EYE EXAM     Status: None   Collection Time: 08/10/17 12:00 AM  Result  Value Ref Range   HM Diabetic Eye Exam No Retinopathy No Retinopathy    Comment: [facility], Dr. Dellie Burnsingeldein  POCT HgB A1C     Status: None   Collection Time: 10/26/17  1:45 PM  Result Value Ref Range   Hemoglobin A1C 7.1      PHQ2/9: Depression screen [facility] 2/9 10/26/2017 07/27/2017 06/24/2017 06/24/2017 03/18/2017  Decreased Interest 0 0 0 0 0  Down, Depressed, Hopeless 0 0 0 0 0  PHQ - 2 Score 0 0 0 0 0     Fall Risk: Fall Risk  10/26/2017 07/27/2017 06/24/2017 03/18/2017 01/31/2017  Falls in the past year? No No No No No  Number falls in past yr: - - - - -  Comment - - - - -  Injury with Fall? - - - - -  Comment - - - - -  Follow  up - - - - -     Functional Status Survey: Is the patient deaf or have difficulty hearing?: No Does the patient have difficulty seeing, even when wearing glasses/contacts?: No Does the patient have difficulty concentrating, remembering, or making decisions?: No Does the patient have difficulty walking or climbing stairs?: No Does the patient have difficulty dressing or bathing?: No Does the patient have difficulty doing errands alone such as visiting a doctor's office or shopping?: No   Assessment & Plan  1. 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)  - POCT HgB A1C Filled forms for DMV , no episodes of hypoglycemia in a long time  2. Dyslipidemia  On Atorvastatin and denies side effect  3. Hypogonadism male  Off medication, last level was normal   4. Vitamin D deficiency  Continue supplementation   5. Obesity (BMI 30.0-34.9)  He will resume physical activity and will cut down on portion size

## 2017-10-31 ENCOUNTER — Other Ambulatory Visit: Payer: Self-pay | Admitting: Family Medicine

## 2017-11-01 ENCOUNTER — Other Ambulatory Visit: Payer: Self-pay | Admitting: Family Medicine

## 2017-11-01 NOTE — Telephone Encounter (Signed)
Too expensive. Wife is going to check to see if they are covered by the diabetes program at the hospital.

## 2017-11-01 NOTE — Telephone Encounter (Signed)
Refill request for diabetic medication:   Glucose Test Strips  Last office visit pertaining to diabetes: 10/26/2017   Lab Results  Component Value Date   HGBA1C 7.1 10/26/2017     Follow-up on file. 02/03/2018

## 2017-11-01 NOTE — Telephone Encounter (Signed)
What about the free style libre? Was it not covered by insurance?

## 2017-11-15 ENCOUNTER — Telehealth: Payer: Self-pay

## 2017-11-15 ENCOUNTER — Other Ambulatory Visit: Payer: Self-pay | Admitting: Family Medicine

## 2017-11-15 MED ORDER — SILDENAFIL CITRATE 20 MG PO TABS
60.0000 mg | ORAL_TABLET | Freq: Every day | ORAL | 2 refills | Status: DC | PRN
Start: 2017-11-15 — End: 2018-06-13

## 2017-11-15 NOTE — Telephone Encounter (Signed)
Copied from CRM [id number]. Topic: General - Other >> Nov 15, 2017  2:35 PM Percival SpanishKennedy, Cheryl W wrote:  Pt cal lto ask for an increase in the below med   sildenafil (VIAGRA) 25 MG tablet  Pt is asking for a RX for 60mg  which is taking 3  20mg  tablets that was written previously  [facility]armacy  [facility]

## 2018-01-11 ENCOUNTER — Other Ambulatory Visit: Payer: Self-pay | Admitting: Family Medicine

## 2018-01-11 DIAGNOSIS — E1049 Type 1 diabetes mellitus with other diabetic neurological complication: Secondary | ICD-10-CM

## 2018-01-11 DIAGNOSIS — N521 Erectile dysfunction due to diseases classified elsewhere: Principal | ICD-10-CM

## 2018-01-11 NOTE — Telephone Encounter (Signed)
Request for diabetes medication: Ivan Booker Buryresiba   Last office visit pertaining to diabetes: 10/26/2017   Lab Results  Component Value Date   HGBA1C 7.1 10/26/2017    Follow up 02/03/2018   Hypertension medication request: Lisinopril   BP Readings from Last 3 Encounters:  10/26/17 116/60  07/27/17 116/74  06/24/17 102/62    Lab Results  Component Value Date   CREATININE 0.93 07/28/2017   BUN 13 07/28/2017   NA 138 07/28/2017   K 4.3 07/28/2017   CL 103 07/28/2017   CO2 28 07/28/2017

## 2018-01-25 ENCOUNTER — Ambulatory Visit (INDEPENDENT_AMBULATORY_CARE_PROVIDER_SITE_OTHER): Payer: 59 | Admitting: Family Medicine

## 2018-01-25 ENCOUNTER — Telehealth: Payer: Self-pay | Admitting: Family Medicine

## 2018-01-25 VITALS — BP 96/56 | HR 66 | Temp 98.7°F | Resp 16 | Ht 74.0 in | Wt 225.0 lb

## 2018-01-25 DIAGNOSIS — E291 Testicular hypofunction: Secondary | ICD-10-CM | POA: Diagnosis not present

## 2018-01-25 DIAGNOSIS — R350 Frequency of micturition: Secondary | ICD-10-CM

## 2018-01-25 DIAGNOSIS — E785 Hyperlipidemia, unspecified: Secondary | ICD-10-CM

## 2018-01-25 DIAGNOSIS — E1049 Type 1 diabetes mellitus with other diabetic neurological complication: Secondary | ICD-10-CM

## 2018-01-25 DIAGNOSIS — N401 Enlarged prostate with lower urinary tract symptoms: Secondary | ICD-10-CM

## 2018-01-25 DIAGNOSIS — E8881 Metabolic syndrome: Secondary | ICD-10-CM

## 2018-01-25 DIAGNOSIS — N521 Erectile dysfunction due to diseases classified elsewhere: Secondary | ICD-10-CM | POA: Diagnosis not present

## 2018-01-25 LAB — POCT UA - MICROALBUMIN: Microalbumin Ur, POC: 20 mg/L

## 2018-01-25 LAB — POCT GLYCOSYLATED HEMOGLOBIN (HGB A1C): Hemoglobin A1C: 7.3

## 2018-01-25 MED ORDER — METFORMIN HCL ER 500 MG PO TB24: 500.0000 mg | ORAL_TABLET | Freq: Every day | ORAL | 0 refills | Status: DC

## 2018-01-25 NOTE — Progress Notes (Signed)
Name: Ivan Booker   MRN: [id number]    DOB: 19-Nov-1965   Date:01/25/2018       Progress Note  Subjective  Chief Complaint  Chief Complaint  Patient presents with  . Diabetes    Checks 3-4x daily, Lowest-44 Average-145 Highest-353  . Medication Refill  . Hyperlipidemia  . Hypogonadism    HPI  DMI with ED: he is doing well . He had one episode of hypoglycemia when he first got up within the past month, but he ate breakfast and it went right back to normal range, no episodes of hypoglycemia during the day.Marland Kitchen He can tell when sugar is dropping, he has mild diaphoresis before an episode/ Glucose fasting:past 2 weeks average of 130 . Currently on Tresiba 36 units, we will add Metformin to see if the reason glucose has been higher than normal for him is secondary to insulin resistance.Marland Kitchen He still checks fsbs multiple times daily, did not like the [employer] - not as accurate. No polyphagia, polyuria or polydipsia. ED is okay with Viagra ( instead of Cialis ), changed because of formulary change. His hgbA1C has been going up for the past 18 months  same time that he started to go down on Testosterone supplementation - because of coverage problems with his insurance.Taking ace for kidney protection, but no history of microalbuminuria and bp is low we will stop ace.  He is compliant with his medication, logging his glucose daily and exercise. He was diagnosed with DM as an young adult ( mid-20's ) and has good knowledge of his condition. He is still talking dietician from [facility] and is compliant with diet and medical recommendations. No hypoglycemia while driving or at work.   BPH: used to see Urologist - Dr. Lonna Cobb - years ago, had normal biopsies in the past. He was taking Cialis, however insurance no longer covers prescription, so he is now on ViagraHe has nocturia once  per night, no dribbling, denies weak stream, due for PSA level .  Hypogonadism: he has been on testosterone for about 5  years for libido and  ED, he states medication with energy, glucose control and libido.   Hyperlipidemia: at goal, taking Atorvastatin daily, no cramping or chest pain. Unchanged     Patient Active Problem List   Diagnosis Date Noted  . Erectile dysfunction 06/26/2015  . BPH (benign prostatic hyperplasia) 03/20/2015  . 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 Dyslipidemia 03/07/2015  . Hypogonadism male 03/07/2015  . Obesity (BMI 30.0-34.9) 03/07/2015  . Allergic rhinitis, seasonal 03/07/2015  . Vitamin D deficiency 03/07/2015    Past Surgical History:  Procedure Laterality Date  . VASECTOMY      History reviewed. No pertinent family history.  Social History   Socioeconomic History  . Marital status: Married    Spouse name: Not on file  . Number of children: Not on file  . Years of education: Not on file  . Highest education level: Not on file  Occupational History  . Not on file  Social Needs  . Financial resource strain: Not on file  . Food insecurity:    Worry: Not on file    Inability: Not on file  . Transportation needs:    Medical: Not on file    Non-medical: Not on file  Tobacco Use  . Smoking status: Never Smoker  . Smokeless tobacco: Never Used  Substance and Sexual Activity  . Alcohol use: Yes  Comment: 1-2 beers on weekends  . Drug use: No  . Sexual activity: Yes    Birth control/protection: None  Lifestyle  . Physical activity:    Days per week: Not on file    Minutes per session: Not on file  . Stress: Not on file  Relationships  . Social connections:    Talks on phone: Not on file    Gets together: Not on file    Attends religious service: Not on file    Active member of club or organization: Not on file    Attends meetings of clubs or organizations: Not on file    Relationship status: Not on file  . Intimate partner violence:    Fear of current or ex partner: Not on file     Emotionally abused: Not on file    Physically abused: Not on file    Forced sexual activity: Not on file  Other Topics Concern  . Not on file  Social History Narrative  . Not on file     Current Outpatient Medications:  .  aspirin 81 MG chewable tablet, Chew 1 tablet by mouth daily., Disp: , Rfl:  .  atorvastatin (LIPITOR) 20 MG tablet, TAKE 1 TABLET (20 MG TOTAL) BY MOUTH DAILY., Disp: 90 tablet, Rfl: 2 .  B-D ULTRAFINE III SHORT PEN 31G X 8 MM MISC, USE AS DIRECTED, Disp: 100 each, Rfl: 5 .  Cholecalciferol (VITAMIN D) 2000 UNITS CAPS, Take 1 capsule by mouth daily., Disp: , Rfl:  .  glucose blood (FREESTYLE LITE) test strip, USE AS INSTRUCTED, Disp: 200 each, Rfl: 12 .  HUMALOG KWIKPEN 100 UNIT/ML KiwkPen, INJECT 5 TO 16 UNITS INTO THE SKIN 4 TIMES DAILY. (Patient taking differently: INJECT 5 TO 16 UNITS INTO THE SKIN 2 TIMES DAILY.), Disp: 45 mL, Rfl: 2 .  Lancets (FREESTYLE) lancets, Use as instructed, Disp: 100 each, Rfl: 12 .  lisinopril (PRINIVIL,ZESTRIL) 5 MG tablet, TAKE HALF TABLET BY MOUTH ONCE DAILY, Disp: 45 tablet, Rfl: 1 .  sildenafil (REVATIO) 20 MG tablet, Take 3 tablets (60 mg total) by mouth daily as needed., Disp: 50 tablet, Rfl: 2 .  TRESIBA FLEXTOUCH 200 UNIT/ML SOPN, INJECT 34 UNITS INTO THE SKIN DAILY (Patient taking differently: Inject 36 Units into the skin daily. ), Disp: 9 mL, Rfl: 2  No Known Allergies   ROS  Constitutional: Negative for fever or weight change.  Respiratory: Negative for cough and shortness of breath.   Cardiovascular: Negative for chest pain or palpitations.  Gastrointestinal: Negative for abdominal pain, no bowel changes.  Musculoskeletal: Negative for gait problem or joint swelling.  Skin: Negative for rash.  Neurological: Negative for dizziness or headache.  No other specific complaints in a complete review of systems (except as listed in HPI above).  Objective  Vitals:   01/25/18 1626  BP: (!) 96/56  Pulse: 66  Resp: 16   Temp: 98.7 F (37.1 C)  TempSrc: Oral  SpO2: 97%  Weight: 225 lb (102.1 kg)  Height:  (1.88 m)    Body mass index is 28.89 kg/m.  Physical Exam  Constitutional: Patient appears well-developed and well-nourished. Overweight.  No distress.  HEENT: head atraumatic, normocephalic, pupils equal and reactive to light,  neck supple, throat within normal limits Cardiovascular: Normal rate, regular rhythm and normal heart sounds.  No murmur heard. No BLE edema. Pulmonary/Chest: Effort normal and breath sounds normal. No respiratory distress.  Abdominal: Soft.  There is no tenderness. Psychiatric: Patient has a  normal mood and affect. behavior is normal. Judgment and thought content normal.  Recent Results (from the past 2160 hour(s))  POCT UA - Microalbumin     Status: Abnormal   Collection Time: 01/25/18  4:31 PM  Result Value Ref Range   Microalbumin Ur, POC 20 mg/L   Creatinine, POC  mg/dL   Albumin/Creatinine Ratio, Urine, POC    POCT HgB A1C     Status: Abnormal   Collection Time: 01/25/18  4:32 PM  Result Value Ref Range   Hemoglobin A1C 7.3      PHQ2/9: Depression screen [facility] 2/9 01/25/2018 10/26/2017 07/27/2017 06/24/2017 06/24/2017  Decreased Interest 0 0 0 0 0  Down, Depressed, Hopeless 0 0 0 0 0  PHQ - 2 Score 0 0 0 0 0     Fall Risk: Fall Risk  01/25/2018 10/26/2017 07/27/2017 06/24/2017 03/18/2017  Falls in the past year? No No No No No  Number falls in past yr: - - - - -  Comment - - - - -  Injury with Fall? - - - - -  Comment - - - - -  Follow up - - - - -     Functional Status Survey: Is the patient deaf or have difficulty hearing?: No Does the patient have difficulty seeing, even when wearing glasses/contacts?: No Does the patient have difficulty concentrating, remembering, or making decisions?: No Does the patient have difficulty walking or climbing stairs?: No Does the patient have difficulty dressing or bathing?: No Does the patient have difficulty  doing errands alone such as visiting a doctor's office or shopping?: No    Assessment & Plan  1. 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)  - POCT HgB A1C - POCT UA - Microalbumin - COMPLETE METABOLIC PANEL WITH GFR  2. Dyslipidemia  - Lipid panel  3. Insulin resistance  - metFORMIN (GLUCOPHAGE-XR) 500 MG 24 hr tablet; Take 1 tablet (500 mg total) by mouth daily with breakfast.  Dispense: 90 tablet; Refill: 0  4. Hypogonadism male  On testosterone and does not need refill at this time  5. Benign prostatic hyperplasia with urinary frequency  - CBC with Differential/Platelet - PSA

## 2018-01-25 NOTE — Telephone Encounter (Signed)
Routing to correct office

## 2018-01-25 NOTE — Telephone Encounter (Unsigned)
Copied from CRM [id number]. Topic: Quick Communication - See Telephone Encounter >> Jan 25, 2018  3:27 PM Floria Raveling A wrote: CRM for notification. See Telephone encounter for: 01/25/18. [facility] called in and said that pt is trying to make her DEXA scan appt but she stated that the diagnosis is not an acceptable one??    Best number to call her back  [phone] ext [phone]

## 2018-01-25 NOTE — Telephone Encounter (Signed)
This is not the correct patient. Please send with corrected chart.

## 2018-01-26 DIAGNOSIS — E1049 Type 1 diabetes mellitus with other diabetic neurological complication: Secondary | ICD-10-CM | POA: Diagnosis not present

## 2018-01-26 DIAGNOSIS — R350 Frequency of micturition: Secondary | ICD-10-CM | POA: Diagnosis not present

## 2018-01-26 DIAGNOSIS — E785 Hyperlipidemia, unspecified: Secondary | ICD-10-CM | POA: Diagnosis not present

## 2018-01-26 DIAGNOSIS — N521 Erectile dysfunction due to diseases classified elsewhere: Secondary | ICD-10-CM | POA: Diagnosis not present

## 2018-01-26 LAB — CBC WITH DIFFERENTIAL/PLATELET
BASOS PCT: 1.3 %
Basophils Absolute: 81 cells/uL (ref 0–200)
EOS PCT: 3.2 %
Eosinophils Absolute: 198 cells/uL (ref 15–500)
HCT: 46.6 % (ref 38.5–50.0)
Hemoglobin: 16.7 g/dL (ref 13.2–17.1)
Lymphs Abs: 1786 cells/uL (ref 850–3900)
MCH: 30.7 pg (ref 27.0–33.0)
MCHC: 35.8 g/dL (ref 32.0–36.0)
MCV: 85.7 fL (ref 80.0–100.0)
MONOS PCT: 9.7 %
MPV: 10.2 fL (ref 7.5–12.5)
Neutro Abs: 3534 cells/uL (ref 1500–7800)
Neutrophils Relative %: 57 %
PLATELETS: 204 10*3/uL (ref 140–400)
RBC: 5.44 10*6/uL (ref 4.20–5.80)
RDW: 12.1 % (ref 11.0–15.0)
TOTAL LYMPHOCYTE: 28.8 %
WBC mixed population: 601 cells/uL (ref 200–950)
WBC: 6.2 10*3/uL (ref 3.8–10.8)

## 2018-01-26 LAB — COMPLETE METABOLIC PANEL WITH GFR
AG RATIO: 2 (calc) (ref 1.0–2.5)
ALKALINE PHOSPHATASE (APISO): 87 U/L (ref 40–115)
ALT: 15 U/L (ref 9–46)
AST: 15 U/L (ref 10–35)
Albumin: 4.3 g/dL (ref 3.6–5.1)
BILIRUBIN TOTAL: 1.2 mg/dL (ref 0.2–1.2)
BUN: 17 mg/dL (ref 7–25)
CHLORIDE: 104 mmol/L (ref 98–110)
CO2: 29 mmol/L (ref 20–32)
Calcium: 9.3 mg/dL (ref 8.6–10.3)
Creat: 0.95 mg/dL (ref 0.70–1.33)
GFR, EST NON AFRICAN AMERICAN: 90 mL/min/{1.73_m2} (ref >=60)
GFR, Est African American: 105 mL/min/{1.73_m2} (ref >=60)
GLOBULIN: 2.1 g/dL (ref 1.9–3.7)
Glucose, Bld: 95 mg/dL (ref 65–99)
POTASSIUM: 4.3 mmol/L (ref 3.5–5.3)
SODIUM: 139 mmol/L (ref 135–146)
Total Protein: 6.4 g/dL (ref 6.1–8.1)

## 2018-01-26 LAB — LIPID PANEL
Cholesterol: 147 mg/dL (ref <200)
HDL: 53 mg/dL (ref >40)
LDL Cholesterol (Calc): 80 mg/dL (calc)
NON-HDL CHOLESTEROL (CALC): 94 mg/dL (ref <130)
TRIGLYCERIDES: 49 mg/dL (ref <150)
Total CHOL/HDL Ratio: 2.8 (calc) (ref <5.0)

## 2018-01-26 LAB — PSA: PSA: 0.5 ng/mL (ref <=4.0)

## 2018-01-26 NOTE — Telephone Encounter (Signed)
Is this the right chart. In the conversation you stated that it was a male. The chart that you sent is from a patient that was seen here yesterday.

## 2018-01-31 ENCOUNTER — Telehealth: Payer: Self-pay

## 2018-01-31 DIAGNOSIS — N521 Erectile dysfunction due to diseases classified elsewhere: Principal | ICD-10-CM

## 2018-01-31 DIAGNOSIS — E1049 Type 1 diabetes mellitus with other diabetic neurological complication: Secondary | ICD-10-CM

## 2018-01-31 NOTE — Telephone Encounter (Signed)
Patient was notified about his lab results and was surprised his glucose reading was normal and his A1C was elevated  .02 from last check. Patient thought you would have recheck his A1C again since our machine he states has been a little off in the past.

## 2018-01-31 NOTE — Telephone Encounter (Signed)
I can order it to be done at lab, but insurance may not pay for it. I will place order

## 2018-01-31 NOTE — Telephone Encounter (Signed)
Copied from CRM [id number]. Topic: Quick Communication - Lab Results >> Jan 31, 2018  2:16 PM Percival Spanish wrote:  Pt would like a call back about  his lab result he had last week   [phone]

## 2018-02-02 NOTE — Telephone Encounter (Signed)
Pt called in to ask provider to place a order for a A1C check. Pt says that he would like to have it rechecked, fasting. Pt feels that his number could be better fasting. Pt would like to have order placed in Epic and he will go to an outpt lab and have drawn at the location that his wife works at.    Please assist further.

## 2018-02-02 NOTE — Telephone Encounter (Signed)
Called patient to inform him that A1c was ordered. Left vm. Unable to reach him.

## 2018-02-03 ENCOUNTER — Ambulatory Visit: Payer: 59 | Admitting: Family Medicine

## 2018-02-03 ENCOUNTER — Other Ambulatory Visit
Admission: RE | Admit: 2018-02-03 | Discharge: 2018-02-03 | Disposition: A | Discharge status: Home / Self Care | Payer: 59 | Source: Ambulatory Visit | Attending: Family Medicine | Admitting: Family Medicine

## 2018-02-03 DIAGNOSIS — N521 Erectile dysfunction due to diseases classified elsewhere: Secondary | ICD-10-CM | POA: Insufficient documentation

## 2018-02-03 DIAGNOSIS — E1049 Type 1 diabetes mellitus with other diabetic neurological complication: Secondary | ICD-10-CM | POA: Insufficient documentation

## 2018-02-03 LAB — HEMOGLOBIN A1C
Hgb A1c MFr Bld: 7.2 % — ABNORMAL HIGH (ref 4.8–5.6)
Mean Plasma Glucose: 159.94 mg/dL

## 2018-02-03 NOTE — Addendum Note (Signed)
Addended by: Jodean Lima H on: 02/03/2018 09:40 AM   Modules accepted: Orders

## 2018-05-02 ENCOUNTER — Encounter: Payer: Self-pay | Admitting: Family Medicine

## 2018-05-02 ENCOUNTER — Ambulatory Visit: Payer: 59 | Admitting: Family Medicine

## 2018-05-02 VITALS — BP 102/60 | HR 69 | Temp 97.9°F | Resp 14 | Ht 74.0 in | Wt 230.7 lb

## 2018-05-02 DIAGNOSIS — N521 Erectile dysfunction due to diseases classified elsewhere: Secondary | ICD-10-CM | POA: Diagnosis not present

## 2018-05-02 DIAGNOSIS — E559 Vitamin D deficiency, unspecified: Secondary | ICD-10-CM

## 2018-05-02 DIAGNOSIS — E1049 Type 1 diabetes mellitus with other diabetic neurological complication: Secondary | ICD-10-CM | POA: Diagnosis not present

## 2018-05-02 DIAGNOSIS — E291 Testicular hypofunction: Secondary | ICD-10-CM

## 2018-05-02 DIAGNOSIS — E1069 Type 1 diabetes mellitus with other specified complication: Secondary | ICD-10-CM | POA: Diagnosis not present

## 2018-05-02 DIAGNOSIS — E8881 Metabolic syndrome: Secondary | ICD-10-CM

## 2018-05-02 DIAGNOSIS — E785 Hyperlipidemia, unspecified: Secondary | ICD-10-CM | POA: Diagnosis not present

## 2018-05-02 LAB — POCT GLYCOSYLATED HEMOGLOBIN (HGB A1C): HEMOGLOBIN A1C: 7.1 % — AB (ref 4.0–5.6)

## 2018-05-02 MED ORDER — METFORMIN HCL ER 500 MG PO TB24: 500.0000 mg | ORAL_TABLET | Freq: Every day | ORAL | 0 refills | Status: DC

## 2018-05-02 NOTE — Progress Notes (Signed)
Name: Ivan Booker   MRN: [id number]    DOB: 1966/05/20   Date:05/03/2018       Progress Note  Subjective  Chief Complaint  Chief Complaint  Patient presents with  . Diabetes  . Hyperlipidemia  . Hypogonadism  . Benign Prostatic Hypertrophy    HPI  DMI with ED: he is doing well . Reviewed his meter, only time that glucose is low in early morning. He feels clammy when it happens, he checks sugar and has a snack, it is not happening during the day or while driving. Forms filled out for DMV. Glucose fasting:past 2 weeks average of 156's  . Currently on Tresiba 36 units,  Metformin for insulin resistance, and pre meal insulin .  He still checks fsbs multiple times daily, did not like the [employer] - not as accurate. No polyphagia, polyuria or polydipsia. ED is okay with Viagra ( instead of Cialis ), changed because of formulary change. His hgbA1C has been going up for the past 18 months  same time that he started to go down on Testosterone supplementation - because of coverage problems with his insurance, but is down to 7.1% . He is compliant with his medication, logging his glucose daily and exercise. He was diagnosed with DM as an young adult ( mid-20's ) and has good knowledge of his condition. He is still talking dietician from [facility] and is compliant with diet and medical recommendations. No hypoglycemia while driving or at work.   BPH: used to see Urologist - Dr. Lonna Cobb - years ago, had normal biopsies in the past. He was taking Cialis, however insurance no longer covers prescription, so he is now on generic Claudine Mouton has nocturia once per night, no dribbling, denies weak stream, last PSA was at goal   Hypogonadism: he is no longer on testosterone supplementation because insurance denied coverage, energy level is normal, he noticed glucose went up when he stopped supplementation, but he is doing better now.   Hyperlipidemia: at goal, taking Atorvastatin daily, no cramping or chest  pain. Reviewed labs last LDL 80, explained it can be better.    Patient Active Problem List   Diagnosis Date Noted  . Erectile dysfunction 06/26/2015  . BPH (benign prostatic hyperplasia) 03/20/2015  . 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 Dyslipidemia 03/07/2015  . Hypogonadism male 03/07/2015  . Obesity (BMI 30.0-34.9) 03/07/2015  . Allergic rhinitis, seasonal 03/07/2015  . Vitamin D deficiency 03/07/2015    Past Surgical History:  Procedure Laterality Date  . VASECTOMY      History reviewed. No pertinent family history.  Social History   Socioeconomic History  . Marital status: Married    Spouse name: Zella Ball  . Number of children: 5  . Years of education: Not on file  . Highest education level: High school graduate  Occupational History  . Not on file  Social Needs  . Financial resource strain: Not hard at all  . Food insecurity:    Worry: Never true    Inability: Never true  . Transportation needs:    Medical: No    Non-medical: No  Tobacco Use  . Smoking status: Never Smoker  . Smokeless tobacco: Never Used  Substance and Sexual Activity  . Alcohol use: Yes    Comment: 1-2 beers on weekends  . Drug use: No  . Sexual activity: Yes    Partners: Female    Birth control/protection: None  Lifestyle  . Physical  activity:    Days per week: 7 days    Minutes per session: 60 min  . Stress: Not at all  Relationships  . Social connections:    Talks on phone: More than three times a week    Gets together: More than three times a week    Attends religious service: More than 4 times per year    Active member of club or organization: No    Attends meetings of clubs or organizations: Never    Relationship status: Married  . Intimate partner violence:    Fear of current or ex partner: No    Emotionally abused: No    Physically abused: No    Forced sexual activity: No  Other Topics Concern  . Not on file   Social History Narrative  . Not on file     Current Outpatient Medications:  .  aspirin 81 MG chewable tablet, Chew 1 tablet by mouth daily., Disp: , Rfl:  .  atorvastatin (LIPITOR) 20 MG tablet, TAKE 1 TABLET (20 MG TOTAL) BY MOUTH DAILY., Disp: 90 tablet, Rfl: 2 .  B-D ULTRAFINE III SHORT PEN 31G X 8 MM MISC, USE AS DIRECTED, Disp: 100 each, Rfl: 5 .  Cholecalciferol (VITAMIN D) 2000 UNITS CAPS, Take 1 capsule by mouth daily., Disp: , Rfl:  .  glucose blood (FREESTYLE LITE) test strip, USE AS INSTRUCTED, Disp: 200 each, Rfl: 12 .  HUMALOG KWIKPEN 100 UNIT/ML KiwkPen, INJECT 5 TO 16 UNITS INTO THE SKIN 4 TIMES DAILY. (Patient taking differently: INJECT 5 TO 16 UNITS INTO THE SKIN 2 TIMES DAILY.), Disp: 45 mL, Rfl: 2 .  Lancets (FREESTYLE) lancets, Use as instructed, Disp: 100 each, Rfl: 12 .  metFORMIN (GLUCOPHAGE-XR) 500 MG 24 hr tablet, Take 1 tablet (500 mg total) by mouth daily with breakfast., Disp: 90 tablet, Rfl: 0 .  sildenafil (REVATIO) 20 MG tablet, Take 3 tablets (60 mg total) by mouth daily as needed., Disp: 50 tablet, Rfl: 2 .  TRESIBA FLEXTOUCH 200 UNIT/ML SOPN, INJECT 34 UNITS INTO THE SKIN DAILY (Patient taking differently: Inject 36 Units into the skin daily. ), Disp: 9 mL, Rfl: 2  No Known Allergies   ROS  Constitutional: Negative for fever or weight change.  Respiratory: Negative for cough and shortness of breath.   Cardiovascular: Negative for chest pain or palpitations.  Gastrointestinal: Negative for abdominal pain, no bowel changes.  Musculoskeletal: Negative for gait problem or joint swelling.  Skin: Negative for rash.  Neurological: Negative for dizziness or headache.  No other specific complaints in a complete review of systems (except as listed in HPI above).  Objective  Vitals:   05/02/18 1513  BP: 102/60  Pulse: 69  Resp: 14  Temp: 97.9 F (36.6 C)  TempSrc: Oral  SpO2: 97%  Weight: 230 lb 11.2 oz (104.6 kg)  Height: 6\' 2"  (1.88 m)    Body  mass index is 29.62 kg/m.  Physical Exam  Constitutional: Patient appears well-developed and well-nourished. Overweight.  No distress.  HEENT: head atraumatic, normocephalic, pupils equal and reactive to light,  neck supple, throat within normal limits Cardiovascular: Normal rate, regular rhythm and normal heart sounds.  No murmur heard. No BLE edema. Pulmonary/Chest: Effort normal and breath sounds normal. No respiratory distress. Abdominal: Soft.  There is no tenderness. Psychiatric: Patient has a normal mood and affect. behavior is normal. Judgment and thought content normal.  Recent Results (from the past 2160 hour(s))  Hemoglobin A1c  Status: Abnormal   Collection Time: 02/03/18  9:47 AM  Result Value Ref Range   Hgb A1c MFr Bld 7.2 (H) 4.8 - 5.6 %    Comment: (NOTE) Pre diabetes:          5.7%-6.4% Diabetes:              >6.4% Glycemic control for   <7.0% adults with diabetes    Mean Plasma Glucose 159.94 mg/dL    Comment: Performed at [facility], [address], [place], [place] [address]  POCT glycosylated hemoglobin (Hb A1C)     Status: Abnormal   Collection Time: 05/02/18  3:56 PM  Result Value Ref Range   Hemoglobin A1C 7.1 (A) 4.0 - 5.6 %   HbA1c POC (<> result, manual entry)  4.0 - 5.6 %   HbA1c, POC (prediabetic range)  5.7 - 6.4 %   HbA1c, POC (controlled diabetic range)  0.0 - 7.0 %      PHQ2/9: Depression screen [facility] 2/9 05/02/2018 01/25/2018 10/26/2017 07/27/2017 06/24/2017  Decreased Interest 0 0 0 0 0  Down, Depressed, Hopeless 0 0 0 0 0  PHQ - 2 Score 0 0 0 0 0  Altered sleeping 0 - - - -  Tired, decreased energy 0 - - - -  Change in appetite 0 - - - -  Feeling bad or failure about yourself  0 - - - -  Trouble concentrating 0 - - - -  Moving slowly or fidgety/restless 0 - - - -  Suicidal thoughts 0 - - - -  PHQ-9 Score 0 - - - -  Difficult doing work/chores Not difficult at all - - - -    Fall Risk: Fall Risk  05/02/2018 01/25/2018 10/26/2017  07/27/2017 06/24/2017  Falls in the past year? No No No No No  Number falls in past yr: - - - - -  Comment - - - - -  Injury with Fall? - - - - -  Comment - - - - -  Follow up - - - - -     Functional Status Survey: Is the patient deaf or have difficulty hearing?: No Does the patient have difficulty seeing, even when wearing glasses/contacts?: No Does the patient have difficulty concentrating, remembering, or making decisions?: No Does the patient have difficulty walking or climbing stairs?: No Does the patient have difficulty dressing or bathing?: No Does the patient have difficulty doing errands alone such as visiting a doctor's office or shopping?: No    Assessment & Plan  1. 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)  - POCT glycosylated hemoglobin (Hb A1C) 7.1% , advised to not give self insulin prior to bedtime if glucose is around 200, because it seems to be causing morning hypoglycemia.   2. Insulin resistance  - metFORMIN (GLUCOPHAGE-XR) 500 MG 24 hr tablet; Take 1 tablet (500 mg total) by mouth daily with breakfast.  Dispense: 90 tablet; Refill: 0  3. Dyslipidemia  Taking atorvastatin and no side effects  4. Hypogonadism male   5. Vitamin D deficiency  Continue vitamin D daily   6. Diabetic erectile dysfunction associated with type 1 diabetes mellitus (HCC)  Taking generic viagra and is doing well.

## 2018-05-03 ENCOUNTER — Ambulatory Visit: Payer: 59 | Admitting: Family Medicine

## 2018-05-22 ENCOUNTER — Other Ambulatory Visit: Payer: Self-pay | Admitting: Family Medicine

## 2018-06-13 ENCOUNTER — Other Ambulatory Visit: Payer: Self-pay | Admitting: Family Medicine

## 2018-06-13 DIAGNOSIS — N521 Erectile dysfunction due to diseases classified elsewhere: Principal | ICD-10-CM

## 2018-06-13 DIAGNOSIS — E1049 Type 1 diabetes mellitus with other diabetic neurological complication: Secondary | ICD-10-CM

## 2018-06-13 NOTE — Telephone Encounter (Signed)
Request for diabetes medication. Ivan Booker Buryresiba and Sidenafil   Last office visit pertaining to diabetes: 05/02/2018   Lab Results  Component Value Date   HGBA1C 7.1 (A) 05/02/2018    Follow up on 08/02/2018

## 2018-07-26 ENCOUNTER — Other Ambulatory Visit: Payer: Self-pay | Admitting: Family Medicine

## 2018-07-26 NOTE — Telephone Encounter (Signed)
Refill Request for Cholesterol medication. Atorvastatin to [facility]  Last physical: 05/02/2018   Lab Results  Component Value Date   CHOL 147 01/26/2018   HDL 53 01/26/2018   LDLCALC 80 01/26/2018   TRIG 49 01/26/2018   CHOLHDL 2.8 01/26/2018   Follow up on 08/02/2018

## 2018-08-02 ENCOUNTER — Ambulatory Visit: Payer: 59 | Admitting: Family Medicine

## 2018-08-03 ENCOUNTER — Other Ambulatory Visit: Payer: Self-pay | Admitting: Family Medicine

## 2018-08-03 DIAGNOSIS — E8881 Metabolic syndrome: Secondary | ICD-10-CM

## 2018-08-21 ENCOUNTER — Ambulatory Visit: Payer: 59 | Admitting: Family Medicine

## 2018-08-21 ENCOUNTER — Encounter: Payer: Self-pay | Admitting: Family Medicine

## 2018-08-21 VITALS — BP 132/76 | HR 73 | Temp 98.8°F | Resp 16 | Ht 74.0 in | Wt 232.7 lb

## 2018-08-21 DIAGNOSIS — E1049 Type 1 diabetes mellitus with other diabetic neurological complication: Secondary | ICD-10-CM

## 2018-08-21 DIAGNOSIS — E785 Hyperlipidemia, unspecified: Secondary | ICD-10-CM | POA: Diagnosis not present

## 2018-08-21 DIAGNOSIS — N521 Erectile dysfunction due to diseases classified elsewhere: Secondary | ICD-10-CM

## 2018-08-21 DIAGNOSIS — E291 Testicular hypofunction: Secondary | ICD-10-CM | POA: Diagnosis not present

## 2018-08-21 DIAGNOSIS — E1069 Type 1 diabetes mellitus with other specified complication: Secondary | ICD-10-CM

## 2018-08-21 DIAGNOSIS — N401 Enlarged prostate with lower urinary tract symptoms: Secondary | ICD-10-CM

## 2018-08-21 DIAGNOSIS — R350 Frequency of micturition: Secondary | ICD-10-CM

## 2018-08-21 LAB — POCT GLYCOSYLATED HEMOGLOBIN (HGB A1C): HBA1C, POC (CONTROLLED DIABETIC RANGE): 6.9 % (ref 0.0–7.0)

## 2018-08-21 NOTE — Progress Notes (Signed)
Name: Ivan Booker   MRN: [id number]    DOB: 1966-07-09   Date:08/21/2018       Progress Note  Subjective  Chief Complaint  Chief Complaint  Patient presents with  . Diabetes    Checks four times daily, Average-145  . Medication Refill  . Hyperlipidemia  . Benign Prostatic Hypertrophy  . Hypogonadism    HPI  DMI with ED: he is doing well .Reviewed his meter, only time that glucose was low was in am, down to 45 he ate a sugar cookie and improved. No episodes while driving, no episodes that required assistance from another person, usually happens when he takes too much insulin at night.  Forms filled out for [facility] again.. Glucose average past 3 months is 145. . Currently on Tresiba 36 units,  Metformin for insulin resistance, and pre meal insulin 2-4 units .  He still checks fsbs multiple times daily. No polyphagia, polyuria or polydipsia. ED is okay with Viagra ( instead of Cialis ), changed because of formulary change. His hgbA1C is at goal today.  He was diagnosed with DM as an young adult ( mid-20's ) and has good knowledge of his condition. He is still talking dietician from [facility] and is compliant with diet and medical recommendations. No hypoglycemia while driving or at work.   BPH: used to see Urologist - Dr. Lonna Cobb - years ago, had normal biopsies in the past. He was taking Cialis, however insurance no longer covers prescription, so he is now on generic Claudine Mouton has nocturia once per night, no dribbling, denies weak stream, last PSA was at goal . Unchanged   Hypogonadism: he is no longer on testosterone supplementation because insurance denied coverage, energy level is normal, he noticed glucose went up when he stopped supplementation, but is finally getting back to normal   Hyperlipidemia: at goal, taking Atorvastatin daily, no cramping or chest pain. Stable   Patient Active Problem List   Diagnosis Date Noted  . Erectile dysfunction 06/26/2015  . BPH (benign prostatic  hyperplasia) 03/20/2015  . 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 Dyslipidemia 03/07/2015  . Hypogonadism male 03/07/2015  . Obesity (BMI 30.0-34.9) 03/07/2015  . Allergic rhinitis, seasonal 03/07/2015  . Vitamin D deficiency 03/07/2015    Past Surgical History:  Procedure Laterality Date  . VASECTOMY      No family history on file.  Social History   Socioeconomic History  . Marital status: Married    Spouse name: Zella Ball  . Number of children: 5  . Years of education: Not on file  . Highest education level: High school graduate  Occupational History  . Not on file  Social Needs  . Financial resource strain: Not hard at all  . Food insecurity:    Worry: Never true    Inability: Never true  . Transportation needs:    Medical: No    Non-medical: No  Tobacco Use  . Smoking status: Never Smoker  . Smokeless tobacco: Never Used  Substance and Sexual Activity  . Alcohol use: Yes    Comment: 1-2 beers on weekends  . Drug use: No  . Sexual activity: Yes    Partners: Female    Birth control/protection: None  Lifestyle  . Physical activity:    Days per week: 7 days    Minutes per session: 60 min  . Stress: Not at all  Relationships  . Social connections:    Talks on phone: More than  three times a week    Gets together: More than three times a week    Attends religious service: More than 4 times per year    Active member of club or organization: No    Attends meetings of clubs or organizations: Never    Relationship status: Married  . Intimate partner violence:    Fear of current or ex partner: No    Emotionally abused: No    Physically abused: No    Forced sexual activity: No  Other Topics Concern  . Not on file  Social History Narrative  . Not on file     Current Outpatient Medications:  .  aspirin 81 MG chewable tablet, Chew 1 tablet by mouth daily., Disp: , Rfl:  .  atorvastatin (LIPITOR) 20 MG tablet,  TAKE 1 TABLET (20 MG TOTAL) BY MOUTH DAILY., Disp: 90 tablet, Rfl: 2 .  Cholecalciferol (VITAMIN D) 2000 UNITS CAPS, Take 1 capsule by mouth daily., Disp: , Rfl:  .  glucose blood (FREESTYLE LITE) test strip, USE AS INSTRUCTED, Disp: 200 each, Rfl: 12 .  HUMALOG KWIKPEN 100 UNIT/ML KiwkPen, INJECT 5 TO 16 UNITS INTO THE SKIN 4 TIMES DAILY. (Patient taking differently: INJECT 5 TO 16 UNITS INTO THE SKIN 2 TIMES DAILY.), Disp: 45 mL, Rfl: 2 .  Insulin Degludec (TRESIBA FLEXTOUCH) 200 UNIT/ML SOPN, Inject 36 Units into the skin daily., Disp: 9 mL, Rfl: 2 .  Lancets (FREESTYLE) lancets, Use as instructed, Disp: 100 each, Rfl: 12 .  sildenafil (REVATIO) 20 MG tablet, TAKE 3 TABLETS (60 MG TOTAL) BY MOUTH DAILY AS NEEDED., Disp: 50 tablet, Rfl: 2 .  UNIFINE PENTIPS 31G X 8 MM MISC, USE AS DIRECTED, Disp: 100 each, Rfl: 5 .  metFORMIN (GLUCOPHAGE-XR) 500 MG 24 hr tablet, TAKE 1 TABLET BY MOUTH DAILY WITH BREAKFAST, Disp: 90 tablet, Rfl: 0  No Known Allergies  I personally reviewed active problem list, medication list, allergies, family history, social history with the patient/caregiver today.   ROS  Constitutional: Negative for fever or weight change.  Respiratory: Negative for cough and shortness of breath.   Cardiovascular: Negative for chest pain or palpitations.  Gastrointestinal: Negative for abdominal pain, no bowel changes.  Musculoskeletal: Negative for gait problem or joint swelling.  Skin: Negative for rash.  Neurological: Negative for dizziness or headache.  No other specific complaints in a complete review of systems (except as listed in HPI above).  Objective  Vitals:   08/21/18 1025  BP: 132/76  Pulse: 73  Resp: 16  Temp: 98.8 F (37.1 C)  TempSrc: Oral  SpO2: 99%  Weight: 232 lb 11.2 oz (105.6 kg)  Height: 6\' 2"  (1.88 m)    Body mass index is 29.88 kg/m.  Physical Exam  Constitutional: Patient appears well-developed and well-nourished. No distress.  HEENT: head  atraumatic, normocephalic, pupils equal and reactive to light,  neck supple, throat within normal limits Cardiovascular: Normal rate, regular rhythm and normal heart sounds.  No murmur heard. No BLE edema. Pulmonary/Chest: Effort normal and breath sounds normal. No respiratory distress. Abdominal: Soft.  There is no tenderness. Psychiatric: Patient has a normal mood and affect. behavior is normal. Judgment and thought content normal.  Recent Results (from the past 2160 hour(s))  POCT HgB A1C     Status: Normal   Collection Time: 08/21/18 10:30 AM  Result Value Ref Range   Hemoglobin A1C     HbA1c POC (<> result, manual entry)     HbA1c, POC (prediabetic  range)     HbA1c, POC (controlled diabetic range) 6.9 0.0 - 7.0 %    Diabetic Foot Exam: Diabetic Foot Exam - Simple   Simple Foot Form Diabetic Foot exam was performed with the following findings:  Yes 08/21/2018 11:12 AM  Visual Inspection No deformities, no ulcerations, no other skin breakdown bilaterally:  Yes Sensation Testing Intact to touch and monofilament testing bilaterally:  Yes Pulse Check Posterior Tibialis and Dorsalis pulse intact bilaterally:  Yes Comments     PH[facility]2/9: [facility] 08/21/2018 05/02/2018 01/25/2018 10/26/2017 07/27/2017  Decreased Interest 0 0 0 0 0  Down, Depressed, Hopeless 0 0 0 0 0  PHQ - 2 Score 0 0 0 0 0  Altered sleeping - 0 - - -  Tired, decreased energy - 0 - - -  Change in appetite - 0 - - -  Feeling bad or failure about yourself  - 0 - - -  Trouble concentrating - 0 - - -  Moving slowly or fidgety/restless - 0 - - -  Suicidal thoughts - 0 - - -  PHQ-9 Score - 0 - - -  Difficult doing work/chores - Not difficult at all - - -     Fall Risk: Fall Risk  08/21/2018 05/02/2018 01/25/2018 10/26/2017 07/27/2017  Falls in the past year? 0 No No No No  Number falls in past yr: 0 - - - -  Comment - - - - -  Injury with Fall? - - - - -  Comment - - - - -  Follow up - - - - -      Functional Status Survey: Is the patient deaf or have difficulty hearing?: No Does the patient have difficulty seeing, even when wearing glasses/contacts?: No Does the patient have difficulty concentrating, remembering, or making decisions?: No Does the patient have difficulty walking or climbing stairs?: No Does the patient have difficulty dressing or bathing?: No Does the patient have difficulty doing errands alone such as visiting a doctor's office or shopping?: No   Assessment & Plan  1. 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)  - POCT HgB A1C Forms filled out for DMV  2. Dyslipidemia  Continue medication   3. Hypogonadism male  Continue supplementation   4. Diabetic erectile dysfunction associated with type 1 diabetes mellitus (HCC)   5. Benign prostatic hyperplasia with urinary frequency  Doing well

## 2018-08-31 DIAGNOSIS — E119 Type 2 diabetes mellitus without complications: Secondary | ICD-10-CM | POA: Diagnosis not present

## 2018-08-31 LAB — HM DIABETES EYE EXAM

## 2018-11-10 ENCOUNTER — Other Ambulatory Visit: Payer: Self-pay | Admitting: Family Medicine

## 2018-11-10 DIAGNOSIS — E8881 Metabolic syndrome: Secondary | ICD-10-CM

## 2018-11-10 NOTE — Telephone Encounter (Signed)
Refill request for diabetic medication:    Metformin 500 mg  Last office visit pertaining to diabetes: 08/21/2018  Lab Results  Component Value Date   HGBA1C 6.9 08/21/2018   Follow-ups on file. 11/22/2018

## 2018-11-15 ENCOUNTER — Other Ambulatory Visit: Payer: Self-pay | Admitting: Family Medicine

## 2018-11-15 DIAGNOSIS — E1049 Type 1 diabetes mellitus with other diabetic neurological complication: Secondary | ICD-10-CM

## 2018-11-15 DIAGNOSIS — N521 Erectile dysfunction due to diseases classified elsewhere: Principal | ICD-10-CM

## 2018-11-22 ENCOUNTER — Encounter: Payer: Self-pay | Admitting: Family Medicine

## 2018-11-22 ENCOUNTER — Ambulatory Visit (INDEPENDENT_AMBULATORY_CARE_PROVIDER_SITE_OTHER): Payer: 59 | Admitting: Family Medicine

## 2018-11-22 VITALS — BP 100/60 | HR 67 | Temp 98.0°F | Resp 16 | Ht 74.0 in | Wt 236.4 lb

## 2018-11-22 DIAGNOSIS — E291 Testicular hypofunction: Secondary | ICD-10-CM

## 2018-11-22 DIAGNOSIS — N521 Erectile dysfunction due to diseases classified elsewhere: Secondary | ICD-10-CM

## 2018-11-22 DIAGNOSIS — E1049 Type 1 diabetes mellitus with other diabetic neurological complication: Secondary | ICD-10-CM | POA: Diagnosis not present

## 2018-11-22 DIAGNOSIS — R350 Frequency of micturition: Secondary | ICD-10-CM | POA: Diagnosis not present

## 2018-11-22 DIAGNOSIS — E785 Hyperlipidemia, unspecified: Secondary | ICD-10-CM | POA: Diagnosis not present

## 2018-11-22 DIAGNOSIS — N401 Enlarged prostate with lower urinary tract symptoms: Secondary | ICD-10-CM | POA: Diagnosis not present

## 2018-11-22 DIAGNOSIS — E1069 Type 1 diabetes mellitus with other specified complication: Secondary | ICD-10-CM

## 2018-11-22 MED ORDER — INSULIN PEN NEEDLE 31G X 8 MM MISC: 1.0000 | 5 refills | Status: DC

## 2018-11-22 NOTE — Progress Notes (Signed)
Name: Ivan Booker   MRN: [id number]    DOB: 03-05-1966   Date:11/22/2018       Progress Note  Subjective  Chief Complaint  Chief Complaint  Patient presents with  . Diabetes    HPI  DMI with ED: he is doing well . He states glucose still drops occasionally when physically active and over shoots the insulin. Average past 7 days was 136, past 30 days 142 No episodes of hypoglycemia  while driving, no episodes that required assistance from another person, usually happens when he takes too much insulin before dinner and usually happens in the early morning , he has symptoms and gets up . No polyphagia, polyuria or polydipsia. ED is okay with Viagra ( instead of Cialis ), changed because of formulary change. His hgbA1C is at goal today.  He was diagnosed with DM as an young adult ( mid-20's ) and has good knowledge of his condition. He is still talking dietician from [facility] and is compliant with diet and medical recommendations.  BPH: used to see Urologist - Dr. Lonna Cobb - years ago, had normal biopsies in the past. He were taking Cialis but no longer covered by insurance so he is on generic Viagra and it works well but prefers the daily medication, discussed Good Rx and he will let me know if he would like to switch back to Cilais 5 mg daily He has nocturia once per night, no dribbling, denies weak stream, last PSA was at goal.   Hypogonadism: heis no longer on testosterone supplementation because insurance denied coverage, energy level is normal, he noticed glucose went up when he stopped supplementation but is back to baseline now   Hyperlipidemia: at goal, taking Atorvastatin daily, no cramping or chest pain.REcheck next visit   Patient Active Problem List   Diagnosis Date Noted  . Erectile dysfunction 06/26/2015  . BPH (benign prostatic hyperplasia) 03/20/2015  . 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 Dyslipidemia 03/07/2015  .  Hypogonadism male 03/07/2015  . Obesity (BMI 30.0-34.9) 03/07/2015  . Allergic rhinitis, seasonal 03/07/2015  . Vitamin D deficiency 03/07/2015    Past Surgical History:  Procedure Laterality Date  . VASECTOMY      History reviewed. No pertinent family history.  Social History   Socioeconomic History  . Marital status: Married    Spouse name: Zella Ball  . Number of children: 5  . Years of education: Not on file  . Highest education level: High school graduate  Occupational History  . Not on file  Social Needs  . Financial resource strain: Not hard at all  . Food insecurity:    Worry: Never true    Inability: Never true  . Transportation needs:    Medical: No    Non-medical: No  Tobacco Use  . Smoking status: Never Smoker  . Smokeless tobacco: Never Used  Substance and Sexual Activity  . Alcohol use: Yes    Comment: 1-2 beers on weekends  . Drug use: No  . Sexual activity: Yes    Partners: Female    Birth control/protection: None  Lifestyle  . Physical activity:    Days per week: 7 days    Minutes per session: 60 min  . Stress: Not at all  Relationships  . Social connections:    Talks on phone: More than three times a week    Gets together: More than three times a week    Attends religious service: More  than 4 times per year    Active member of club or organization: No    Attends meetings of clubs or organizations: Never    Relationship status: Married  . Intimate partner violence:    Fear of current or ex partner: No    Emotionally abused: No    Physically abused: No    Forced sexual activity: No  Other Topics Concern  . Not on file  Social History Narrative  . Not on file     Current Outpatient Medications:  .  aspirin 81 MG chewable tablet, Chew 1 tablet by mouth daily., Disp: , Rfl:  .  atorvastatin (LIPITOR) 20 MG tablet, TAKE 1 TABLET (20 MG TOTAL) BY MOUTH DAILY., Disp: 90 tablet, Rfl: 2 .  Cholecalciferol (VITAMIN D) 2000 UNITS CAPS, Take 1  capsule by mouth daily., Disp: , Rfl:  .  glucose blood (FREESTYLE LITE) test strip, USE AS INSTRUCTED, Disp: 200 each, Rfl: 12 .  HUMALOG KWIKPEN 100 UNIT/ML KiwkPen, INJECT 5 TO 16 UNITS INTO THE SKIN 4 TIMES DAILY. (Patient taking differently: INJECT 5 TO 16 UNITS INTO THE SKIN 2 TIMES DAILY.), Disp: 45 mL, Rfl: 2 .  Insulin Pen Needle (UNIFINE PENTIPS) 31G X 8 MM MISC, 1 each by Other route See admin instructions., Disp: 100 each, Rfl: 5 .  Lancets (FREESTYLE) lancets, Use as instructed, Disp: 100 each, Rfl: 12 .  metFORMIN (GLUCOPHAGE-XR) 500 MG 24 hr tablet, TAKE 1 TABLET BY MOUTH DAILY WITH BREAKFAST, Disp: 90 tablet, Rfl: 1 .  sildenafil (REVATIO) 20 MG tablet, TAKE 3 TABLETS (60 MG TOTAL) BY MOUTH DAILY AS NEEDED., Disp: 50 tablet, Rfl: 2 .  TRESIBA FLEXTOUCH 200 UNIT/ML SOPN, INJECT 36 UNITS INTO THE SKIN DAILY., Disp: 9 mL, Rfl: 2  No Known Allergies  I personally reviewed active problem list, medication list, allergies, family history, social history with the patient/caregiver today.   ROS  Constitutional: Negative for fever or weight change.  Respiratory: Negative for cough and shortness of breath.   Cardiovascular: Negative for chest pain or palpitations.  Gastrointestinal: Negative for abdominal pain, no bowel changes.  Musculoskeletal: Negative for gait problem or joint swelling.  Skin: Negative for rash.  Neurological: Negative for dizziness or headache.  No other specific complaints in a complete review of systems (except as listed in HPI above).   Objective  Vitals:   11/22/18 1330  BP: 100/60  Pulse: 67  Resp: 16  Temp: 98 F (36.7 C)  TempSrc: Oral  SpO2: 98%  Weight: 236 lb 6.4 oz (107.2 kg)  Height:  (1.88 m)    Body mass index is 30.35 kg/m.  Physical Exam  Constitutional: Patient appears well-developed and well-nourished. Obese  No distress.  HEENT: head atraumatic, normocephalic, pupils equal and reactive to light,  neck supple, throat  within normal limits Cardiovascular: Normal rate, regular rhythm and normal heart sounds.  No murmur heard. No BLE edema. Pulmonary/Chest: Effort normal and breath sounds normal. No respiratory distress. Abdominal: Soft.  There is no tenderness. Psychiatric: Patient has a normal mood and affect. behavior is normal. Judgment and thought content normal.  Recent Results (from the past 2160 hour(s))  HM DIABETES EYE EXAM     Status: None   Collection Time: 08/31/18 12:00 AM  Result Value Ref Range   HM Diabetic Eye Exam No Retinopathy No Retinopathy    Comment: performed at [facility] by Dr. Lockie Mola     PHQ2/9: Depression screen [facility] 2/9 11/22/2018  08/21/2018 05/02/2018 01/25/2018 10/26/2017  Decreased Interest 0 0 0 0 0  Down, Depressed, Hopeless 0 0 0 0 0  PHQ - 2 Score 0 0 0 0 0  Altered sleeping 0 - 0 - -  Tired, decreased energy 2 - 0 - -  Change in appetite 0 - 0 - -  Feeling bad or failure about yourself  0 - 0 - -  Trouble concentrating 0 - 0 - -  Moving slowly or fidgety/restless 0 - 0 - -  Suicidal thoughts 0 - 0 - -  PHQ-9 Score 2 - 0 - -  Difficult doing work/chores Not difficult at all - Not difficult at all - -     Fall Risk: Fall Risk  11/22/2018 08/21/2018 05/02/2018 01/25/2018 10/26/2017  Falls in the past year? 0 0 No No No  Number falls in past yr: 0 0 - - -  Comment - - - - -  Injury with Fall? 0 - - - -  Comment - - - - -  Follow up - - - - -      Assessment & Plan  1. 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  Dispense: 100 each; Refill: 5  2. Dyslipidemia  On statin   3. Hypogonadism male   4. Diabetic erectile dysfunction associated with type 1 diabetes mellitus (HCC)   5. Benign prostatic hyperplasia with urinary frequency  Recheck PSA next visit

## 2018-11-23 LAB — HEMOGLOBIN A1C
EAG (MMOL/L): 8.9 (calc)
Hgb A1c MFr Bld: 7.2 % of total Hgb — ABNORMAL HIGH (ref <5.7)
Mean Plasma Glucose: 160 (calc)

## 2018-12-04 ENCOUNTER — Other Ambulatory Visit: Payer: Self-pay | Admitting: Family Medicine

## 2018-12-12 ENCOUNTER — Other Ambulatory Visit: Payer: Self-pay | Admitting: Family Medicine

## 2018-12-12 NOTE — Telephone Encounter (Signed)
Refill request for general medication: Revatio 20 mg  Last office visit: 11/22/2018  Follow-ups on file. 02/12/2019

## 2019-01-04 ENCOUNTER — Other Ambulatory Visit: Payer: Self-pay | Admitting: Family Medicine

## 2019-01-04 DIAGNOSIS — N521 Erectile dysfunction due to diseases classified elsewhere: Principal | ICD-10-CM

## 2019-01-04 DIAGNOSIS — E1049 Type 1 diabetes mellitus with other diabetic neurological complication: Secondary | ICD-10-CM

## 2019-01-05 NOTE — Telephone Encounter (Signed)
Refill request for diabetic medication:   Humalog 100 unit kwikpen  Last office visit pertaining to diabetes: 11/22/2018   Lab Results  Component Value Date   HGBA1C 7.2 (H) 11/22/2018    Follow-ups on file. 02/12/2019

## 2019-01-05 NOTE — Telephone Encounter (Signed)
Pt would like to know if Dr. Carlynn Purl can send in refill today before the weekend. Please advise.

## 2019-01-16 MED FILL — ATORVASTATIN 20 MG TABLET: 20 | 90 days supply | Qty: 90 | Fill #0

## 2019-01-16 MED FILL — UNIFINE PENTIPS 8MM 31G: 31G X 8 MM | 30 days supply | Qty: 100 | Fill #0

## 2019-01-16 MED FILL — FREESTYLE LITE TEST STRIP: 40 days supply | Qty: 200 | Fill #0

## 2019-02-12 ENCOUNTER — Other Ambulatory Visit: Payer: Self-pay

## 2019-02-12 ENCOUNTER — Encounter: Payer: Self-pay | Admitting: Family Medicine

## 2019-02-12 ENCOUNTER — Ambulatory Visit (INDEPENDENT_AMBULATORY_CARE_PROVIDER_SITE_OTHER): Payer: 59 | Admitting: Family Medicine

## 2019-02-12 VITALS — BP 126/74 | HR 68 | Temp 98.2°F | Resp 16 | Ht 74.0 in | Wt 235.2 lb

## 2019-02-12 DIAGNOSIS — E559 Vitamin D deficiency, unspecified: Secondary | ICD-10-CM | POA: Diagnosis not present

## 2019-02-12 DIAGNOSIS — E139 Other specified diabetes mellitus without complications: Secondary | ICD-10-CM

## 2019-02-12 DIAGNOSIS — N521 Erectile dysfunction due to diseases classified elsewhere: Secondary | ICD-10-CM | POA: Diagnosis not present

## 2019-02-12 DIAGNOSIS — E785 Hyperlipidemia, unspecified: Secondary | ICD-10-CM | POA: Diagnosis not present

## 2019-02-12 DIAGNOSIS — Z0000 Encounter for general adult medical examination without abnormal findings: Secondary | ICD-10-CM | POA: Diagnosis not present

## 2019-02-12 DIAGNOSIS — Z125 Encounter for screening for malignant neoplasm of prostate: Secondary | ICD-10-CM

## 2019-02-12 DIAGNOSIS — E1049 Type 1 diabetes mellitus with other diabetic neurological complication: Secondary | ICD-10-CM

## 2019-02-12 DIAGNOSIS — E291 Testicular hypofunction: Secondary | ICD-10-CM | POA: Diagnosis not present

## 2019-02-12 DIAGNOSIS — Z79899 Other long term (current) drug therapy: Secondary | ICD-10-CM

## 2019-02-12 DIAGNOSIS — E1069 Type 1 diabetes mellitus with other specified complication: Secondary | ICD-10-CM

## 2019-02-12 LAB — POCT GLYCOSYLATED HEMOGLOBIN (HGB A1C): HbA1c, POC (controlled diabetic range): 6.9 % (ref 0.0–7.0)

## 2019-02-12 MED ORDER — GLUCOSE BLOOD VI STRP: ORAL_STRIP | 2 refills | Status: DC

## 2019-02-12 MED ORDER — TADALAFIL 5 MG PO TABS: 5.0000 mg | ORAL_TABLET | Freq: Every day | ORAL | 1 refills | Status: DC | PRN

## 2019-02-12 MED ORDER — ATORVASTATIN CALCIUM 20 MG PO TABS: 20.0000 mg | ORAL_TABLET | Freq: Every day | ORAL | 1 refills | Status: DC

## 2019-02-12 MED ORDER — INSULIN LISPRO (1 UNIT DIAL) 100 UNIT/ML (KWIKPEN): PEN_INJECTOR | SUBCUTANEOUS | 2 refills | Status: DC

## 2019-02-12 NOTE — Progress Notes (Signed)
Name: Ivan Booker   MRN: [id number]    DOB: Feb 24, 1966   Date:02/12/2019       Progress Note  Subjective  Chief Complaint  Chief Complaint  Patient presents with  . Medication Refill  . Annual Exam  . Hyperlipidemia  . Hypogonadism  . Diabetes  . Benign Prostatic Hypertrophy    HPI  Patient presents for annual CPE and follow up.  DMI with ED: he is doing well . He states glucose still drops occasionally when physically active and over shoots the insulin. He states glucose drops with activity only, he denies  episodes of hypoglycemia  while driving, no episodes that required assistance from another person. No polyphagia, polyuria or polydipsia. ED we will switch to Cialis now . His hgbA1C is at goal today at 6.9 % .He was diagnosed with DM as an young adult ( mid-20's ) and has good knowledge of his condition. He is still talking dietician from [facility] and is compliant with diet and medical recommendations. Discussed again about having a 24 hours glucose monitor to see when his sugar is trending down or up.   BPH: used to see Urologist - Dr. Lonna CobbStoioff - years ago, had normal biopsies in the past. He were taking Cialis but no longer covered by insurance so he is on generic Viagra and it works well but prefers the daily medication, discussed Good Rx and he will let me know if he would like to switch back to Cilais 5 mg daily since he can now get it from [employer]   Hypogonadism: heis no longer on testosterone supplementation because insurance denied coverage, energy level is normal. He does not want to recheck levels at this time  Hyperlipidemia: at goal, taking Atorvastatin daily, no cramping or chest pain.We will check levels today   USPSTF grade A and B recommendations:  Diet: he tends to eat healthy , cutting down on carbohydrates  Exercise: very active  Depression: phq 9 is negative   [facility] 2/9 02/12/2019 11/22/2018 08/21/2018 05/02/2018 01/25/2018   Decreased Interest 0 0 0 0 0  Down, Depressed, Hopeless 0 0 0 0 0  PHQ - 2 Score 0 0 0 0 0  Altered sleeping 0 0 - 0 -  Tired, decreased energy 0 2 - 0 -  Change in appetite 0 0 - 0 -  Feeling bad or failure about yourself  0 0 - 0 -  Trouble concentrating 0 0 - 0 -  Moving slowly or fidgety/restless 0 0 - 0 -  Suicidal thoughts 0 0 - 0 -  PHQ-9 Score 0 2 - 0 -  Difficult doing work/chores Not difficult at all Not difficult at all - Not difficult at all -    Hypertension:  BP Readings from Last 3 Encounters:  02/12/19 126/74  11/22/18 100/60  08/21/18 132/76    Obesity: Wt Readings from Last 3 Encounters:  02/12/19 235 lb 3.2 oz (106.7 kg)  11/22/18 236 lb 6.4 oz (107.2 kg)  08/21/18 232 lb 11.2 oz (105.6 kg)   BMI Readings from Last 3 Encounters:  02/12/19 30.20 kg/m  11/22/18 30.35 kg/m  08/21/18 29.88 kg/m     Lipids:  Lab Results  Component Value Date   CHOL 147 01/26/2018   CHOL 135 01/31/2017   CHOL 154 04/06/2016   Lab Results  Component Value Date   HDL 53 01/26/2018   HDL 50 01/31/2017   HDL 54 04/06/2016   Lab Results  Component  Value Date   LDLCALC 80 01/26/2018   LDLCALC 74 01/31/2017   LDLCALC 89 04/06/2016   Lab Results  Component Value Date   TRIG 49 01/26/2018   TRIG 54 01/31/2017   TRIG 55 04/06/2016   Lab Results  Component Value Date   CHOLHDL 2.8 01/26/2018   CHOLHDL 2.7 01/31/2017   CHOLHDL 2.9 04/06/2016   No results found for: LDLDIRECT Glucose:  Glucose, Bld  Date Value Ref Range Status  01/26/2018 95 65 - 99 mg/dL Final    Comment:    .            Fasting reference interval .   07/28/2017 140 (H) 65 - 99 mg/dL Final    Comment:    .            Fasting reference interval . For someone without known diabetes, a glucose value >125 mg/dL indicates that they may have diabetes and this should be confirmed with a follow-up test. .   01/31/2017 114 (H) 65 - 99 mg/dL Final   Glucose-Capillary  Date Value Ref  Range Status  03/02/2015 185 (H) 65 - 99 mg/dL Final      Office Visit from 02/12/2019 in [facility]  AUDIT-C Score  1      Married STD testing and prevention (HIV/chl/gon/syphilis): N/A Hep C: 2013   Skin cancer: discussed atypical lesion  Colorectal cancer: next one in 2025  Prostate cancer: repeat today , used to take testosterone supplementation but off now.  Lab Results  Component Value Date   PSA 0.5 01/26/2018   PSA 0.4 01/31/2017    IPSS Questionnaire (AUA-7): Over the past month.   1)  How often have you had a sensation of not emptying your bladder completely after you finish urinating?  0 - Not at all  2)  How often have you had to urinate again less than two hours after you finished urinating? 0 - Not at all  3)  How often have you found you stopped and started again several times when you urinated?  1 - Less than 1 time in 5  4) How difficult have you found it to postpone urination?  0 - Not at all  5) How often have you had a weak urinary stream?  0 - Not at all  6) How often have you had to push or strain to begin urination?  0 - Not at all  7) How many times did you most typically get up to urinate from the time you went to bed until the time you got up in the morning?  1 - 1 time  Total score:  0-7 mildly symptomatic   8-19 moderately symptomatic   20-35 severely symptomatic    Lung cancer:   Low Dose CT Chest recommended if Age 61-80 years, 30 pack-year currently smoking OR have quit w/in 15years. Patient does not qualify.   AAA:  The USPSTF recommends one-time screening with ultrasonography in men ages 67 to 75 years who have ever smoked ECG:  2016  Advanced Care Planning: A voluntary discussion about advance care planning including the explanation and discussion of advance directives.  Discussed health care proxy and Living will, and the patient was able to identify a health care proxy as wife.  Patient does not have a living will at  present time.  Patient Active Problem List   Diagnosis Date Noted  . Erectile dysfunction 06/26/2015  . BPH (benign prostatic hyperplasia) 03/20/2015  .  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 Dyslipidemia 03/07/2015  . Hypogonadism male 03/07/2015  . Obesity (BMI 30.0-34.9) 03/07/2015  . Allergic rhinitis, seasonal 03/07/2015  . Vitamin D deficiency 03/07/2015    Past Surgical History:  Procedure Laterality Date  . VASECTOMY  1993    Family History  Problem Relation Age of Onset  . COPD Father        Smoker     Social History   Socioeconomic History  . Marital status: Married    Spouse name: Zella Ball  . Number of children: 5  . Years of education: Not on file  . Highest education level: High school graduate  Occupational History  . Not on file  Social Needs  . Financial resource strain: Not hard at all  . Food insecurity:    Worry: Never true    Inability: Never true  . Transportation needs:    Medical: No    Non-medical: No  Tobacco Use  . Smoking status: Never Smoker  . Smokeless tobacco: Never Used  Substance and Sexual Activity  . Alcohol use: Yes    Comment: 1-2 beers on weekends  . Drug use: No  . Sexual activity: Yes    Partners: Female    Birth control/protection: None  Lifestyle  . Physical activity:    Days per week: 7 days    Minutes per session: 60 min  . Stress: Not at all  Relationships  . Social connections:    Talks on phone: More than three times a week    Gets together: More than three times a week    Attends religious service: More than 4 times per year    Active member of club or organization: No    Attends meetings of clubs or organizations: Never    Relationship status: Married  . Intimate partner violence:    Fear of current or ex partner: No    Emotionally abused: No    Physically abused: No    Forced sexual activity: No  Other Topics Concern  . Not on file  Social History  Narrative  . Not on file     Current Outpatient Medications:  .  aspirin 81 MG chewable tablet, Chew 1 tablet by mouth daily., Disp: , Rfl:  .  atorvastatin (LIPITOR) 20 MG tablet, Take 1 tablet (20 mg total) by mouth daily., Disp: 90 tablet, Rfl: 1 .  Cholecalciferol (VITAMIN D) 2000 UNITS CAPS, Take 1 capsule by mouth daily., Disp: , Rfl:  .  glucose blood test strip, Check fsbs 5 times daily, Disp: 300 each, Rfl: 2 .  insulin lispro (HUMALOG KWIKPEN) 100 UNIT/ML KwikPen, INJECT 5 TO 16 UNITS INTO THE SKIN with meals, Disp: 45 mL, Rfl: 2 .  Insulin Pen Needle (UNIFINE PENTIPS) 31G X 8 MM MISC, 1 each by Other route See admin instructions., Disp: 100 each, Rfl: 5 .  Lancets (FREESTYLE) lancets, Use as instructed, Disp: 100 each, Rfl: 12 .  metFORMIN (GLUCOPHAGE-XR) 500 MG 24 hr tablet, TAKE 1 TABLET BY MOUTH DAILY WITH BREAKFAST, Disp: 90 tablet, Rfl: 1 .  TRESIBA FLEXTOUCH 200 UNIT/ML SOPN, INJECT 36 UNITS INTO THE SKIN DAILY., Disp: 9 mL, Rfl: 2 .  tadalafil (CIALIS) 5 MG tablet, Take 1 tablet (5 mg total) by mouth daily as needed for erectile dysfunction., Disp: 90 tablet, Rfl: 1  No Known Allergies   ROS  Constitutional: Negative for fever or weight change.  Respiratory: Negative for cough  and shortness of breath.   Cardiovascular: Negative for chest pain or palpitations.  Gastrointestinal: Negative for abdominal pain, no bowel changes.  Musculoskeletal: Negative for gait problem or joint swelling.  Skin: Negative for rash.  Neurological: Negative for dizziness or headache.  No other specific complaints in a complete review of systems (except as listed in HPI above).  Objective  Vitals:   02/12/19 1450  BP: 126/74  Pulse: 68  Resp: 16  Temp: 98.2 F (36.8 C)  TempSrc: Oral  SpO2: 99%  Weight: 235 lb 3.2 oz (106.7 kg)  Height:  (1.88 m)    Body mass index is 30.2 kg/m.  Physical Exam  Constitutional: Patient appears well-developed and well-nourished. No  distress.  HENT: Head: Normocephalic and atraumatic. Ears: B TMs ok, no erythema or effusion; Nose: Nose normal. Mouth/Throat: Oropharynx is clear and moist. No oropharyngeal exudate.  Eyes: Conjunctivae and EOM are normal. Pupils are equal, round, and reactive to light. No scleral icterus.  Neck: Normal range of motion. Neck supple. No JVD present. No thyromegaly present.  Cardiovascular: Normal rate, regular rhythm and normal heart sounds.  No murmur heard. No BLE edema. Pulmonary/Chest: Effort normal and breath sounds normal. No respiratory distress. Abdominal: Soft. Bowel sounds are normal, no distension. There is no tenderness. no masses MALE GENITALIA: Normal descended testes bilaterally, no masses palpated, no hernias, no lesions, no discharge RECTAL: Prostate normal size is slightly enlarged to touch,  no rectal masses or hemorrhoids Musculoskeletal: Normal range of motion, no joint effusions. No gross deformities Neurological: he is alert and oriented to person, place, and time. No cranial nerve deficit. Coordination, balance, strength, speech and gait are normal.  Skin: Skin is warm and dry. No rash noted. No erythema.  Psychiatric: Patient has a normal mood and affect. behavior is normal. Judgment and thought content normal.  Recent Results (from the past 2160 hour(s))  Hemoglobin A1c     Status: Abnormal   Collection Time: 11/22/18 12:00 AM  Result Value Ref Range   Hgb A1c MFr Bld 7.2 (H) <5.7 % of total Hgb    Comment: For someone without known diabetes, a hemoglobin A1c value of 6.5% or greater indicates that they may have  diabetes and this should be confirmed with a follow-up  test. . For someone with known diabetes, a value <7% indicates  that their diabetes is well controlled and a value  greater than or equal to 7% indicates suboptimal  control. A1c targets should be individualized based on  duration of diabetes, age, comorbid conditions, and  other  considerations. . Currently, no consensus exists regarding use of hemoglobin A1c for diagnosis of diabetes for children. .    Mean Plasma Glucose 160 (calc)   eAG (mmol/L) 8.9 (calc)  POCT HgB A1C     Status: Normal   Collection Time: 02/12/19  3:00 PM  Result Value Ref Range   Hemoglobin A1C     HbA1c POC (<> result, manual entry)     HbA1c, POC (prediabetic range)     HbA1c, POC (controlled diabetic range) 6.9 0.0 - 7.0 %     PHQ2/9: Depression screen [facility] 2/9 02/12/2019 11/22/2018 08/21/2018 05/02/2018 01/25/2018  Decreased Interest 0 0 0 0 0  Down, Depressed, Hopeless 0 0 0 0 0  PHQ - 2 Score 0 0 0 0 0  Altered sleeping 0 0 - 0 -  Tired, decreased energy 0 2 - 0 -  Change in appetite 0 0 - 0 -  Feeling bad or failure about yourself  0 0 - 0 -  Trouble concentrating 0 0 - 0 -  Moving slowly or fidgety/restless 0 0 - 0 -  Suicidal thoughts 0 0 - 0 -  PHQ-9 Score 0 2 - 0 -  Difficult doing work/chores Not difficult at all Not difficult at all - Not difficult at all -     Fall Risk: Fall Risk  02/12/2019 11/22/2018 08/21/2018 05/02/2018 01/25/2018  Falls in the past year? 0 0 0 No No  Number falls in past yr: 0 0 0 - -  Comment - - - - -  Injury with Fall? 0 0 - - -  Comment - - - - -  Follow up - - - - -     Functional Status Survey: Is the patient deaf or have difficulty hearing?: No Does the patient have difficulty seeing, even when wearing glasses/contacts?: No Does the patient have difficulty concentrating, remembering, or making decisions?: No Does the patient have difficulty walking or climbing stairs?: No Does the patient have difficulty dressing or bathing?: No Does the patient have difficulty doing errands alone such as visiting a doctor's office or shopping?: No    Assessment & Plan  1. 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 ratio - insulin lispro (HUMALOG KWIKPEN) 100  UNIT/ML KwikPen; INJECT 5 TO 16 UNITS INTO THE SKIN with meals  Dispense: 45 mL; Refill: 2 - glucose blood test strip; Check fsbs 5 times daily  Dispense: 300 each; Refill: 2  2. Dyslipidemia  - Lipid panel - atorvastatin (LIPITOR) 20 MG tablet; Take 1 tablet (20 mg total) by mouth daily.  Dispense: 90 tablet; Refill: 1  3. Hypogonadism male   4. Diabetic erectile dysfunction associated with type 1 diabetes mellitus (HCC)  - tadalafil (CIALIS) 5 MG tablet; Take 1 tablet (5 mg total) by mouth daily as needed for erectile dysfunction.  Dispense: 90 tablet; Refill: 1 - glucose blood test strip; Check fsbs 5 times daily  Dispense: 300 each; Refill: 2  5. Vitamin D deficiency  - VITAMIN D 25 Hydroxy (Vit-D Deficiency, Fractures)  6. Well adult exam  - Lipid panel - COMPLETE METABOLIC PANEL WITH GFR - CBC with Differential/Platelet - PSA - VITAMIN D 25 Hydroxy (Vit-D Deficiency, Fractures) - B12 and Folate Panel  7. Long-term use of high-risk medication  - COMPLETE METABOLIC PANEL WITH GFR - CBC with Differential/Platelet - B12 and Folate Panel  8. Prostate cancer screening  - PSA    -Prostate cancer screening and PSA options (with potential risks and benefits of testing vs not testing) were discussed along with recent recs/guidelines. -USPSTF grade A and B recommendations reviewed with patient; age-appropriate recommendations, preventive care, screening tests, etc discussed and encouraged; healthy living encouraged; see AVS for patient education given to patient -Discussed importance of 150 minutes of physical activity weekly, eat two servings of fish weekly, eat one serving of tree nuts ( cashews, pistachios, pecans, almonds.Marland Kitchen) every other day, eat 6 servings of fruit/vegetables daily and drink plenty of water and avoid sweet beverages.

## 2019-02-12 NOTE — Patient Instructions (Signed)
Preventive Care 40-64 Years, Male Preventive care refers to lifestyle choices and visits with your health care provider that can promote health and wellness. What does preventive care include?   A yearly physical exam. This is also called an annual well check.  Dental exams once or twice a year.  Routine eye exams. Ask your health care provider how often you should have your eyes checked.  Personal lifestyle choices, including: ? Daily care of your teeth and gums. ? Regular physical activity. ? Eating a healthy diet. ? Avoiding tobacco and drug use. ? Limiting alcohol use. ? Practicing safe sex. ? Taking low-dose aspirin every day starting at age 50. What happens during an annual well check? The services and screenings done by your health care provider during your annual well check will depend on your age, overall health, lifestyle risk factors, and family history of disease. Counseling Your health care provider may ask you questions about your:  Alcohol use.  Tobacco use.  Drug use.  Emotional well-being.  Home and relationship well-being.  Sexual activity.  Eating habits.  Work and work environment. Screening You may have the following tests or measurements:  Height, weight, and BMI.  Blood pressure.  Lipid and cholesterol levels. These may be checked every 5 years, or more frequently if you are over 50 years old.  Skin check.  Lung cancer screening. You may have this screening every year starting at age 55 if you have a 30-pack-year history of smoking and currently smoke or have quit within the past 15 years.  Colorectal cancer screening. All adults should have this screening starting at age 50 and continuing until age 75. Your health care provider may recommend screening at age 45. You will have tests every 1-10 years, depending on your results and the type of screening test. People at increased risk should start screening at an earlier age. Screening tests may  include: ? Guaiac-based fecal occult blood testing. ? Fecal immunochemical test (FIT). ? Stool DNA test. ? Virtual colonoscopy. ? Sigmoidoscopy. During this test, a flexible tube with a tiny camera (sigmoidoscope) is used to examine your rectum and lower colon. The sigmoidoscope is inserted through your anus into your rectum and lower colon. ? Colonoscopy. During this test, a long, thin, flexible tube with a tiny camera (colonoscope) is used to examine your entire colon and rectum.  Prostate cancer screening. Recommendations will vary depending on your family history and other risks.  Hepatitis C blood test.  Hepatitis B blood test.  Sexually transmitted disease (STD) testing.  Diabetes screening. This is done by checking your blood sugar (glucose) after you have not eaten for a while (fasting). You may have this done every 1-3 years. Discuss your test results, treatment options, and if necessary, the need for more tests with your health care provider. Vaccines Your health care provider may recommend certain vaccines, such as:  Influenza vaccine. This is recommended every year.  Tetanus, diphtheria, and acellular pertussis (Tdap, Td) vaccine. You may need a Td booster every 10 years.  Varicella vaccine. You may need this if you have not been vaccinated.  Zoster vaccine. You may need this after age 60.  Measles, mumps, and rubella (MMR) vaccine. You may need at least one dose of MMR if you were born in 1957 or later. You may also need a second dose.  Pneumococcal 13-valent conjugate (PCV13) vaccine. You may need this if you have certain conditions and have not been vaccinated.  Pneumococcal polysaccharide (PPSV23) vaccine.   You may need one or two doses if you smoke cigarettes or if you have certain conditions.  Meningococcal vaccine. You may need this if you have certain conditions.  Hepatitis A vaccine. You may need this if you have certain conditions or if you travel or work in  places where you may be exposed to hepatitis A.  Hepatitis B vaccine. You may need this if you have certain conditions or if you travel or work in places where you may be exposed to hepatitis B.  Haemophilus influenzae type b (Hib) vaccine. You may need this if you have certain risk factors. Talk to your health care provider about which screenings and vaccines you need and how often you need them. This information is not intended to replace advice given to you by your health care provider. Make sure you discuss any questions you have with your health care provider. Document Released: 10/10/2015 Document Revised: 11/03/2017 Document Reviewed: 07/15/2015 Elsevier Interactive Patient Education  2019 [employer] Inc[employer]  

## 2019-02-13 LAB — CBC WITH DIFFERENTIAL/PLATELET
Absolute Monocytes: 768 cells/uL (ref 200–950)
Basophils Absolute: 99 cells/uL (ref 0–200)
Basophils Relative: 1.3 %
Eosinophils Absolute: 289 cells/uL (ref 15–500)
Eosinophils Relative: 3.8 %
HCT: 46 % (ref 38.5–50.0)
Hemoglobin: 16.2 g/dL (ref 13.2–17.1)
Lymphs Abs: 2424 cells/uL (ref 850–3900)
MCH: 30.4 pg (ref 27.0–33.0)
MCHC: 35.2 g/dL (ref 32.0–36.0)
MCV: 86.3 fL (ref 80.0–100.0)
MPV: 10.3 fL (ref 7.5–12.5)
Monocytes Relative: 10.1 %
Neutro Abs: 4020 cells/uL (ref 1500–7800)
Neutrophils Relative %: 52.9 %
Platelets: 219 10*3/uL (ref 140–400)
RBC: 5.33 10*6/uL (ref 4.20–5.80)
RDW: 12.5 % (ref 11.0–15.0)
Total Lymphocyte: 31.9 %
WBC: 7.6 10*3/uL (ref 3.8–10.8)

## 2019-02-13 LAB — COMPLETE METABOLIC PANEL WITH GFR
AG Ratio: 1.6 (calc) (ref 1.0–2.5)
ALT: 14 U/L (ref 9–46)
AST: 13 U/L (ref 10–35)
Albumin: 4.1 g/dL (ref 3.6–5.1)
Alkaline phosphatase (APISO): 96 U/L (ref 35–144)
BUN: 16 mg/dL (ref 7–25)
CO2: 27 mmol/L (ref 20–32)
Calcium: 9.4 mg/dL (ref 8.6–10.3)
Chloride: 104 mmol/L (ref 98–110)
Creat: 0.98 mg/dL (ref 0.70–1.33)
GFR, Est African American: 100 mL/min/{1.73_m2} (ref >=60)
GFR, Est Non African American: 86 mL/min/{1.73_m2} (ref >=60)
Globulin: 2.5 g/dL (calc) (ref 1.9–3.7)
Glucose, Bld: 99 mg/dL (ref 65–99)
Potassium: 3.8 mmol/L (ref 3.5–5.3)
Sodium: 140 mmol/L (ref 135–146)
Total Bilirubin: 0.7 mg/dL (ref 0.2–1.2)
Total Protein: 6.6 g/dL (ref 6.1–8.1)

## 2019-02-13 LAB — MICROALBUMIN / CREATININE URINE RATIO
Creatinine, Urine: 105 mg/dL (ref 20–320)
Microalb, Ur: <0.2 mg/dL

## 2019-02-13 LAB — VITAMIN D 25 HYDROXY (VIT D DEFICIENCY, FRACTURES): Vit D, 25-Hydroxy: 43 ng/mL (ref 30–100)

## 2019-02-13 LAB — LIPID PANEL
Cholesterol: 149 mg/dL (ref <200)
HDL: 47 mg/dL (ref >=40)
LDL Cholesterol (Calc): 82 mg/dL (calc)
Non-HDL Cholesterol (Calc): 102 mg/dL (calc) (ref <130)
Total CHOL/HDL Ratio: 3.2 (calc) (ref <5.0)
Triglycerides: 104 mg/dL (ref <150)

## 2019-02-13 LAB — B12 AND FOLATE PANEL
Folate: 11.1 ng/mL
Vitamin B-12: 631 pg/mL (ref 200–1100)

## 2019-02-13 LAB — PSA: PSA: 0.6 ng/mL (ref <=4.0)

## 2019-03-23 ENCOUNTER — Ambulatory Visit (INDEPENDENT_AMBULATORY_CARE_PROVIDER_SITE_OTHER): Payer: 59 | Admitting: Nurse Practitioner

## 2019-03-23 ENCOUNTER — Encounter: Payer: Self-pay | Admitting: Nurse Practitioner

## 2019-03-23 ENCOUNTER — Other Ambulatory Visit: Payer: Self-pay

## 2019-03-23 VITALS — Temp 97.7°F | Resp 16

## 2019-03-23 DIAGNOSIS — J0100 Acute maxillary sinusitis, unspecified: Secondary | ICD-10-CM

## 2019-03-23 DIAGNOSIS — E1049 Type 1 diabetes mellitus with other diabetic neurological complication: Secondary | ICD-10-CM

## 2019-03-23 DIAGNOSIS — N521 Erectile dysfunction due to diseases classified elsewhere: Secondary | ICD-10-CM

## 2019-03-23 DIAGNOSIS — J302 Other seasonal allergic rhinitis: Secondary | ICD-10-CM | POA: Diagnosis not present

## 2019-03-23 MED ORDER — AMOXICILLIN-POT CLAVULANATE 875-125 MG PO TABS: 1.0000 | ORAL_TABLET | Freq: Two times a day (BID) | ORAL | 0 refills | Status: DC

## 2019-03-23 NOTE — Progress Notes (Signed)
Virtual Visit via Video Note  I connected with Ivan Booker on 03/23/19 at 11:20 AM EDT by a video enabled telemedicine application and verified that I am speaking with the correct person using two identifiers.   Staff discussed the limitations of evaluation and management by telemedicine and the availability of in person appointments. The patient expressed understanding and agreed to proceed.  Patient location: home  My location: work office Other people present:  none HPI  Patient endorses some nasal congestion and fullness 2-3 days ago. States last night was having a lot of post nasal drainage and this morning was coughing up some phlegm. Patient endorses mild maxillary fullness. Endorses some hoarseness and nasal congestion.  Denies fevers, chills, shortness of breath, chest tightness, sore throat, muffled sounds.  Has seasonal allergies- takes sudafed PRN Diabetes type 1- 120 this morning, after meal was 200.   [facility] 03/23/2019 02/12/2019 11/22/2018 08/21/2018 05/02/2018  Decreased Interest 0 0 0 0 0  Down, Depressed, Hopeless 0 0 0 0 0  PHQ - 2 Score 0 0 0 0 0  Altered sleeping 0 0 0 - 0  Tired, decreased energy 0 0 2 - 0  Change in appetite 0 0 0 - 0  Feeling bad or failure about yourself  0 0 0 - 0  Trouble concentrating 0 0 0 - 0  Moving slowly or fidgety/restless 0 0 0 - 0  Suicidal thoughts 0 0 0 - 0  PHQ-9 Score 0 0 2 - 0  Difficult doing work/chores Not difficult at all Not difficult at all Not difficult at all - Not difficult at all     PHQ reviewed. Negative  Patient Active Problem List   Diagnosis Date Noted  . Erectile dysfunction 06/26/2015  . BPH (benign prostatic hyperplasia) 03/20/2015  . 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 Dyslipidemia 03/07/2015  . Hypogonadism male 03/07/2015  . Obesity (BMI 30.0-34.9) 03/07/2015  . Allergic rhinitis, seasonal 03/07/2015  . Vitamin D  deficiency 03/07/2015    Past Medical History:  Diagnosis Date  . Allergy   . Diabetes mellitus without complication (HCC)   . Hyperlipidemia   . Hypertension   . Hypogonadism, male     Past Surgical History:  Procedure Laterality Date  . VASECTOMY  1993    Social History   Tobacco Use  . Smoking status: Never Smoker  . Smokeless tobacco: Never Used  Substance Use Topics  . Alcohol use: Yes    Comment: 1-2 beers on weekends     Current Outpatient Medications:  .  aspirin 81 MG chewable tablet, Chew 1 tablet by mouth daily., Disp: , Rfl:  .  atorvastatin (LIPITOR) 20 MG tablet, Take 1 tablet (20 mg total) by mouth daily., Disp: 90 tablet, Rfl: 1 .  Cholecalciferol (VITAMIN D) 2000 UNITS CAPS, Take 1 capsule by mouth daily., Disp: , Rfl:  .  insulin lispro (HUMALOG KWIKPEN) 100 UNIT/ML KwikPen, INJECT 5 TO 16 UNITS INTO THE SKIN with meals, Disp: 45 mL, Rfl: 2 .  metFORMIN (GLUCOPHAGE-XR) 500 MG 24 hr tablet, TAKE 1 TABLET BY MOUTH DAILY WITH BREAKFAST, Disp: 90 tablet, Rfl: 1 .  tadalafil (CIALIS) 5 MG tablet, Take 1 tablet (5 mg total) by mouth daily as needed for erectile dysfunction., Disp: 90 tablet, Rfl: 1 .  TRESIBA FLEXTOUCH 200 UNIT/ML SOPN, INJECT 36 UNITS INTO THE SKIN DAILY., Disp: 9 mL, Rfl: 2 .  amoxicillin-clavulanate (AUGMENTIN) 875-125 MG  tablet, Take 1 tablet by mouth 2 (two) times daily., Disp: 20 tablet, Rfl: 0 .  glucose blood test strip, Check fsbs 5 times daily, Disp: 300 each, Rfl: 2 .  Insulin Pen Needle (UNIFINE PENTIPS) 31G X 8 MM MISC, 1 each by Other route See admin instructions., Disp: 100 each, Rfl: 5 .  Lancets (FREESTYLE) lancets, Use as instructed, Disp: 100 each, Rfl: 12  No Known Allergies  ROS   No other specific complaints in a complete review of systems (except as listed in HPI above).  Objective  Vitals:   03/23/19 1117  Resp: 16  Temp: 97.7 F (36.5 C)     There is no height or weight on file to calculate BMI.  Nursing  Note and Vital Signs reviewed.  Physical Exam   Constitutional: Patient appears well-developed and well-nourished. No distress.  HENT: Head: Normocephalic and atraumatic. Mild maxillary tenderness.  Pulmonary/Chest: Effort normal  Musculoskeletal: Normal range of motion,  Neurological: alert and oriented, speech normal.  Skin: No rash noted. No erythema.  Psychiatric: Patient has a normal mood and affect. behavior is normal. Judgment and thought content normal.    Assessment & Plan  1. Acute non-recurrent maxillary sinusitis Take daily antihistamine, fluids, healthy eating.  Delayed antibiotic prescribing- to take if second worsening or lasting greater than 10 days  - amoxicillin-clavulanate (AUGMENTIN) 875-125 MG tablet; Take 1 tablet by mouth 2 (two) times daily.  Dispense: 20 tablet; Refill: 0 Due to pandemic recommend daily temperature check and self-isolating.  2. Seasonal allergies Daily antihistamine   3. 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) Discussed sick day care    Follow Up Instructions:   PRN I discussed the assessment and treatment plan with the patient. The patient was provided an opportunity to ask questions and all were answered. The patient agreed with the plan and demonstrated an understanding of the instructions.   The patient was advised to call back or seek an in-person evaluation if the symptoms worsen or if the condition fails to improve as anticipated.  I provided 14 minutes of non-face-to-face time during this encounter.   Fredderick Severance, NP

## 2019-04-17 ENCOUNTER — Other Ambulatory Visit: Payer: Self-pay | Admitting: Family Medicine

## 2019-04-17 DIAGNOSIS — E1049 Type 1 diabetes mellitus with other diabetic neurological complication: Secondary | ICD-10-CM

## 2019-04-17 DIAGNOSIS — N521 Erectile dysfunction due to diseases classified elsewhere: Secondary | ICD-10-CM

## 2019-04-17 NOTE — Telephone Encounter (Signed)
Request for diabetes medication. Ivan Booker to [facility]  Last office visit pertaining to diabetes: 02/12/2019   Lab Results  Component Value Date   HGBA1C 6.9 02/12/2019    Follow up on 05/15/2019

## 2019-05-15 ENCOUNTER — Ambulatory Visit (INDEPENDENT_AMBULATORY_CARE_PROVIDER_SITE_OTHER): Payer: 59 | Admitting: Family Medicine

## 2019-05-15 ENCOUNTER — Other Ambulatory Visit: Payer: Self-pay

## 2019-05-15 ENCOUNTER — Encounter: Payer: Self-pay | Admitting: Family Medicine

## 2019-05-15 VITALS — Ht 74.0 in

## 2019-05-15 DIAGNOSIS — E139 Other specified diabetes mellitus without complications: Secondary | ICD-10-CM

## 2019-05-15 DIAGNOSIS — E1049 Type 1 diabetes mellitus with other diabetic neurological complication: Secondary | ICD-10-CM | POA: Diagnosis not present

## 2019-05-15 DIAGNOSIS — E785 Hyperlipidemia, unspecified: Secondary | ICD-10-CM

## 2019-05-15 DIAGNOSIS — N521 Erectile dysfunction due to diseases classified elsewhere: Secondary | ICD-10-CM

## 2019-05-15 DIAGNOSIS — E1069 Type 1 diabetes mellitus with other specified complication: Secondary | ICD-10-CM | POA: Diagnosis not present

## 2019-05-15 DIAGNOSIS — E291 Testicular hypofunction: Secondary | ICD-10-CM

## 2019-05-15 NOTE — Progress Notes (Signed)
Name: Ivan Booker Navarro   MRN: [id number]    DOB: Nov 29, 1965   Date:05/15/2019       Progress Note  Subjective  Chief Complaint  Chief Complaint  Patient presents with  . Medication Refill    3 month F/U  . Diabetes    Checks BS at least four times daily 30 day Average-160  . Hyperlipidemia  . Benign Prostatic Hypertrophy  . Hypogonadism    I connected with  Ivan Booker Hiltz  on 05/15/19 at  2:20 PM EDT by a video enabled telemedicine application and verified that I am speaking with the correct person using two identifiers.  I discussed the limitations of evaluation and management by telemedicine and the availability of in person appointments. The patient expressed understanding and agreed to proceed. Staff also discussed with the patient that there may be a patient responsible charge related to this service. Patient Location: at work  Provider Location: [facility]   HPI  DMI with ED: he is doing well. He states glucose still drops occasionally when physically active and over shoots the insulin. He states glucose drops with activity only, he denies  episodesof hypoglycemiawhile driving, no episodes that required assistance from another person. No polyphagia, polyuria or polydipsia. ED he is now on cialis . His hgbA1C is at goal on his last visit 6.9 % .He was diagnosed with DM as an young adult ( mid-20's ) and has good knowledge of his condition. He is compliant with diet  Average glucose for the past 30 days was 160 , only a couple times had low sugar in the 60 or 70's .   BPH: used to see Urologist - Dr. Lonna CobbStoioff - years ago, had normal biopsies in the past. He is taking Cialis for BPJ and also for ED   Hypogonadism: heis no longer on testosterone supplementation because insurance denied coverage, energy level is normal.   Hyperlipidemia: at goal, taking Atorvastatin daily, no cramping or chest pain.Reviewed last labs  Patient Active Problem List   Diagnosis Date Noted  . Erectile dysfunction 06/26/2015  . BPH (benign prostatic hyperplasia) 03/20/2015  . 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 Dyslipidemia 03/07/2015  . Hypogonadism male 03/07/2015  . Obesity (BMI 30.0-34.9) 03/07/2015  . Allergic rhinitis, seasonal 03/07/2015  . Vitamin D deficiency 03/07/2015    Past Surgical History:  Procedure Laterality Date  . VASECTOMY  1993    Family History  Problem Relation Age of Onset  . COPD Father        Smoker     Social History   Socioeconomic History  . Marital status: Married    Spouse name: Zella BallRobin  . Number of children: 5  . Years of education: Not on file  . Highest education level: High school graduate  Occupational History  . Not on file  Social Needs  . Financial resource strain: Not hard at all  . Food insecurity    Worry: Never true    Inability: Never true  . Transportation needs    Medical: No    Non-medical: No  Tobacco Use  . Smoking status: Never Smoker  . Smokeless tobacco: Never Used  Substance and Sexual Activity  . Alcohol use: Yes    Comment: 1-2 beers on weekends  . Drug use: No  . Sexual activity: Yes    Partners: Female    Birth control/protection: None  Lifestyle  . Physical activity    Days  per week: 7 days    Minutes per session: 60 min  . Stress: Not at all  Relationships  . Social connections    Talks on phone: More than three times a week    Gets together: More than three times a week    Attends religious service: More than 4 times per year    Active member of club or organization: No    Attends meetings of clubs or organizations: Never    Relationship status: Married  . Intimate partner violence    Fear of current or ex partner: No    Emotionally abused: No    Physically abused: No    Forced sexual activity: No  Other Topics Concern  . Not on file  Social History Narrative  . Not on file     Current Outpatient  Medications:  .  aspirin 81 MG chewable tablet, Chew 1 tablet by mouth daily., Disp: , Rfl:  .  atorvastatin (LIPITOR) 20 MG tablet, Take 1 tablet (20 mg total) by mouth daily., Disp: 90 tablet, Rfl: 1 .  Cholecalciferol (VITAMIN D) 2000 UNITS CAPS, Take 1 capsule by mouth daily., Disp: , Rfl:  .  glucose blood test strip, Check fsbs 5 times daily, Disp: 300 each, Rfl: 2 .  insulin lispro (HUMALOG KWIKPEN) 100 UNIT/ML KwikPen, INJECT 5 TO 16 UNITS INTO THE SKIN with meals, Disp: 45 mL, Rfl: 2 .  Insulin Pen Needle (UNIFINE PENTIPS) 31G X 8 MM MISC, 1 each by Other route See admin instructions., Disp: 100 each, Rfl: 5 .  Lancets (FREESTYLE) lancets, Use as instructed, Disp: 100 each, Rfl: 12 .  metFORMIN (GLUCOPHAGE-XR) 500 MG 24 hr tablet, TAKE 1 TABLET BY MOUTH DAILY WITH BREAKFAST, Disp: 90 tablet, Rfl: 1 .  tadalafil (CIALIS) 5 MG tablet, Take 1 tablet (5 mg total) by mouth daily as needed for erectile dysfunction., Disp: 90 tablet, Rfl: 1 .  TRESIBA FLEXTOUCH 200 UNIT/ML SOPN, INJECT 36 UNITS INTO THE SKIN DAILY., Disp: 9 mL, Rfl: 2 .  amoxicillin-clavulanate (AUGMENTIN) 875-125 MG tablet, Take 1 tablet by mouth 2 (two) times daily. (Patient not taking: Reported on 05/15/2019), Disp: 20 tablet, Rfl: 0  No Known Allergies  I personally reviewed active problem list, medication list, allergies, family history, social history with the patient/caregiver today.   ROS  Ten systems reviewed and is negative except as mentioned in HPI   Objective  Virtual encounter, vitals not obtained.  Body mass index is 30.2 kg/m.  Physical Exam  Awake, alert and oriented   PHQ2/9: Depression screen [facility] 2/9 05/15/2019 03/23/2019 02/12/2019 11/22/2018 08/21/2018  Decreased Interest 0 0 0 0 0  Down, Depressed, Hopeless 0 0 0 0 0  PHQ - 2 Score 0 0 0 0 0  Altered sleeping 0 0 0 0 -  Tired, decreased energy 0 0 0 2 -  Change in appetite 0 0 0 0 -  Feeling bad or failure about yourself  0 0 0 0 -   Trouble concentrating 0 0 0 0 -  Moving slowly or fidgety/restless 0 0 0 0 -  Suicidal thoughts 0 0 0 0 -  PHQ-9 Score 0 0 0 2 -  Difficult doing work/chores Not difficult at all Not difficult at all Not difficult at all Not difficult at all -  Some recent data might be hidden   PHQ-2/9 Result is negative.    Fall Risk: Fall Risk  05/15/2019 03/23/2019 02/12/2019 11/22/2018 08/21/2018  Falls in the past  year? 0 0 0 0 0  Number falls in past yr: 0 0 0 0 0  Comment - - - - -  Injury with Fall? 0 0 0 0 -  Comment - - - - -  Follow up - - - - -     Assessment & Plan  1. Dyslipidemia  On statin therapy   2. LADA (latent autoimmune diabetes in adults), managed as type 1 (HCC)  Doing well on current regiment  3. Diabetic erectile dysfunction associated with type 1 diabetes mellitus (HCC)  Doing well on cialis   4. 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)   5. Hypogonadism male  Not on testosterone because of cost  I discussed the assessment and treatment plan with the patient. The patient was provided an opportunity to ask questions and all were answered. The patient agreed with the plan and demonstrated an understanding of the instructions.  The patient was advised to call back or seek an in-person evaluation if the symptoms worsen or if the condition fails to improve as anticipated.  I provided 25 minutes of non-face-to-face time during this encounter.

## 2019-05-16 LAB — POCT GLYCOSYLATED HEMOGLOBIN (HGB A1C): Hemoglobin A1C: 7 % — AB (ref 4.0–5.6)

## 2019-05-16 NOTE — Addendum Note (Signed)
Addended by: Chilton Greathouse on: 05/16/2019 03:51 PM   Modules accepted: Orders

## 2019-07-30 ENCOUNTER — Telehealth: Payer: Self-pay | Admitting: Family Medicine

## 2019-07-30 DIAGNOSIS — E1069 Type 1 diabetes mellitus with other specified complication: Secondary | ICD-10-CM

## 2019-08-02 ENCOUNTER — Other Ambulatory Visit: Payer: Self-pay | Admitting: Family Medicine

## 2019-08-02 DIAGNOSIS — E1069 Type 1 diabetes mellitus with other specified complication: Secondary | ICD-10-CM

## 2019-08-02 NOTE — Telephone Encounter (Signed)
Pt called and stated that he will run out of medication on 08/10/19. Please advise

## 2019-08-03 ENCOUNTER — Other Ambulatory Visit: Payer: Self-pay | Admitting: Family Medicine

## 2019-08-03 DIAGNOSIS — E1069 Type 1 diabetes mellitus with other specified complication: Secondary | ICD-10-CM

## 2019-08-06 NOTE — Telephone Encounter (Signed)
lvm to inform pt rx is at the pharmacy

## 2019-08-15 ENCOUNTER — Ambulatory Visit (INDEPENDENT_AMBULATORY_CARE_PROVIDER_SITE_OTHER): Payer: 59 | Admitting: Family Medicine

## 2019-08-15 ENCOUNTER — Encounter: Payer: Self-pay | Admitting: Family Medicine

## 2019-08-15 ENCOUNTER — Other Ambulatory Visit: Payer: Self-pay

## 2019-08-15 DIAGNOSIS — E8881 Metabolic syndrome: Secondary | ICD-10-CM | POA: Diagnosis not present

## 2019-08-15 DIAGNOSIS — E1049 Type 1 diabetes mellitus with other diabetic neurological complication: Secondary | ICD-10-CM

## 2019-08-15 DIAGNOSIS — R350 Frequency of micturition: Secondary | ICD-10-CM

## 2019-08-15 DIAGNOSIS — E785 Hyperlipidemia, unspecified: Secondary | ICD-10-CM

## 2019-08-15 DIAGNOSIS — N521 Erectile dysfunction due to diseases classified elsewhere: Secondary | ICD-10-CM | POA: Diagnosis not present

## 2019-08-15 DIAGNOSIS — E291 Testicular hypofunction: Secondary | ICD-10-CM | POA: Diagnosis not present

## 2019-08-15 DIAGNOSIS — N401 Enlarged prostate with lower urinary tract symptoms: Secondary | ICD-10-CM | POA: Diagnosis not present

## 2019-08-15 DIAGNOSIS — E559 Vitamin D deficiency, unspecified: Secondary | ICD-10-CM

## 2019-08-15 DIAGNOSIS — E139 Other specified diabetes mellitus without complications: Secondary | ICD-10-CM

## 2019-08-15 DIAGNOSIS — E1069 Type 1 diabetes mellitus with other specified complication: Secondary | ICD-10-CM

## 2019-08-15 MED ORDER — ATORVASTATIN CALCIUM 20 MG PO TABS: 20.0000 mg | ORAL_TABLET | Freq: Every day | ORAL | 1 refills | Status: DC

## 2019-08-15 NOTE — Progress Notes (Signed)
Name: Ivan Booker   MRN: [id number]    DOB: December 04, 1965   Date:08/15/2019       Progress Note  Subjective  Chief Complaint  Chief Complaint  Patient presents with  . Medication Refill  . Diabetes  . Benign Prostatic Hypertrophy  . Hypogonadism  . Hyperlipidemia    I connected with  [name] on 08/15/19 at  3:40 PM EST by telephone and verified that I am speaking with the correct person using two identifiers.  I discussed the limitations, risks, security and privacy concerns of performing an evaluation and management service by telephone and the availability of in person appointments. Staff also discussed with the patient that there may be a patient responsible charge related to this service. Patient Location: at home  Provider Location: [facility]   HPI  DMI with ED: he is doing well. He states glucose still drops occasionally when physically active and over shoots the insulin.He states glucose drops with activity only, he deniesepisodesof hypoglycemiawhile driving, no episodes that required assistance from another person.No polyphagia, polyuria or polydipsia. ED he is now on cialis .His hgbA1C was 7 % on his last visit He was diagnosed with DM as an young adult ( mid-20's ) and has good knowledge of his condition. He is compliant with diet Average glucose for the past 30 days was 148 , he had one low glucose , down to 50's when he first got up, he states he was not diaphoretic. Once he recheck level it was back to normal. The highest was around 280   BPH: used to see Urologist - Dr. Lonna Cobb - years ago, had normal biopsies in the past. He is taking Cialis for BPH and also for ED   Hypogonadism: heis no longer on testosterone supplementation because insurance denied coverage, energy level is normal, also normal libido  Hyperlipidemia: at goal, taking Atorvastatin daily, no cramping or chest pain.LDL has been normal    Patient Active  Problem List   Diagnosis Date Noted  . Erectile dysfunction 06/26/2015  . BPH (benign prostatic hyperplasia) 03/20/2015  . 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 Dyslipidemia 03/07/2015  . Hypogonadism male 03/07/2015  . Obesity (BMI 30.0-34.9) 03/07/2015  . Allergic rhinitis, seasonal 03/07/2015  . Vitamin D deficiency 03/07/2015    Past Surgical History:  Procedure Laterality Date  . VASECTOMY  1993    Family History  Problem Relation Age of Onset  . COPD Father        Smoker     Social History   Socioeconomic History  . Marital status: Married    Spouse name: Zella Ball  . Number of children: 5  . Years of education: Not on file  . Highest education level: High school graduate  Occupational History  . Not on file  Social Needs  . Financial resource strain: Not hard at all  . Food insecurity    Worry: Never true    Inability: Never true  . Transportation needs    Medical: No    Non-medical: No  Tobacco Use  . Smoking status: Never Smoker  . Smokeless tobacco: Never Used  Substance and Sexual Activity  . Alcohol use: Yes    Comment: 1-2 beers on weekends  . Drug use: No  . Sexual activity: Yes    Partners: Female    Birth control/protection: None  Lifestyle  . Physical activity    Days per week: 7 days  Minutes per session: 60 min  . Stress: Not at all  Relationships  . Social connections    Talks on phone: More than three times a week    Gets together: More than three times a week    Attends religious service: More than 4 times per year    Active member of club or organization: No    Attends meetings of clubs or organizations: Never    Relationship status: Married  . Intimate partner violence    Fear of current or ex partner: No    Emotionally abused: No    Physically abused: No    Forced sexual activity: No  Other Topics Concern  . Not on file  Social History Narrative  . Not on file      Current Outpatient Medications:  .  aspirin 81 MG chewable tablet, Chew 1 tablet by mouth daily., Disp: , Rfl:  .  atorvastatin (LIPITOR) 20 MG tablet, Take 1 tablet (20 mg total) by mouth daily., Disp: 90 tablet, Rfl: 1 .  Cholecalciferol (VITAMIN D) 2000 UNITS CAPS, Take 1 capsule by mouth daily., Disp: , Rfl:  .  glucose blood test strip, Check fsbs 5 times daily, Disp: 300 each, Rfl: 2 .  insulin lispro (HUMALOG KWIKPEN) 100 UNIT/ML KwikPen, INJECT 5 TO 16 UNITS INTO THE SKIN with meals, Disp: 45 mL, Rfl: 2 .  Insulin Pen Needle (UNIFINE PENTIPS) 31G X 8 MM MISC, 1 each by Other route See admin instructions., Disp: 100 each, Rfl: 5 .  Lancets (FREESTYLE) lancets, Use as instructed, Disp: 100 each, Rfl: 12 .  metFORMIN (GLUCOPHAGE-XR) 500 MG 24 hr tablet, TAKE 1 TABLET BY MOUTH DAILY WITH BREAKFAST, Disp: 90 tablet, Rfl: 1 .  tadalafil (CIALIS) 5 MG tablet, TAKE ONE TABLET BY MOUTH DAILY AS NEEDED FOR ERECTILE DYSFUNCTION, Disp: 90 tablet, Rfl: 0 .  TRESIBA FLEXTOUCH 200 UNIT/ML SOPN, INJECT 36 UNITS INTO THE SKIN DAILY., Disp: 9 mL, Rfl: 2  No Known Allergies  I personally reviewed active problem list, medication list, allergies, family history, social history, health maintenance with the patient/caregiver today.   ROS  Ten systems reviewed and is negative except as mentioned in HPI   Objective  Virtual encounter, vitals not obtained.  There is no height or weight on file to calculate BMI.  Physical Exam  Awake, alert and oriented   PHQ2/9: Depression screen [facility] 2/9 08/15/2019 05/15/2019 03/23/2019 02/12/2019 11/22/2018  Decreased Interest 0 0 0 0 0  Down, Depressed, Hopeless 0 0 0 0 0  PHQ - 2 Score 0 0 0 0 0  Altered sleeping 0 0 0 0 0  Tired, decreased energy 0 0 0 0 2  Change in appetite 0 0 0 0 0  Feeling bad or failure about yourself  0 0 0 0 0  Trouble concentrating 0 0 0 0 0  Moving slowly or fidgety/restless 0 0 0 0 0  Suicidal thoughts 0 0 0 0 0  PHQ-9 Score 0 0  0 0 2  Difficult doing work/chores Not difficult at all Not difficult at all Not difficult at all Not difficult at all Not difficult at all  Some recent data might be hidden   PHQ-2/9 Result is negative.    Fall Risk: Fall Risk  08/15/2019 05/15/2019 03/23/2019 02/12/2019 11/22/2018  Falls in the past year? 0 0 0 0 0  Number falls in past yr: 0 0 0 0 0  Comment - - - - -  Injury with  Fall? 0 0 0 0 0  Comment - - - - -  Follow up - - - - -    Assessment & Plan  1. Insulin resistance  He stopped metformin because it was causing upset stomach   2. Dyslipidemia  - atorvastatin (LIPITOR) 20 MG tablet; Take 1 tablet (20 mg total) by mouth daily.  Dispense: 90 tablet; Refill: 1  3. LADA (latent autoimmune diabetes in adults), managed as type 1 (HCC)  Doing well, he will return tomorrow for A1C  4. Diabetic erectile dysfunction associated with type 1 diabetes mellitus (HCC)  Continue Cialis   5. Vitamin D deficiency   6. Hypogonadism male  Not on testosterone supplementation but is doing well   8. Benign prostatic hyperplasia with urinary frequency  Stable on Cialis  I discussed the assessment and treatment plan with the patient. The patient was provided an opportunity to ask questions and all were answered. The patient agreed with the plan and demonstrated an understanding of the instructions.   The patient was advised to call back or seek an in-person evaluation if the symptoms worsen or if the condition fails to improve as anticipated.  I provided 25 minutes of non-face-to-face time during this encounter.  Ruel FavorsKrichna F Dajaun Goldring, MD

## 2019-08-16 ENCOUNTER — Ambulatory Visit (INDEPENDENT_AMBULATORY_CARE_PROVIDER_SITE_OTHER): Payer: 59

## 2019-08-16 DIAGNOSIS — N521 Erectile dysfunction due to diseases classified elsewhere: Secondary | ICD-10-CM | POA: Diagnosis not present

## 2019-08-16 DIAGNOSIS — E1049 Type 1 diabetes mellitus with other diabetic neurological complication: Secondary | ICD-10-CM

## 2019-08-16 LAB — POCT GLYCOSYLATED HEMOGLOBIN (HGB A1C): HbA1c, POC (controlled diabetic range): 6.9 % (ref 0.0–7.0)

## 2019-09-17 ENCOUNTER — Other Ambulatory Visit: Payer: Self-pay | Admitting: Family Medicine

## 2019-09-17 DIAGNOSIS — E1049 Type 1 diabetes mellitus with other diabetic neurological complication: Secondary | ICD-10-CM

## 2019-10-23 ENCOUNTER — Other Ambulatory Visit: Payer: Self-pay | Admitting: Family Medicine

## 2019-10-23 DIAGNOSIS — E1069 Type 1 diabetes mellitus with other specified complication: Secondary | ICD-10-CM

## 2019-10-23 MED ORDER — TADALAFIL 5 MG PO TABS: ORAL_TABLET | ORAL | 0 refills | Status: DC

## 2019-10-23 NOTE — Telephone Encounter (Signed)
Medication Refill - Medication: tadalafil 5 mg  Has the patient contacted their pharmacy? No. (Agent: If no, request that the patient contact the pharmacy for the refill.) (Agent: If yes, when and what did the pharmacy advise?) Preferred Pharmacy (with phone number or street name): [facility]  Agent: Please be advised that RX refills may take up to 3 business days. We ask that you follow-up with your pharmacy.

## 2019-10-24 ENCOUNTER — Other Ambulatory Visit: Payer: Self-pay | Admitting: Family Medicine

## 2019-10-24 DIAGNOSIS — N521 Erectile dysfunction due to diseases classified elsewhere: Secondary | ICD-10-CM

## 2019-10-24 DIAGNOSIS — E1069 Type 1 diabetes mellitus with other specified complication: Secondary | ICD-10-CM

## 2019-10-24 NOTE — Telephone Encounter (Signed)
Requested Prescriptions  Pending Prescriptions Disp Refills  . tadalafil (CIALIS) 5 MG tablet [Pharmacy Med Name: TADALAFIL 5 MG TABLET] 90 tablet 0    Sig: TAKE ONE TABLET BY MOUTH DAILY AS NEEDED FOR ERECTILE DYSFUNCTION     Urology: Erectile Dysfunction Agents Passed - 10/24/2019 10:40 AM      Passed - Last BP in normal range    BP Readings from Last 1 Encounters:  02/12/19 126/74         Passed - Valid encounter within last 12 months    Recent Outpatient Visits          2 months ago LADA (latent autoimmune diabetes in adults), managed as type 1 [facility])   [facility] [name], MD   5 months ago Dyslipidemia   [facility] [name], MD   7 months ago Acute non-recurrent maxillary sinusitis   [facility] Sharyon Cable E, NP   8 months ago Type 1 diabetes mellitus with neuropathy causing erectile dysfunction [facility])   [facility] [name], MD   11 months ago Type 1 diabetes mellitus with neuropathy causing erectile dysfunction [facility])   [facility] [name], MD      Future Appointments            In 4 weeks [name], MD [facility], [facility]

## 2019-11-07 ENCOUNTER — Telehealth: Payer: Self-pay | Admitting: Family Medicine

## 2019-11-07 NOTE — Telephone Encounter (Signed)
Pt called stating that he increased his dose of the tadalafil. Pt states that on 11/04/19 he started taking 2 tablets instead of one. Pt states that the medication was not working. Please advise.

## 2019-11-09 ENCOUNTER — Other Ambulatory Visit: Payer: Self-pay | Admitting: Family Medicine

## 2019-11-09 DIAGNOSIS — E1049 Type 1 diabetes mellitus with other diabetic neurological complication: Secondary | ICD-10-CM

## 2019-11-09 DIAGNOSIS — E1069 Type 1 diabetes mellitus with other specified complication: Secondary | ICD-10-CM

## 2019-11-09 DIAGNOSIS — N521 Erectile dysfunction due to diseases classified elsewhere: Secondary | ICD-10-CM

## 2019-11-21 ENCOUNTER — Encounter: Payer: Self-pay | Admitting: Family Medicine

## 2019-11-21 ENCOUNTER — Ambulatory Visit: Payer: 59 | Admitting: Family Medicine

## 2019-11-21 ENCOUNTER — Other Ambulatory Visit: Payer: Self-pay

## 2019-11-21 VITALS — BP 120/84 | HR 81 | Temp 97.1°F | Resp 18 | Ht 74.0 in | Wt 244.5 lb

## 2019-11-21 DIAGNOSIS — E1049 Type 1 diabetes mellitus with other diabetic neurological complication: Secondary | ICD-10-CM

## 2019-11-21 DIAGNOSIS — E1069 Type 1 diabetes mellitus with other specified complication: Secondary | ICD-10-CM | POA: Diagnosis not present

## 2019-11-21 DIAGNOSIS — E785 Hyperlipidemia, unspecified: Secondary | ICD-10-CM

## 2019-11-21 DIAGNOSIS — E8881 Metabolic syndrome: Secondary | ICD-10-CM | POA: Diagnosis not present

## 2019-11-21 DIAGNOSIS — E559 Vitamin D deficiency, unspecified: Secondary | ICD-10-CM | POA: Diagnosis not present

## 2019-11-21 DIAGNOSIS — N521 Erectile dysfunction due to diseases classified elsewhere: Secondary | ICD-10-CM

## 2019-11-21 LAB — POCT GLYCOSYLATED HEMOGLOBIN (HGB A1C): HbA1c, POC (controlled diabetic range): 7.9 % — AB (ref 0.0–7.0)

## 2019-11-21 MED ORDER — TADALAFIL 10 MG PO TABS: ORAL_TABLET | ORAL | 0 refills | Status: DC

## 2019-11-21 NOTE — Patient Instructions (Addendum)
Get wraps to use in place of bread from [employer] Cheese sticks Cherry tomatoes Whips crackers ( made of 100 % cheese )  Tree nuts  Cut down on bread/chips.  Start using exercise bike when not golfing.   Take Tadalafil 10 mg every 3 days and if not enough you can go up to 20 mg every three days and let me know what dose you decide so I can send new prescription

## 2019-11-21 NOTE — Progress Notes (Signed)
Name: Ivan Booker   MRN: [id number]    DOB: 1966/02/01   Date:11/21/2019       Progress Note  Subjective  Chief Complaint  Chief Complaint  Patient presents with  . Diabetes    3 month follow up  . Hyperlipidemia    HPI   DMI with ED: he is doing well. He states glucose still drops occasionally when physically active and over shoots the insulin.He states glucose drops with activity only, he deniesepisodesof hypoglycemiawhile driving, no episodes that required assistance from another person.No polyphagia, polyuria or polydipsia. EDhe is now on Cialis. He was diagnosed with DM as an young adult ( mid-20's ) and has good knowledge of his condition. He iscompliant with diet, however he changed his job , he is driving again , not as active like he was. Snacking more on crackers and having more sandwiches and A1C is now 7.9%. He also states unable to go golfing as often secondary to weather and has gained weight.   BPH: used to see Urologist - Dr. Lonna Cobb - years ago, had normal biopsies in the past.He is taking Cialis for BPH and also for ED, however recently he has noticed that cialis 5 mg has not helped with erection , advised to go up to 10 mg every 3 days and if does nor work can increase to 20 mg every three days  Hypogonadism: heis no longer on testosterone supplementation because insurance denied coverage, energy level is normal, also normal libido. Unchanged   Hyperlipidemia: at goal, taking Atorvastatin daily, no cramping or chest pain.LDL has been normal    Patient Active Problem List   Diagnosis Date Noted  . Erectile dysfunction 06/26/2015  . BPH (benign prostatic hyperplasia) 03/20/2015  . 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 Dyslipidemia 03/07/2015  . Hypogonadism male 03/07/2015  . Obesity (BMI 30.0-34.9) 03/07/2015  . Allergic rhinitis, seasonal 03/07/2015  . Vitamin D deficiency 03/07/2015     Past Surgical History:  Procedure Laterality Date  . VASECTOMY  1993    Family History  Problem Relation Age of Onset  . COPD Father        Smoker     Social History   Tobacco Use  . Smoking status: Never Smoker  . Smokeless tobacco: Never Used  Substance Use Topics  . Alcohol use: Yes    Comment: 1-2 beers on weekends  . Drug use: No     Current Outpatient Medications:  .  aspirin 81 MG chewable tablet, Chew 1 tablet by mouth daily., Disp: , Rfl:  .  atorvastatin (LIPITOR) 20 MG tablet, Take 1 tablet (20 mg total) by mouth daily., Disp: 90 tablet, Rfl: 1 .  Cholecalciferol (VITAMIN D) 2000 UNITS CAPS, Take 1 capsule by mouth daily., Disp: , Rfl:  .  glucose blood (FREESTYLE LITE) test strip, CHECK FASTING BLOOD SUGARS 5 TIMES DAILY, Disp: 300 strip, Rfl: 2 .  insulin lispro (HUMALOG KWIKPEN) 100 UNIT/ML KwikPen, INJECT 5 TO 16 UNITS INTO THE SKIN with meals, Disp: 45 mL, Rfl: 2 .  Insulin Pen Needle (UNIFINE PENTIPS) 31G X 8 MM MISC, 1 each by Other route See admin instructions., Disp: 100 each, Rfl: 5 .  Lancets (FREESTYLE) lancets, Use as instructed, Disp: 100 each, Rfl: 12 .  tadalafil (CIALIS) 10 MG tablet, Take one every 3 days, Disp: 30 tablet, Rfl: 0 .  TRESIBA FLEXTOUCH 200 UNIT/ML SOPN, INJECT 36 UNITS INTO THE SKIN DAILY., Disp: 9  mL, Rfl: 2  No Known Allergies  I personally reviewed active problem list, medication list, allergies, family history, social history, health maintenance with the patient/caregiver today.   ROS  Constitutional: Negative for fever , positive for  weight change.  Respiratory: Negative for cough and shortness of breath.   Cardiovascular: Negative for chest pain or palpitations.  Gastrointestinal: Negative for abdominal pain, no bowel changes.  Musculoskeletal: Negative for gait problem or joint swelling.  Skin: Negative for rash.  Neurological: Negative for dizziness or headache.  No other specific complaints in a complete  review of systems (except as listed in HPI above).  Objective  Vitals:   11/21/19 1340  BP: 120/84  Pulse: 81  Resp: 18  Temp: (!) 97.1 F (36.2 C)  TempSrc: Temporal  SpO2: 96%  Weight: 244 lb 8 oz (110.9 kg)  Height: 6\' 2"  (1.88 m)    Body mass index is 31.39 kg/m.  Physical Exam  Constitutional: Patient appears well-developed and well-nourished. Obese  No distress.  HEENT: head atraumatic, normocephalic, pupils equal and reactive to light Cardiovascular: Normal rate, regular rhythm and normal heart sounds.  No murmur heard. No BLE edema. Pulmonary/Chest: Effort normal and breath sounds normal. No respiratory distress. Abdominal: Soft.  There is no tenderness. Psychiatric: Patient has a normal mood and affect. behavior is normal. Judgment and thought content normal.   Diabetic Foot Exam: Diabetic Foot Exam - Simple   Simple Foot Form Diabetic Foot exam was performed with the following findings: Yes 11/21/2019  4:53 PM  Visual Inspection See comments: Yes Sensation Testing Intact to touch and monofilament testing bilaterally: Yes Pulse Check Posterior Tibialis and Dorsalis pulse intact bilaterally: Yes Comments Callus formation       PHQ2/9: Depression screen [facility] 2/9 11/21/2019 08/15/2019 05/15/2019 03/23/2019 02/12/2019  Decreased Interest 0 0 0 0 0  Down, Depressed, Hopeless 0 0 0 0 0  PHQ - 2 Score 0 0 0 0 0  Altered sleeping 0 0 0 0 0  Tired, decreased energy 0 0 0 0 0  Change in appetite 0 0 0 0 0  Feeling bad or failure about yourself  0 0 0 0 0  Trouble concentrating 0 0 0 0 0  Moving slowly or fidgety/restless 0 0 0 0 0  Suicidal thoughts 0 0 0 0 0  PHQ-9 Score 0 0 0 0 0  Difficult doing work/chores Not difficult at all Not difficult at all Not difficult at all Not difficult at all Not difficult at all  Some recent data might be hidden    phq 9 is negative   Fall Risk: Fall Risk  11/21/2019 08/15/2019 05/15/2019 03/23/2019 02/12/2019  Falls in the  past year? 0 0 0 0 0  Number falls in past yr: 0 0 0 0 0  Comment - - - - -  Injury with Fall? 0 0 0 0 0  Comment - - - - -  Follow up Falls evaluation completed - - - -     Functional Status Survey: Is the patient deaf or have difficulty hearing?: No Does the patient have difficulty seeing, even when wearing glasses/contacts?: No Does the patient have difficulty concentrating, remembering, or making decisions?: No Does the patient have difficulty walking or climbing stairs?: No Does the patient have difficulty dressing or bathing?: No Does the patient have difficulty doing errands alone such as visiting a doctor's office or shopping?: No    Assessment & Plan  1. Type 1 diabetes mellitus  with neuropathy causing erectile dysfunction (HCC)  - POCT HgB A1C  2. Insulin resistance  Needs to increase physical activity   3. Dyslipidemia  Continue Atorvastatin   4. Diabetic erectile dysfunction associated with type 1 diabetes mellitus (HCC)  Adjusting dose  - tadalafil (CIALIS) 10 MG tablet; Take one every 3 days  Dispense: 30 tablet; Refill: 0  5. Vitamin D deficiency  Still taking supplementation

## 2019-11-23 ENCOUNTER — Telehealth: Payer: Self-pay

## 2019-11-23 NOTE — Telephone Encounter (Signed)
Copied from CRM [id number]. Topic: General - Inquiry >> Nov 22, 2019  3:50 PM Floria Raveling A wrote: Reason for CRM: pt called In and as has a couple of questions about the dose on the tadalafil (CIALIS) 10 MG tablet [[id number]].  He feels he is a little confused and would like how he should be taking this .   Best number [phone]

## 2019-11-23 NOTE — Telephone Encounter (Signed)
Called to instruct patient to take medication one every 3 days. No answer when I called. LVM.

## 2019-12-19 DIAGNOSIS — E139 Other specified diabetes mellitus without complications: Secondary | ICD-10-CM | POA: Diagnosis not present

## 2019-12-19 LAB — HM DIABETES EYE EXAM

## 2019-12-20 ENCOUNTER — Encounter: Payer: Self-pay | Admitting: Family Medicine

## 2019-12-31 ENCOUNTER — Other Ambulatory Visit: Payer: Self-pay | Admitting: Family Medicine

## 2019-12-31 DIAGNOSIS — E1049 Type 1 diabetes mellitus with other diabetic neurological complication: Secondary | ICD-10-CM

## 2019-12-31 DIAGNOSIS — N521 Erectile dysfunction due to diseases classified elsewhere: Secondary | ICD-10-CM

## 2020-02-04 ENCOUNTER — Other Ambulatory Visit: Payer: Self-pay | Admitting: Family Medicine

## 2020-02-04 DIAGNOSIS — E1049 Type 1 diabetes mellitus with other diabetic neurological complication: Secondary | ICD-10-CM

## 2020-02-04 DIAGNOSIS — N521 Erectile dysfunction due to diseases classified elsewhere: Secondary | ICD-10-CM

## 2020-02-05 ENCOUNTER — Other Ambulatory Visit: Payer: Self-pay | Admitting: Family Medicine

## 2020-02-05 DIAGNOSIS — N521 Erectile dysfunction due to diseases classified elsewhere: Secondary | ICD-10-CM

## 2020-02-05 DIAGNOSIS — E1049 Type 1 diabetes mellitus with other diabetic neurological complication: Secondary | ICD-10-CM

## 2020-02-05 NOTE — Telephone Encounter (Signed)
Requested Prescriptions  Pending Prescriptions Disp Refills  . TRESIBA FLEXTOUCH 200 UNIT/ML FlexTouch Pen [Pharmacy Med Name: TRESIBA FLEXTOUCH 200 UNITS 200 Solution Pen-injector] 9 mL 2    Sig: INJECT 36 UNITS INTO THE SKIN DAILY.     Endocrinology:  Diabetes - Insulins Passed - 02/05/2020  7:24 AM      Passed - HBA1C is between 0 and 7.9 and within 180 days    Hemoglobin A1C  Date Value Ref Range Status  04/10/2012 6.2 4.2 - 6.3 % Final    Comment:    The American Diabetes Association recommends that a primary goal of therapy should be <7% and that physicians should reevaluate the treatment regimen in patients with HbA1c values consistently >8%.    HbA1c, POC (controlled diabetic range)  Date Value Ref Range Status  11/21/2019 7.9 (A) 0.0 - 7.0 % Final         Passed - Valid encounter within last 6 months    Recent Outpatient Visits          2 months ago Type 1 diabetes mellitus with neuropathy causing erectile dysfunction [facility])   [facility], [name], MD   5 months ago LADA (latent autoimmune diabetes in adults), managed as type 1 [facility])   [facility] [name], MD   8 months ago Dyslipidemia   [facility] [name], MD   10 months ago Acute non-recurrent maxillary sinusitis   [facility] Cheryle Horsfall, NP   11 months ago Type 1 diabetes mellitus with neuropathy causing erectile dysfunction [facility])   [facility] [name], MD      Future Appointments            In 2 weeks [name], MD [facility], [facility]

## 2020-02-20 ENCOUNTER — Other Ambulatory Visit: Payer: Self-pay

## 2020-02-20 ENCOUNTER — Ambulatory Visit: Payer: 59 | Admitting: Family Medicine

## 2020-02-20 ENCOUNTER — Other Ambulatory Visit: Payer: Self-pay | Admitting: Family Medicine

## 2020-02-20 ENCOUNTER — Encounter: Payer: Self-pay | Admitting: Family Medicine

## 2020-02-20 VITALS — BP 124/76 | HR 69 | Temp 97.3°F | Resp 18 | Ht 74.0 in | Wt 246.3 lb

## 2020-02-20 DIAGNOSIS — Z125 Encounter for screening for malignant neoplasm of prostate: Secondary | ICD-10-CM | POA: Diagnosis not present

## 2020-02-20 DIAGNOSIS — N521 Erectile dysfunction due to diseases classified elsewhere: Secondary | ICD-10-CM | POA: Diagnosis not present

## 2020-02-20 DIAGNOSIS — E291 Testicular hypofunction: Secondary | ICD-10-CM

## 2020-02-20 DIAGNOSIS — R350 Frequency of micturition: Secondary | ICD-10-CM

## 2020-02-20 DIAGNOSIS — E559 Vitamin D deficiency, unspecified: Secondary | ICD-10-CM

## 2020-02-20 DIAGNOSIS — E1069 Type 1 diabetes mellitus with other specified complication: Secondary | ICD-10-CM | POA: Diagnosis not present

## 2020-02-20 DIAGNOSIS — E785 Hyperlipidemia, unspecified: Secondary | ICD-10-CM

## 2020-02-20 DIAGNOSIS — Z79899 Other long term (current) drug therapy: Secondary | ICD-10-CM | POA: Diagnosis not present

## 2020-02-20 DIAGNOSIS — E1049 Type 1 diabetes mellitus with other diabetic neurological complication: Secondary | ICD-10-CM | POA: Diagnosis not present

## 2020-02-20 DIAGNOSIS — N401 Enlarged prostate with lower urinary tract symptoms: Secondary | ICD-10-CM

## 2020-02-20 LAB — POCT GLYCOSYLATED HEMOGLOBIN (HGB A1C): Hemoglobin A1C: 6.6 % — AB (ref 4.0–5.6)

## 2020-02-20 MED ORDER — ATORVASTATIN CALCIUM 20 MG PO TABS: 20.0000 mg | ORAL_TABLET | Freq: Every day | ORAL | 1 refills | Status: DC

## 2020-02-20 MED ORDER — TADALAFIL 5 MG PO TABS: 5.0000 mg | ORAL_TABLET | Freq: Every day | ORAL | 1 refills | Status: DC

## 2020-02-20 NOTE — Progress Notes (Signed)
Name: Ivan Booker   MRN: [id number]    DOB: [birth year]   Date:02/20/2020       Progress Note  Subjective  Chief Complaint  Chief Complaint  Patient presents with  . Diabetes    3 month follow up  . Hypertension    HPI  DMI with ED: he is doing well. He states glucose still drops occasionally when physically active and over shoots the insulin.He states glucose drops with activity only, he deniesepisodesof hypoglycemiawhile driving, no episodes that required assistance from another person, occasionally has hypoglycemia but he is aware of symptoms - he gets a little mental fogginess and he is able to get a snack. He is checking his glucose multiple times a day - at least 4 times aday.No polyphagia, polyuria or polydipsia. EDhe is now on Cialis. He was diagnosed with DM as an young adult ( mid-20's ) and has good knowledge of his condition.  He was snacking more on crackers and having more sandwiches and A1C was  Up to  7.9% on his last visit and average 30 days was 170's this time average at home has been 138 and today A1C was at goal at 6.6%. He states the weather is better and he has been golfing daily for hours.   BPH: used to see Urologist - Dr. Lonna Cobb - years ago, had normal biopsies in the past.He is taking Cialis for [facility] also for ED, last visit he asked to go up on Cialis to 10 mg because it was not working well, but now is doing better and is back on 5 mg daly . It improved when glucose improved   Hypogonadism: heis no longer on testosterone supplementation because insurance denied coverage, energy level is normal, he would like to have levels rechecked, he states when glucose was high his libido was down and ED was worse, glucose is improving and symptoms also better. We will recheck labs   Hyperlipidemia: at goal, taking Atorvastatin daily, no cramping or chest pain.LDL has been normal  Vitamin D deficiency: we will recheck labs, taking  supplementation  Patient Active Problem List   Diagnosis Date Noted  . Erectile dysfunction 06/26/2015  . BPH (benign prostatic hyperplasia) 03/20/2015  . 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 Dyslipidemia 03/07/2015  . Hypogonadism male 03/07/2015  . Obesity (BMI 30.0-34.9) 03/07/2015  . Allergic rhinitis, seasonal 03/07/2015  . Vitamin D deficiency 03/07/2015    Past Surgical History:  Procedure Laterality Date  . VASECTOMY  1993    Family History  Problem Relation Age of Onset  . COPD Father        Smoker     Social History   Tobacco Use  . Smoking status: Never Smoker  . Smokeless tobacco: Never Used  Substance Use Topics  . Alcohol use: Yes    Comment: 1-2 beers on weekends     Current Outpatient Medications:  .  aspirin 81 MG chewable tablet, Chew 1 tablet by mouth daily., Disp: , Rfl:  .  atorvastatin (LIPITOR) 20 MG tablet, Take 1 tablet (20 mg total) by mouth daily., Disp: 90 tablet, Rfl: 1 .  Cholecalciferol (VITAMIN D) 2000 UNITS CAPS, Take 1 capsule by mouth daily., Disp: , Rfl:  .  glucose blood (FREESTYLE LITE) test strip, CHECK FASTING BLOOD SUGARS 5 TIMES DAILY, Disp: 300 strip, Rfl: 2 .  insulin lispro (HUMALOG KWIKPEN) 100 UNIT/ML KwikPen, INJECT 5 TO 16 UNITS INTO THE SKIN WITH MEALS,  Disp: 45 mL, Rfl: 2 .  Lancets (FREESTYLE) lancets, Use as instructed, Disp: 100 each, Rfl: 12 .  tadalafil (CIALIS) 10 MG tablet, Take one every 3 days, Disp: 30 tablet, Rfl: 0 .  TRESIBA FLEXTOUCH 200 UNIT/ML FlexTouch Pen, INJECT 36 UNITS INTO THE SKIN DAILY., Disp: 9 mL, Rfl: 2 .  UNIFINE PENTIPS 31G X 8 MM MISC, USE AS DIRECTED, Disp: 100 each, Rfl: 3  No Known Allergies  I personally reviewed active problem list, medication list, allergies, family history, social history, health maintenance with the patient/caregiver today.   ROS  Constitutional: Negative for fever or weight change.  Respiratory: Negative  for cough and shortness of breath.   Cardiovascular: Negative for chest pain or palpitations.  Gastrointestinal: Negative for abdominal pain, no bowel changes.  Musculoskeletal: Negative for gait problem or joint swelling.  Skin: Negative for rash.  Neurological: Negative for dizziness or headache.  No other specific complaints in a complete review of systems (except as listed in HPI above).  Objective  Vitals:   02/20/20 1316  BP: 124/76  Pulse: 69  Resp: 18  Temp: (!) 97.3 F (36.3 C)  TempSrc: Temporal  SpO2: 95%  Weight: 246 lb 4.8 oz (111.7 kg)  Height: 6\' 2"  (1.88 m)    Body mass index is 31.62 kg/m.  Physical Exam  Constitutional: Patient appears well-developed and well-nourished. Obese  No distress.  HEENT: head atraumatic, normocephalic, pupils equal and reactive to light,  neck supple, Cardiovascular: Normal rate, regular rhythm and normal heart sounds.  No murmur heard. No BLE edema. Pulmonary/Chest: Effort normal and breath sounds normal. No respiratory distress. Abdominal: Soft.  There is no tenderness. Psychiatric: Patient has a normal mood and affect. behavior is normal. Judgment and thought content normal.  Recent Results (from the past 2160 hour(s))  HM DIABETES EYE EXAM     Status: None   Collection Time: 12/19/19 12:00 AM  Result Value Ref Range   HM Diabetic Eye Exam No Retinopathy No Retinopathy    Comment: [facility], Dr[facility]  POCT HgB A1C     Status: Abnormal   Collection Time: 02/20/20  1:22 PM  Result Value Ref Range   Hemoglobin A1C 6.6 (A) 4.0 - 5.6 %   HbA1c POC (<> result, manual entry)     HbA1c, POC (prediabetic range)     HbA1c, POC (controlled diabetic range)       PHQ2/9: Depression screen [facility] 2/9 02/20/2020 11/21/2019 08/15/2019 05/15/2019 03/23/2019  Decreased Interest 0 0 0 0 0  Down, Depressed, Hopeless 0 0 0 0 0  PHQ - 2 Score 0 0 0 0 0  Altered sleeping 0 0 0 0 0  Tired, decreased energy 0 0 0 0 0  Change in  appetite 0 0 0 0 0  Feeling bad or failure about yourself  0 0 0 0 0  Trouble concentrating 0 0 0 0 0  Moving slowly or fidgety/restless 0 0 0 0 0  Suicidal thoughts 0 0 0 0 0  PHQ-9 Score 0 0 0 0 0  Difficult doing work/chores Not difficult at all Not difficult at all Not difficult at all Not difficult at all Not difficult at all  Some recent data might be hidden    phq 9 is negative   Fall Risk: Fall Risk  02/20/2020 11/21/2019 08/15/2019 05/15/2019 03/23/2019  Falls in the past year? 0 0 0 0 0  Number falls in past yr: 0 0 0 0 0  Comment - - - - -  Injury with Fall? 0 0 0 0 0  Comment - - - - -  Follow up Falls evaluation completed Falls evaluation completed - - -     Functional Status Survey: Is the patient deaf or have difficulty hearing?: No Does the patient have difficulty seeing, even when wearing glasses/contacts?: No Does the patient have difficulty concentrating, remembering, or making decisions?: No Does the patient have difficulty walking or climbing stairs?: No Does the patient have difficulty dressing or bathing?: No Does the patient have difficulty doing errands alone such as visiting a doctor's office or shopping?: No   Assessment & Plan  1. 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)  - POCT HgB A1C - COMPLETE METABOLIC PANEL WITH GFR - Lipid panel - Microalbumin / creatinine urine ratio  2. Benign prostatic hyperplasia with urinary frequency  - PSA  3. Dyslipidemia  - atorvastatin (LIPITOR) 20 MG tablet; Take 1 tablet (20 mg total) by mouth daily.  Dispense: 90 tablet; Refill: 1  4. Vitamin D deficiency  - VITAMIN D 25 Hydroxy (Vit-D Deficiency, Fractures)  5. Prostate cancer screening  - PSA  6. Long-term use of high-risk medication  - CBC with Differential/Platelet - COMPLETE METABOLIC PANEL WITH GFR  7. Hypogonadism male  - Testosterone Total,Free,Bio, Males  8. Diabetic erectile dysfunction associated  with type 1 diabetes mellitus (HCC)  - tadalafil (CIALIS) 5 MG tablet; Take 1 tablet (5 mg total) by mouth daily. Take one every 3 days  Dispense: 90 tablet; Refill: 1

## 2020-02-21 LAB — LIPID PANEL
Cholesterol: 154 mg/dL (ref <200)
HDL: 48 mg/dL (ref >=40)
LDL Cholesterol (Calc): 83 mg/dL (calc)
Non-HDL Cholesterol (Calc): 106 mg/dL (calc) (ref <130)
Total CHOL/HDL Ratio: 3.2 (calc) (ref <5.0)
Triglycerides: 124 mg/dL (ref <150)

## 2020-02-21 LAB — CBC WITH DIFFERENTIAL/PLATELET
Absolute Monocytes: 680 cells/uL (ref 200–950)
Basophils Absolute: 89 cells/uL (ref 0–200)
Basophils Relative: 1.1 %
Eosinophils Absolute: 235 cells/uL (ref 15–500)
Eosinophils Relative: 2.9 %
HCT: 45.7 % (ref 38.5–50.0)
Hemoglobin: 15.5 g/dL (ref 13.2–17.1)
Lymphs Abs: 1823 cells/uL (ref 850–3900)
MCH: 30.6 pg (ref 27.0–33.0)
MCHC: 33.9 g/dL (ref 32.0–36.0)
MCV: 90.1 fL (ref 80.0–100.0)
MPV: 9.8 fL (ref 7.5–12.5)
Monocytes Relative: 8.4 %
Neutro Abs: 5273 cells/uL (ref 1500–7800)
Neutrophils Relative %: 65.1 %
Platelets: 193 10*3/uL (ref 140–400)
RBC: 5.07 10*6/uL (ref 4.20–5.80)
RDW: 12 % (ref 11.0–15.0)
Total Lymphocyte: 22.5 %
WBC: 8.1 10*3/uL (ref 3.8–10.8)

## 2020-02-21 LAB — COMPLETE METABOLIC PANEL WITH GFR
AG Ratio: 2 (calc) (ref 1.0–2.5)
ALT: 15 U/L (ref 9–46)
AST: 14 U/L (ref 10–35)
Albumin: 4 g/dL (ref 3.6–5.1)
Alkaline phosphatase (APISO): 89 U/L (ref 35–144)
BUN: 18 mg/dL (ref 7–25)
CO2: 29 mmol/L (ref 20–32)
Calcium: 8.9 mg/dL (ref 8.6–10.3)
Chloride: 104 mmol/L (ref 98–110)
Creat: 0.97 mg/dL (ref 0.70–1.33)
GFR, Est African American: 101 mL/min/{1.73_m2} (ref >=60)
GFR, Est Non African American: 87 mL/min/{1.73_m2} (ref >=60)
Globulin: 2 g/dL (calc) (ref 1.9–3.7)
Glucose, Bld: 216 mg/dL — ABNORMAL HIGH (ref 65–99)
Potassium: 4.4 mmol/L (ref 3.5–5.3)
Sodium: 138 mmol/L (ref 135–146)
Total Bilirubin: 0.6 mg/dL (ref 0.2–1.2)
Total Protein: 6 g/dL — ABNORMAL LOW (ref 6.1–8.1)

## 2020-02-21 LAB — MICROALBUMIN / CREATININE URINE RATIO
Creatinine, Urine: 134 mg/dL (ref 20–320)
Microalb Creat Ratio: 1 mcg/mg creat (ref <30)
Microalb, Ur: 0.2 mg/dL

## 2020-02-21 LAB — TESTOSTERONE TOTAL,FREE,BIO, MALES
Albumin: 4 g/dL (ref 3.6–5.1)
Sex Hormone Binding: 33 nmol/L (ref 22–77)
Testosterone, Bioavailable: 108.6 ng/dL — ABNORMAL LOW (ref >=110.0)
Testosterone, Free: 59 pg/mL (ref 46.0–224.0)
Testosterone: 432 ng/dL (ref 250–827)

## 2020-02-21 LAB — VITAMIN D 25 HYDROXY (VIT D DEFICIENCY, FRACTURES): Vit D, 25-Hydroxy: 34 ng/mL (ref 30–100)

## 2020-02-21 LAB — PSA: PSA: 0.4 ng/mL (ref <=4.0)

## 2020-03-20 ENCOUNTER — Other Ambulatory Visit: Payer: Self-pay | Admitting: Family Medicine

## 2020-03-20 DIAGNOSIS — N521 Erectile dysfunction due to diseases classified elsewhere: Secondary | ICD-10-CM

## 2020-03-20 NOTE — Telephone Encounter (Signed)
Patient is calling to check on the request below. Patient feels that the Cialis 5mg  is not working well. He is will to try something stronger. Please advise with the patient CB- [phone].   Preferred Pharmacy [facility] [address] [place]

## 2020-03-20 NOTE — Telephone Encounter (Signed)
tadalafil (CIALIS) 5 MG tablet Medication Date: 02/20/2020 Department: [facility] Ordering/Authorizing: [name], MD   Pt has stated that this script is not really doing much of anything for him. He was wanting to know if Dr Kathie Rhodes could prescribe something different for him, even if just a few pills to try. If he is needed for reach out [phone] otherwise call in at  [facility] - [place], [place] - [address] Phone:  [phone]  Fax:  [phone]

## 2020-03-21 ENCOUNTER — Other Ambulatory Visit: Payer: Self-pay | Admitting: Family Medicine

## 2020-03-21 MED ORDER — SILDENAFIL CITRATE 20 MG PO TABS: 20.0000 mg | ORAL_TABLET | Freq: Three times a day (TID) | ORAL | 0 refills | Status: DC

## 2020-03-21 MED ORDER — TADALAFIL 5 MG PO TABS: 5.0000 mg | ORAL_TABLET | Freq: Every day | ORAL | 1 refills | Status: DC

## 2020-03-21 NOTE — Telephone Encounter (Signed)
Pt called in this morning to follow up on his Rx request. Pt would like to pick up today if possible.   Please assist.

## 2020-03-24 ENCOUNTER — Telehealth: Payer: Self-pay | Admitting: Family Medicine

## 2020-03-24 NOTE — Telephone Encounter (Signed)
He just wants to make sure that he is taking this medication right. He is taking it as needed and not 3 times daily. Please clarify.

## 2020-03-24 NOTE — Telephone Encounter (Signed)
sildenafil (REVATIO) 20 MG tablet /  Take 1 tablet (20 mg total) by mouth 3 (three) times daily.   Patient requesting to speak with clinical staff regarding this medication. He would specifically like to discuss the directions/quantity. Please advise.

## 2020-03-25 NOTE — Telephone Encounter (Signed)
Spoke to wife in regards to how he is suppose to take the Revatio. She verbalized understanding.

## 2020-04-21 ENCOUNTER — Other Ambulatory Visit: Payer: Self-pay | Admitting: Family Medicine

## 2020-04-21 NOTE — Telephone Encounter (Signed)
Requested medication (s) are due for refill today: yes  Requested medication (s) are on the active medication list: yes  Last refill:  03/21/20 #60 0 refills  Future visit scheduled: yes  Notes to clinic:  recommended dose exceeds daily maximum. Class: print     Requested Prescriptions  Pending Prescriptions Disp Refills   sildenafil (REVATIO) 20 MG tablet 60 tablet 0    Sig: Take 1 tablet (20 mg total) by mouth 3 (three) times daily.      Urology: Erectile Dysfunction Agents Passed - 04/21/2020  3:58 PM      Passed - Last BP in normal range    BP Readings from Last 1 Encounters:  02/20/20 124/76          Passed - Valid encounter within last 12 months    Recent Outpatient Visits           2 months ago Type 1 diabetes mellitus with neuropathy causing erectile dysfunction [facility])   [facility] [name], MD   5 months ago Type 1 diabetes mellitus with neuropathy causing erectile dysfunction [facility])   [facility], [name], MD   8 months ago LADA (latent autoimmune diabetes in adults), managed as type 1 [facility])   [facility] [name], MD   11 months ago Dyslipidemia   [facility] [name], MD   1 year ago Acute non-recurrent maxillary sinusitis   [facility] Cheryle Horsfall, NP       Future Appointments             In 1 month Carlynn Purl, [name], MD [facility]

## 2020-04-21 NOTE — Telephone Encounter (Signed)
Medication Refill - Medication:  sildenafil (REVATIO) 20 MG tablet  Has the patient contacted their pharmacy? yes (Agent: If no, request that the patient contact the pharmacy for the refill.) (Agent: If yes, when and what did the pharmacy advise?)Contact PCP  Preferred Pharmacy (with phone number or street name):  [facility] [address] - [place], [place] - [address] Phone:  [phone]  Fax:  [phone]       Agent: Please be advised that RX refills may take up to 3 business days. We ask that you follow-up with your pharmacy.

## 2020-04-22 MED ORDER — SILDENAFIL CITRATE 20 MG PO TABS: 20.0000 mg | ORAL_TABLET | Freq: Three times a day (TID) | ORAL | 0 refills | Status: DC

## 2020-05-14 ENCOUNTER — Other Ambulatory Visit: Payer: Self-pay

## 2020-05-14 ENCOUNTER — Ambulatory Visit: Payer: 59 | Admitting: Family Medicine

## 2020-05-14 ENCOUNTER — Encounter: Payer: Self-pay | Admitting: Family Medicine

## 2020-05-14 VITALS — BP 132/78 | HR 78 | Temp 98.4°F | Resp 18 | Ht 74.0 in | Wt 239.9 lb

## 2020-05-14 DIAGNOSIS — N5089 Other specified disorders of the male genital organs: Secondary | ICD-10-CM | POA: Diagnosis not present

## 2020-05-14 DIAGNOSIS — N528 Other male erectile dysfunction: Secondary | ICD-10-CM

## 2020-05-14 DIAGNOSIS — N50812 Left testicular pain: Secondary | ICD-10-CM | POA: Diagnosis not present

## 2020-05-14 MED ORDER — SILDENAFIL CITRATE 20 MG PO TABS: 20.0000 mg | ORAL_TABLET | Freq: Every day | ORAL | 2 refills | Status: DC | PRN

## 2020-05-14 NOTE — Progress Notes (Signed)
Name: Ivan BailiffMarty N Booker   MRN: [id number]    DOB: Jan 16, 1966   Date:05/14/2020       Progress Note  Subjective  Chief Complaint  Chief Complaint  Patient presents with  . Testicle Pain    soreness/knot    HPI  Left testicular pain; he states he was playing golf on Sunday and accidentally jammed the club on the left testicular area, had some pain that went away quickly, this morning while driving, his phone slid towards his inner thigh and noticed the left testes being swollen and slightly tender, he is going to the beach tomorrow and is concerned about it.  No dysuria or hematuria, no rectal pain . No fever or chills. Currently just sore to touch, not with activity . He states he has not taken any medications or done anything about it    Patient Active Problem List   Diagnosis Date Noted  . Erectile dysfunction 06/26/2015  . BPH (benign prostatic hyperplasia) 03/20/2015  . 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 Dyslipidemia 03/07/2015  . Hypogonadism male 03/07/2015  . Obesity (BMI 30.0-34.9) 03/07/2015  . Allergic rhinitis, seasonal 03/07/2015  . Vitamin D deficiency 03/07/2015    Past Surgical History:  Procedure Laterality Date  . VASECTOMY  1993    Family History  Problem Relation Age of Onset  . COPD Father        Smoker     Social History   Tobacco Use  . Smoking status: Never Smoker  . Smokeless tobacco: Never Used  Substance Use Topics  . Alcohol use: Yes    Comment: 1-2 beers on weekends     Current Outpatient Medications:  .  aspirin 81 MG chewable tablet, Chew 1 tablet by mouth daily., Disp: , Rfl:  .  atorvastatin (LIPITOR) 20 MG tablet, Take 1 tablet (20 mg total) by mouth daily., Disp: 90 tablet, Rfl: 1 .  Cholecalciferol (VITAMIN D) 2000 UNITS CAPS, Take 1 capsule by mouth daily., Disp: , Rfl:  .  glucose blood (FREESTYLE LITE) test strip, CHECK FASTING BLOOD SUGARS 5 TIMES DAILY, Disp: 300 strip,  Rfl: 2 .  insulin lispro (HUMALOG KWIKPEN) 100 UNIT/ML KwikPen, INJECT 5 TO 16 UNITS INTO THE SKIN WITH MEALS, Disp: 45 mL, Rfl: 2 .  Lancets (FREESTYLE) lancets, Use as instructed, Disp: 100 each, Rfl: 12 .  sildenafil (REVATIO) 20 MG tablet, Take 1 tablet (20 mg total) by mouth 3 (three) times daily., Disp: 60 tablet, Rfl: 0 .  TRESIBA FLEXTOUCH 200 UNIT/ML FlexTouch Pen, INJECT 36 UNITS INTO THE SKIN DAILY., Disp: 9 mL, Rfl: 2 .  UNIFINE PENTIPS 31G X 8 MM MISC, USE AS DIRECTED, Disp: 100 each, Rfl: 3  No Known Allergies  I personally reviewed active problem list, medication list, allergies, family history, social history, health maintenance with the patient/caregiver today.   ROS  Constitutional: Negative for fever or weight change.  Respiratory: Negative for cough and shortness of breath.   Cardiovascular: Negative for chest pain or palpitations.  Gastrointestinal: Negative for abdominal pain, no bowel changes.  Musculoskeletal: Negative for gait problem or joint swelling.  Skin: Negative for rash.  Neurological: Negative for dizziness or headache.  No other specific complaints in a complete review of systems (except as listed in HPI above).  Objective  Vitals:   05/14/20 1450  BP: 132/78  Pulse: 78  Resp: 18  Temp: 98.4 F (36.9 C)  TempSrc: Oral  SpO2: 99%  Weight:  239 lb 14.4 oz (108.8 kg)  Height: 6\' 2"  (1.88 m)    Body mass index is 30.8 kg/m.  Physical Exam  Constitutional: Patient appears well-developed and well-nourished. Muscular  No distress.  HEENT: head atraumatic, normocephalic, pupils equal and reactive to light,neck supple Cardiovascular: Normal rate, regular rhythm and normal heart sounds.  No murmur heard. No BLE edema. Pulmonary/Chest: Effort normal and breath sounds normal. No respiratory distress. Urological: he has a round mass on right testicle, slightly tender to touch, no hernias  Abdominal: Soft.  There is no tenderness. Psychiatric:  Patient has a normal mood and affect. behavior is normal. Judgment and thought content normal.   Recent Results (from the past 2160 hour(s))  POCT HgB A1C     Status: Abnormal   Collection Time: 02/20/20  1:22 PM  Result Value Ref Range   Hemoglobin A1C 6.6 (A) 4.0 - 5.6 %   HbA1c POC (<> result, manual entry)     HbA1c, POC (prediabetic range)     HbA1c, POC (controlled diabetic range)    CBC with Differential/Platelet     Status: None   Collection Time: 02/20/20  1:54 PM  Result Value Ref Range   WBC 8.1 3.8 - 10.8 Thousand/uL   RBC 5.07 4.20 - 5.80 Million/uL   Hemoglobin 15.5 13.2 - 17.1 g/dL   HCT 02/22/20 38 - 50 %   MCV 90.1 80.0 - 100.0 fL   MCH 30.6 27.0 - 33.0 pg   MCHC 33.9 32.0 - 36.0 g/dL   RDW 70.3 50.0 - 93.8 %   Platelets 193 140 - 400 Thousand/uL   MPV 9.8 7.5 - 12.5 fL   Neutro Abs 5,273 1,500 - 7,800 cells/uL   Lymphs Abs 1,823 850 - 3,900 cells/uL   Absolute Monocytes 680 200 - 950 cells/uL   Eosinophils Absolute 235 15 - 500 cells/uL   Basophils Absolute 89 0 - 200 cells/uL   Neutrophils Relative % 65.1 %   Total Lymphocyte 22.5 %   Monocytes Relative 8.4 %   Eosinophils Relative 2.9 %   Basophils Relative 1.1 %  COMPLETE METABOLIC PANEL WITH GFR     Status: Abnormal   Collection Time: 02/20/20  1:54 PM  Result Value Ref Range   Glucose, Bld 216 (H) 65 - 99 mg/dL    Comment: .            Fasting reference interval . For someone without known diabetes, a glucose value >125 mg/dL indicates that they may have diabetes and this should be confirmed with a follow-up test. .    BUN 18 7 - 25 mg/dL   Creat 02/22/20 9.93 - 7.16 mg/dL    Comment: For patients >68 years of age, the reference limit for Creatinine is approximately 13% higher for people identified as African-American. .    GFR, Est Non African American 87 > OR = 60 mL/min/1.43m2   GFR, Est African American 101 > OR = 60 mL/min/1.3m2   BUN/Creatinine Ratio NOT APPLICABLE 6 - 22 (calc)   Sodium  138 135 - 146 mmol/L   Potassium 4.4 3.5 - 5.3 mmol/L   Chloride 104 98 - 110 mmol/L   CO2 29 20 - 32 mmol/L   Calcium 8.9 8.6 - 10.3 mg/dL   Total Protein 6.0 (L) 6.1 - 8.1 g/dL   Albumin 4.0 3.6 - 5.1 g/dL   Globulin 2.0 1.9 - 3.7 g/dL (calc)   AG Ratio 2.0 1.0 - 2.5 (calc)   Total  Bilirubin 0.6 0.2 - 1.2 mg/dL   Alkaline phosphatase (APISO) 89 35 - 144 U/L   AST 14 10 - 35 U/L   ALT 15 9 - 46 U/L  Lipid panel     Status: None   Collection Time: 02/20/20  1:54 PM  Result Value Ref Range   Cholesterol 154 <200 mg/dL   HDL 48 > OR = 40 mg/dL   Triglycerides 474 <259 mg/dL   LDL Cholesterol (Calc) 83 mg/dL (calc)    Comment: Reference range: <100 . Desirable range <100 mg/dL for primary prevention;   <70 mg/dL for patients with CHD or diabetic patients  with > or = 2 CHD risk factors. Marland Kitchen LDL-C is now calculated using the Martin-Hopkins  calculation, which is a validated novel method providing  better accuracy than the Friedewald equation in the  estimation of LDL-C.  Horald Pollen et al. Lenox Ahr. 5638;756(43): 2061-2068  (http://education.[facility].com/faq/FAQ164)    Total CHOL/HDL Ratio 3.2 <5.0 (calc)   Non-HDL Cholesterol (Calc) 106 <130 mg/dL (calc)    Comment: For patients with diabetes plus 1 major ASCVD risk  factor, treating to a non-HDL-C goal of <100 mg/dL  (LDL-C of <32 mg/dL) is considered a therapeutic  option.   PSA     Status: None   Collection Time: 02/20/20  1:54 PM  Result Value Ref Range   PSA 0.4 < OR = 4.0 ng/mL    Comment: The total PSA value from this assay system is  standardized against the WHO standard. The test  result will be approximately 20% lower when compared  to the equimolar-standardized total PSA (Beckman  Coulter). Comparison of serial PSA results should be  interpreted with this fact in mind. . This test was performed using the Siemens  chemiluminescent method. Values obtained from  different assay methods cannot be  used interchangeably. PSA levels, regardless of value, should not be interpreted as absolute evidence of the presence or absence of disease.   Microalbumin / creatinine urine ratio     Status: None   Collection Time: 02/20/20  1:54 PM  Result Value Ref Range   Creatinine, Urine 134 20 - 320 mg/dL   Microalb, Ur 0.2 mg/dL    Comment: Reference Range Not established    Microalb Creat Ratio 1 <30 mcg/mg creat    Comment: . The ADA defines abnormalities in albumin excretion as follows: Marland Kitchen Category         Result (mcg/mg creatinine) . Normal                    <30 Microalbuminuria         30-299  Clinical albuminuria   > OR = 300 . The ADA recommends that at least two of three specimens collected within a 3-6 month period be abnormal before considering a patient to be within a diagnostic category.   VITAMIN D 25 Hydroxy (Vit-D Deficiency, Fractures)     Status: None   Collection Time: 02/20/20  1:54 PM  Result Value Ref Range   Vit D, 25-Hydroxy 34 30 - 100 ng/mL    Comment: Vitamin D Status         25-OH Vitamin D: . Deficiency:                    <20 ng/mL Insufficiency:             20 - 29 ng/mL Optimal:                 >  or = 30 ng/mL . For 25-OH Vitamin D testing on patients on  D2-supplementation and patients for whom quantitation  of D2 and D3 fractions is required, the Quest[facility]ureD(TM) 25-OH VIT D, (D2,D3), LC/MS/MS is recommended: order  code 94709 (patients >29yrs). See Note 1 . Note 1 . For additional information, please refer to  http://education.[facility].com/faq/FAQ199  (This link is being provided for informational/ educational purposes only.)   Testosterone Total,Free,Bio, Males     Status: Abnormal   Collection Time: 02/20/20  1:54 PM  Result Value Ref Range   Testosterone 432 250 - 827 ng/dL   Albumin 4.0 3.6 - 5.1 g/dL   Sex Hormone Binding 33 22 - 77 nmol/L   Testosterone, Free 59.0 46.0 - 224.0 pg/mL   Testosterone, Bioavailable 108.6  (L) 110.0 - 575 ng/dL     GGE3/6: Depression screen [facility] 2/9 05/14/2020 02/20/2020 11/21/2019 08/15/2019 05/15/2019  Decreased Interest 0 0 0 0 0  Down, Depressed, Hopeless 0 0 0 0 0  PHQ - 2 Score 0 0 0 0 0  Altered sleeping 0 0 0 0 0  Tired, decreased energy 0 0 0 0 0  Change in appetite 0 0 0 0 0  Feeling bad or failure about yourself  0 0 0 0 0  Trouble concentrating 0 0 0 0 0  Moving slowly or fidgety/restless 0 0 0 0 0  Suicidal thoughts 0 0 0 0 0  PHQ-9 Score 0 0 0 0 0  Difficult doing work/chores - Not difficult at all Not difficult at all Not difficult at all Not difficult at all  Some recent data might be hidden    phq 9 is negative   Fall Risk: Fall Risk  05/14/2020 02/20/2020 11/21/2019 08/15/2019 05/15/2019  Falls in the past year? 0 0 0 0 0  Number falls in past yr: 0 0 0 0 0  Comment - - - - -  Injury with Fall? 0 0 0 0 0  Comment - - - - -  Follow up - Falls evaluation completed Falls evaluation completed - -    Functional Status Survey: Is the patient deaf or have difficulty hearing?: No Does the patient have difficulty seeing, even when wearing glasses/contacts?: No Does the patient have difficulty concentrating, remembering, or making decisions?: No Does the patient have difficulty walking or climbing stairs?: No Does the patient have difficulty dressing or bathing?: No Does the patient have difficulty doing errands alone such as visiting a doctor's office or shopping?: No    Assessment & Plan  1. Other male erectile dysfunction  - sildenafil (REVATIO) 20 MG tablet; Take 1-2 tablets (20-40 mg total) by mouth daily as needed.  Dispense: 60 tablet; Refill: 2  2. Left testicular pain  - Ambulatory referral to Urology  3. Testicular mass  - Ambulatory referral to Urology

## 2020-05-15 ENCOUNTER — Other Ambulatory Visit: Payer: Self-pay | Admitting: Family Medicine

## 2020-05-15 DIAGNOSIS — E1069 Type 1 diabetes mellitus with other specified complication: Secondary | ICD-10-CM

## 2020-05-15 DIAGNOSIS — E1049 Type 1 diabetes mellitus with other diabetic neurological complication: Secondary | ICD-10-CM

## 2020-05-26 NOTE — Progress Notes (Signed)
05/28/2020 12:03 PM   Ivan Booker April 15, 1966 [id number]  Referring provider: [name], MD [address] [place],  [place] [address] Chief Complaint  Patient presents with  . Testicular Mass    New Patient    HPI: Ivan Booker is a 54 y.o. male who presents for evaluation and management of left testicular pain and mass.   He presented to his PCP on 05/14/2020 with pain in his left testicular area. He was playing golf when he accidentally jammed the club on the left testicular area. Pain quickly went away but a few days later he noticed his left testes were swollen and slightly tender. Denied dysuria or hematuria, no rectal pain. Denied taking medication for pain. Physical exam showed a round mass on right testicle that was slightly tender to touch.   Patient is on sildenafil 20 mg for ED.   He has mild aching left testicular pain and tenderness. Denies urinary symptoms. Denies taking pain medication.   He reports that he had a vasectomy years ago.    PMH: Past Medical History:  Diagnosis Date  . Allergy   . Diabetes mellitus without complication (HCC)   . Hyperlipidemia   . Hypertension   . Hypogonadism, male     Surgical History: Past Surgical History:  Procedure Laterality Date  . VASECTOMY  1993    Home Medications:  Allergies as of 05/28/2020   No Known Allergies     Medication List       Accurate as of May 28, 2020 11:59 PM. If you have any questions, ask your nurse or doctor.        aspirin 81 MG chewable tablet Chew 1 tablet by mouth daily.   atorvastatin 20 MG tablet Commonly known as: LIPITOR Take 1 tablet (20 mg total) by mouth daily.   freestyle lancets Use as instructed   FREESTYLE LITE test strip Generic drug: glucose blood CHECK FASTING BLOOD SUGARS 5 TIMES DAILY   insulin lispro 100 UNIT/ML KwikPen Commonly known as: HumaLOG KwikPen INJECT 5 TO 16 UNITS INTO THE SKIN WITH MEALS   sildenafil 20 MG  tablet Commonly known as: REVATIO Take 1-2 tablets (20-40 mg total) by mouth daily as needed.   [name] FlexTouch 200 UNIT/ML FlexTouch Pen Generic drug: insulin degludec INJECT 36 UNITS INTO THE SKIN DAILY.   Unifine Pentips 31G X 8 MM Misc Generic drug: Insulin Pen Needle USE AS DIRECTED   Vitamin D 50 MCG (2000 UT) Caps Take 1 capsule by mouth daily.       Allergies: No Known Allergies  Family History: Family History  Problem Relation Age of Onset  . COPD Father        Smoker   . Bladder Cancer Neg Hx   . Kidney cancer Neg Hx   . Prostate cancer Neg Hx     Social History:  reports that he has never smoked. He has never used smokeless tobacco. He reports current alcohol use. He reports that he does not use drugs.   Physical Exam: BP 132/78   Pulse 69   Ht 6\' 2"  (1.88 m)   Wt 240 lb (108.9 kg)   BMI 30.81 kg/m   Constitutional:  Alert and oriented, No acute distress. HEENT: [place] AT, moist mucus membranes.  Trachea midline, no masses. Cardiovascular: No clubbing, cyanosis, or edema. Respiratory: Normal respiratory effort, no increased work of breathing. GI: Abdomen is soft, nontender, nondistended, no abdominal masses GU: No CVA tenderness.  No  bladder fullness or masses.  Patient with circumcised phallus. Urethral meatus is patent.  No penile discharge. No penile lesions or rashes. Scrotum without lesions, cysts, rashes and/or edema.  Testicles are located scrotally bilaterally. No masses are appreciated in the testicles. Left epididymis is normal. Prominent right epididymis with probable cyst in head, tail, and body.  Lymph: No inguinal lymphadenopathy. Skin: No rashes, bruises or suspicious lesions. Neurologic: Grossly intact, no focal deficits, moving all 4 extremities. Psychiatric: Normal mood and affect.  Laboratory Data:  Lab Results  Component Value Date   CREATININE 0.97 02/20/2020    Lab Results  Component Value Date   PSA 0.4 02/20/2020   PSA 0.6  02/12/2019   PSA 0.5 01/26/2018    Lab Results  Component Value Date   TESTOSTERONE 432 02/20/2020    Lab Results  Component Value Date   HGBA1C 6.6 (A) 02/20/2020     Assessment & Plan:    1. Left scrotal pain This is injury related.  Improving Continue supportive care  2. Right testicular mass Based on physical exam findings stated above-prominent epididymis with possible cystic structures throughout head tail and body No appreciable intratesticular mass Scrotal ultrasound for reassurance.  Will call with results.     [facility] [address], [address] [place], [place] [address] [phone]  I, Theador Hawthorne, am acting as a scribe for Dr. Vanna Scotland.  I have reviewed the above documentation for accuracy and completeness, and I agree with the above.   Vanna Scotland, MD

## 2020-05-28 ENCOUNTER — Other Ambulatory Visit: Payer: Self-pay

## 2020-05-28 ENCOUNTER — Encounter: Payer: Self-pay | Admitting: Urology

## 2020-05-28 ENCOUNTER — Ambulatory Visit (INDEPENDENT_AMBULATORY_CARE_PROVIDER_SITE_OTHER): Payer: 59 | Admitting: Urology

## 2020-05-28 VITALS — BP 132/78 | HR 69 | Ht 74.0 in | Wt 240.0 lb

## 2020-05-28 DIAGNOSIS — N5089 Other specified disorders of the male genital organs: Secondary | ICD-10-CM | POA: Diagnosis not present

## 2020-05-28 DIAGNOSIS — N50812 Left testicular pain: Secondary | ICD-10-CM

## 2020-05-29 LAB — MICROSCOPIC EXAMINATION: Bacteria, UA: NONE SEEN

## 2020-05-29 LAB — URINALYSIS, COMPLETE
Bilirubin, UA: NEGATIVE
Glucose, UA: NEGATIVE
Ketones, UA: NEGATIVE
Leukocytes,UA: NEGATIVE
Nitrite, UA: NEGATIVE
Protein,UA: NEGATIVE
RBC, UA: NEGATIVE
Specific Gravity, UA: 1.025 (ref 1.005–1.030)
Urobilinogen, Ur: 1 mg/dL (ref 0.2–1.0)
pH, UA: 7 (ref 5.0–7.5)

## 2020-06-04 ENCOUNTER — Ambulatory Visit: Payer: 59 | Admitting: Family Medicine

## 2020-06-24 ENCOUNTER — Ambulatory Visit
Admission: RE | Admit: 2020-06-24 | Discharge: 2020-06-24 | Disposition: A | Discharge status: Home / Self Care | Payer: 59 | Source: Ambulatory Visit | Attending: Urology | Admitting: Urology

## 2020-06-24 ENCOUNTER — Other Ambulatory Visit: Payer: Self-pay

## 2020-06-24 DIAGNOSIS — N5089 Other specified disorders of the male genital organs: Secondary | ICD-10-CM | POA: Diagnosis not present

## 2020-06-24 DIAGNOSIS — N433 Hydrocele, unspecified: Secondary | ICD-10-CM | POA: Diagnosis not present

## 2020-06-24 DIAGNOSIS — N503 Cyst of epididymis: Secondary | ICD-10-CM | POA: Diagnosis not present

## 2020-06-25 ENCOUNTER — Telehealth: Payer: Self-pay

## 2020-06-25 NOTE — Telephone Encounter (Signed)
-----   Message from Vanna Scotland, MD sent at 06/25/2020  8:31 AM EDT ----- Please let this patient know that I reviewed his ultrasound.  On both sides, his epididymis has small cyst and are somewhat dilated.  This is likely a result of his previous vasectomy and is a benign/nondangerous or concerning finding.  No further evaluation or treatment will be needed.  Great news.  Vanna Scotland, MD

## 2020-06-25 NOTE — Telephone Encounter (Signed)
-----   Message from Ashley Brandon, MD sent at 06/25/2020  8:31 AM EDT ----- Please let this patient know that I reviewed his ultrasound.  On both sides, his epididymis has small cyst and are somewhat dilated.  This is likely a result of his previous vasectomy and is a benign/nondangerous or concerning finding.  No further evaluation or treatment will be needed.  Great news.  Ashley Brandon, MD  

## 2020-06-25 NOTE — Telephone Encounter (Signed)
Pt aware of results 

## 2020-07-21 ENCOUNTER — Other Ambulatory Visit: Payer: Self-pay | Admitting: Family Medicine

## 2020-07-21 DIAGNOSIS — E1049 Type 1 diabetes mellitus with other diabetic neurological complication: Secondary | ICD-10-CM

## 2020-07-21 NOTE — Telephone Encounter (Signed)
Requested Prescriptions  Pending Prescriptions Disp Refills   TRESIBA FLEXTOUCH 200 UNIT/ML FlexTouch Pen [Pharmacy Med Name: TRESIBA FLEXTOUCH 200 UNITS 200 Solution Pen-injector] 9 mL 2    Sig: INJECT 36 UNITS INTO THE SKIN DAILY.     Endocrinology:  Diabetes - Insulins Passed - 07/21/2020  7:38 AM      Passed - HBA1C is between 0 and 7.9 and within 180 days    Hemoglobin A1C  Date Value Ref Range Status  02/20/2020 6.6 (A) 4.0 - 5.6 % Final  04/10/2012 6.2 4.2 - 6.3 % Final    Comment:    The American Diabetes Association recommends that a primary goal of therapy should be <7% and that physicians should reevaluate the treatment regimen in patients with HbA1c values consistently >8%.    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)   [facility] [name], MD   11 months ago LADA (latent autoimmune diabetes in adults), managed as type 1 [facility])   [facility] [name], MD   1 year ago Dyslipidemia   [facility] [name], MD      Future Appointments            In 1 week [name], MD [facility], [facility]

## 2020-07-28 ENCOUNTER — Other Ambulatory Visit: Payer: Self-pay | Admitting: Family Medicine

## 2020-07-28 ENCOUNTER — Encounter: Payer: Self-pay | Admitting: Family Medicine

## 2020-07-28 ENCOUNTER — Other Ambulatory Visit: Payer: Self-pay

## 2020-07-28 ENCOUNTER — Ambulatory Visit: Payer: 59 | Admitting: Family Medicine

## 2020-07-28 VITALS — BP 132/86 | HR 70 | Temp 97.9°F | Resp 16 | Ht 74.0 in | Wt 236.7 lb

## 2020-07-28 DIAGNOSIS — E559 Vitamin D deficiency, unspecified: Secondary | ICD-10-CM

## 2020-07-28 DIAGNOSIS — E785 Hyperlipidemia, unspecified: Secondary | ICD-10-CM | POA: Diagnosis not present

## 2020-07-28 DIAGNOSIS — R1032 Left lower quadrant pain: Secondary | ICD-10-CM

## 2020-07-28 DIAGNOSIS — E1049 Type 1 diabetes mellitus with other diabetic neurological complication: Secondary | ICD-10-CM

## 2020-07-28 DIAGNOSIS — M7701 Medial epicondylitis, right elbow: Secondary | ICD-10-CM

## 2020-07-28 DIAGNOSIS — N521 Erectile dysfunction due to diseases classified elsewhere: Secondary | ICD-10-CM | POA: Diagnosis not present

## 2020-07-28 DIAGNOSIS — N401 Enlarged prostate with lower urinary tract symptoms: Secondary | ICD-10-CM

## 2020-07-28 DIAGNOSIS — E139 Other specified diabetes mellitus without complications: Secondary | ICD-10-CM

## 2020-07-28 DIAGNOSIS — E291 Testicular hypofunction: Secondary | ICD-10-CM | POA: Diagnosis not present

## 2020-07-28 DIAGNOSIS — E1069 Type 1 diabetes mellitus with other specified complication: Secondary | ICD-10-CM | POA: Diagnosis not present

## 2020-07-28 DIAGNOSIS — R350 Frequency of micturition: Secondary | ICD-10-CM

## 2020-07-28 LAB — POCT GLYCOSYLATED HEMOGLOBIN (HGB A1C): Hemoglobin A1C: 6.6 % — AB (ref 4.0–5.6)

## 2020-07-28 MED ORDER — ATORVASTATIN CALCIUM 40 MG PO TABS: 40.0000 mg | ORAL_TABLET | Freq: Every day | ORAL | 1 refills | Status: DC

## 2020-07-28 MED ORDER — ATORVASTATIN CALCIUM 20 MG PO TABS: 20.0000 mg | ORAL_TABLET | Freq: Every day | ORAL | 1 refills | Status: DC

## 2020-07-28 MED ORDER — GVOKE HYPOPEN 1-PACK 1 MG/0.2ML ~~LOC~~ SOAJ: 1.0000 mg | Freq: Every day | SUBCUTANEOUS | 1 refills | Status: AC | PRN

## 2020-07-28 MED ORDER — MELOXICAM 15 MG PO TABS: 15.0000 mg | ORAL_TABLET | Freq: Every day | ORAL | 0 refills | Status: DC

## 2020-07-28 NOTE — Progress Notes (Signed)
Name: Ivan Booker   MRN: [id number]    DOB: 1966-04-01   Date:07/28/2020       Progress Note  Subjective  Chief Complaint  Follow up  HPI   DMI with ED: he is doing well, no recent hypoglycemic episodes, usually has a drop in the 50's during activity when he does not decrease dose of pre-meal insulin prior to activity.He states glucose drops with activity only, he deniesepisodesof hypoglycemiawhile driving, no episodes that required assistance from another person, occasionally has hypoglycemia but he is aware of symptoms - he gets a little mental fogginess and he is able to get a snack. He is checking his glucose multiple times a day - at least 4 times aday.No polyphagia, polyuria or polydipsia. EDhe is back Revatio.He was diagnosed with DM as an young adult ( mid-20's ) and has good knowledge of his condition.  A1C has been controlled back in April at 6.6%Today at 6.6% , 30 day average 137 .   BPH: used to see Urologist - Dr. Lonna Cobb - years ago, had normal biopsies in the past.He was on Cialis but stopped working, taking sildenafil and is doing well now . He also saw Dr. Apolinar Junes for swelling on testis and diagnosed small epididymal head cysts   Hypogonadism: heis no longer on testosterone supplementation because insurance denied coverage, energy level is normal, last level was fine, doing well   Hyperlipidemia: at goal, taking Atorvastatin daily, no cramping or chest pain.LDL was 83 last time Discussed going up on dose to 40 mg daily   Vitamin D deficiency: we will recheck labs, taking supplementation  Groin pain: he states he got up one morning and developed a pulling sensation on left groin, he states stopped playing golf - last week, pain improved, but last night he moves his leg sideways and felt the pain again, he state it is not constant, lasts a few seconds and resolves. Triggered by movement, does not affect his gait. Not taking medication  Golfer's elbow: he plays  golf about 5 days a week, states since took a break playing golf pain decrease, he has swelling at times, sometimes it is a burning sensation.   Patient Active Problem List   Diagnosis Date Noted  . Erectile dysfunction 06/26/2015  . BPH (benign prostatic hyperplasia) 03/20/2015  . 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 Dyslipidemia 03/07/2015  . Hypogonadism male 03/07/2015  . Obesity (BMI 30.0-34.9) 03/07/2015  . Allergic rhinitis, seasonal 03/07/2015  . Vitamin D deficiency 03/07/2015    Past Surgical History:  Procedure Laterality Date  . VASECTOMY  1993    Family History  Problem Relation Age of Onset  . COPD Father        Smoker   . Bladder Cancer Neg Hx   . Kidney cancer Neg Hx   . Prostate cancer Neg Hx     Social History   Tobacco Use  . Smoking status: Never Smoker  . Smokeless tobacco: Never Used  Substance Use Topics  . Alcohol use: Yes    Comment: 1-2 beers on weekends     Current Outpatient Medications:  .  aspirin 81 MG chewable tablet, Chew 1 tablet by mouth daily., Disp: , Rfl:  .  atorvastatin (LIPITOR) 20 MG tablet, Take 1 tablet (20 mg total) by mouth daily., Disp: 90 tablet, Rfl: 1 .  Cholecalciferol (VITAMIN D) 2000 UNITS CAPS, Take 1 capsule by mouth daily., Disp: , Rfl:  .  FREESTYLE LITE test strip, CHECK FASTING BLOOD SUGARS 5 TIMES DAILY, Disp: 300 strip, Rfl: 2 .  glucose blood test strip, , Disp: , Rfl:  .  insulin lispro (HUMALOG KWIKPEN) 100 UNIT/ML KwikPen, INJECT 5 TO 16 UNITS INTO THE SKIN WITH MEALS, Disp: 45 mL, Rfl: 2 .  Lancets (FREESTYLE) lancets, Use as instructed, Disp: 100 each, Rfl: 12 .  sildenafil (REVATIO) 20 MG tablet, Take 1-2 tablets (20-40 mg total) by mouth daily as needed., Disp: 60 tablet, Rfl: 2 .  TRESIBA FLEXTOUCH 200 UNIT/ML FlexTouch Pen, INJECT 36 UNITS INTO THE SKIN DAILY., Disp: 9 mL, Rfl: 2 .  UNIFINE PENTIPS 31G X 8 MM MISC, USE AS DIRECTED, Disp: 100  each, Rfl: 3  No Known Allergies  I personally reviewed active problem list, medication list, allergies, family history, social history, health maintenance with the patient/caregiver today.   ROS  Constitutional: Negative for fever or weight change.  Respiratory: Negative for cough and shortness of breath.   Cardiovascular: Negative for chest pain or palpitations.  Gastrointestinal: Negative for abdominal pain, no bowel changes.  Musculoskeletal: Negative for gait problem or joint swelling.  Skin: Negative for rash.  Neurological: Negative for dizziness or headache.  No other specific complaints in a complete review of systems (except as listed in HPI above).  Objective  Vitals:   07/28/20 1322  BP: 132/86  Pulse: 70  Resp: 16  Temp: 97.9 F (36.6 C)  TempSrc: Oral  SpO2: 99%  Weight: 236 lb 11.2 oz (107.4 kg)  Height: 6\' 2"  (1.88 m)    Body mass index is 30.39 kg/m.  Physical Exam  Constitutional: Patient appears well-developed and well-nourished. Obese No distress.  HEENT: head atraumatic, normocephalic, pupils equal and reactive to light,  neck supple Cardiovascular: Normal rate, regular rhythm and normal heart sounds.  No murmur heard. No BLE edema. Pulmonary/Chest: Effort normal and breath sounds normal. No respiratory distress. Abdominal: Soft.  There is no tenderness. Psychiatric: Patient has a normal mood and affect. behavior is normal. Judgment and thought content normal. Muscular skeletal: tender during palpation of lateral epicondyle, normal left hip exam during office visit   Recent Results (from the past 2160 hour(s))  Urinalysis, Complete     Status: None   Collection Time: 05/28/20  3:06 PM  Result Value Ref Range   Specific Gravity, UA 1.025 1.005 - 1.030   pH, UA 7.0 5.0 - 7.5   Color, UA Yellow Yellow   Appearance Ur Clear Clear   Leukocytes,UA Negative Negative   Protein,UA Negative Negative/Trace   Glucose, UA Negative Negative   Ketones,  UA Negative Negative   RBC, UA Negative Negative   Bilirubin, UA Negative Negative   Urobilinogen, Ur 1.0 0.2 - 1.0 mg/dL   Nitrite, UA Negative Negative   Microscopic Examination See below:   Microscopic Examination     Status: Abnormal   Collection Time: 05/28/20  3:06 PM   Urine  Result Value Ref Range   WBC, UA 0-5 0 - 5 /hpf   RBC 0-2 0 - 2 /hpf   Epithelial Cells (non renal) 0-10 0 - 10 /hpf   Casts Present (A) None seen /lpf   Cast Type Hyaline casts N/A   Bacteria, UA None seen None seen/Few  POCT HgB A1C     Status: Abnormal   Collection Time: 07/28/20  1:25 PM  Result Value Ref Range   Hemoglobin A1C 6.6 (A) 4.0 - 5.6 %   HbA1c POC (<> result,  manual entry)     HbA1c, POC (prediabetic range)     HbA1c, POC (controlled diabetic range)        PHQ2/9: Depression screen [facility] 2/9 07/28/2020 05/14/2020 02/20/2020 11/21/2019 08/15/2019  Decreased Interest 0 0 0 0 0  Down, Depressed, Hopeless 0 0 0 0 0  PHQ - 2 Score 0 0 0 0 0  Altered sleeping - 0 0 0 0  Tired, decreased energy - 0 0 0 0  Change in appetite - 0 0 0 0  Feeling bad or failure about yourself  - 0 0 0 0  Trouble concentrating - 0 0 0 0  Moving slowly or fidgety/restless - 0 0 0 0  Suicidal thoughts - 0 0 0 0  PHQ-9 Score - 0 0 0 0  Difficult doing work/chores - - Not difficult at all Not difficult at all Not difficult at all  Some recent data might be hidden    phq 9 is negative   Fall Risk: Fall Risk  07/28/2020 05/14/2020 02/20/2020 11/21/2019 08/15/2019  Falls in the past year? 0 0 0 0 0  Number falls in past yr: 0 0 0 0 0  Comment - - - - -  Injury with Fall? 0 0 0 0 0  Comment - - - - -  Follow up - - Falls evaluation completed Falls evaluation completed -     Functional Status Survey: Is the patient deaf or have difficulty hearing?: No Does the patient have difficulty seeing, even when wearing glasses/contacts?: No Does the patient have difficulty concentrating, remembering, or making  decisions?: No Does the patient have difficulty walking or climbing stairs?: No Does the patient have difficulty dressing or bathing?: No Does the patient have difficulty doing errands alone such as visiting a doctor's office or shopping?: No    Assessment & Plan  1. 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)  - POCT HgB A1C - Glucagon (GVOKE HYPOPEN 1-PACK) 1 MG/0.2ML SOAJ; Inject 1 mg into the skin daily as needed. May repeat in 15 minutes  Dispense: 0.4 mL; Refill: 1  2. Benign prostatic hyperplasia with urinary frequency   3. Dyslipidemia  - atorvastatin (LIPITOR) 40 MG tablet; Take 1 tablet (40 mg total) by mouth daily.  Dispense: 90 tablet; Refill: 1  4. Vitamin D deficiency   5. Hypogonadism male   6. Diabetic erectile dysfunction associated with type 1 diabetes mellitus (HCC)   7. LADA (latent autoimmune diabetes in adults), managed as type 1 (HCC)   8. Groin pain, left  - meloxicam (MOBIC) 15 MG tablet; Take 1 tablet (15 mg total) by mouth daily.  Dispense: 30 tablet; Refill: 0  9. Golfer's elbow, right  - meloxicam (MOBIC) 15 MG tablet; Take 1 tablet (15 mg total) by mouth daily.  Dispense: 30 tablet; Refill: 0

## 2020-08-19 ENCOUNTER — Other Ambulatory Visit: Payer: Self-pay | Admitting: Family Medicine

## 2020-08-19 DIAGNOSIS — N528 Other male erectile dysfunction: Secondary | ICD-10-CM

## 2020-08-19 MED ORDER — SILDENAFIL CITRATE 20 MG PO TABS: 20.0000 mg | ORAL_TABLET | Freq: Every day | ORAL | 2 refills | Status: DC | PRN

## 2020-08-19 NOTE — Telephone Encounter (Signed)
Medication: sildenafil (REVATIO) 20 MG tablet [[id number]]   Has the patient contacted their pharmacy?YES  (Agent: If no, request that the patient contact the pharmacy for the refill.) (Agent: If yes, when and what did the pharmacy advise?)  Preferred Pharmacy (with phone number or street name): [facility] [address] - [place], [place] - [address] [facility] [address] [place] [address] Phone: [phone] Fax: [phone] Hours: Not open 24 hours    Agent: Please be advised that RX refills may take up to 3 business days. We ask that you follow-up with your pharmacy.

## 2020-09-17 ENCOUNTER — Other Ambulatory Visit: Payer: Self-pay | Admitting: Family Medicine

## 2020-09-17 DIAGNOSIS — N528 Other male erectile dysfunction: Secondary | ICD-10-CM

## 2020-09-17 NOTE — Telephone Encounter (Signed)
Medication:  sildenafil (REVATIO) 20 MG tablet [[id number]]   Has the patient contacted their pharmacy? No (Agent: If no, request that the patient contact the pharmacy for the refill.)   Preferred Pharmacy (with phone number or street name):  [facility] [address] - [place], [place] - [address]  [address], [place] [address]  Phone:  [phone] Fax:  [phone]  Agent: Please be advised that RX refills may take up to 3 business days. We ask that you follow-up with your pharmacy.

## 2020-10-01 ENCOUNTER — Other Ambulatory Visit: Payer: Self-pay | Admitting: Family Medicine

## 2020-10-01 DIAGNOSIS — E1049 Type 1 diabetes mellitus with other diabetic neurological complication: Secondary | ICD-10-CM

## 2020-10-31 NOTE — Progress Notes (Addendum)
Name: Ivan Booker   MRN: [id number]    DOB: December 22, 1965   Date:11/03/2020       Progress Note  Subjective  Chief Complaint  Follow Up  HPI  DMI with ED: he is doing well,A1C is at goal today at 6.4 %, he had one low glucose earlier this morning in the  Mid 55's , drank orange juice and was fine after that . He states he was at the driving range for 2 hours yesterday and did not have enough calories. .He states glucose drops with activity only intermittent, he deniesepisodesof hypoglycemiawhile driving, no episodes that required assistance from another person, occasionally has hypoglycemia but he is aware of symptoms - he gets a little mental fogginess and he is able to get a snack. He is checking his glucose multiple times a day - at least 4 times a day.No polyphagia, polyuria or polydipsia. EDhe is back Revatio.He was diagnosed with DM as an young adult ( mid-20's ) and has good knowledge of his condition.   BPH: used to see Urologist - Dr. Lonna Cobb - years ago, had normal biopsies in the past.He was on Cialis but stopped working, taking sildenafil and is doing well now - he gets it from [employer] through [employer] . He also saw Dr. Apolinar Junes for swelling on testis and diagnosed small epididymal head cysts   Hypogonadism: heis no longer on testosterone supplementation because insurance denied coverage, energy level is normal, last level was fine, doing well   Hyperlipidemia: at goal, taking Atorvastatin daily, no cramping or chest pain.LDL was 83 last time , he is on higher dose of Atorvastatin and tolerating it well, we will recheck labs next visit   Vitamin D deficiency: continue otc supplementation   Golfer's elbow: he still plays golf, but no longer daily and elbow not bothering as much   Patient Active Problem List   Diagnosis Date Noted  . Erectile dysfunction 06/26/2015  . BPH (benign prostatic hyperplasia) 03/20/2015  . Type 1 diabetes mellitus with neuropathy causing  erectile dysfunction (HCC) 03/07/2015  . Dyslipidemia 03/07/2015  . Hypogonadism male 03/07/2015  . Obesity (BMI 30.0-34.9) 03/07/2015  . Allergic rhinitis, seasonal 03/07/2015  . Vitamin D deficiency 03/07/2015    Past Surgical History:  Procedure Laterality Date  . VASECTOMY  1993    Family History  Problem Relation Age of Onset  . COPD Father        Smoker   . Bladder Cancer Neg Hx   . Kidney cancer Neg Hx   . Prostate cancer Neg Hx     Social History   Tobacco Use  . Smoking status: Never Smoker  . Smokeless tobacco: Never Used  Substance Use Topics  . Alcohol use: Yes    Comment: 1-2 beers on weekends     Current Outpatient Medications:  .  aspirin 81 MG chewable tablet, Chew 1 tablet by mouth daily., Disp: , Rfl:  .  atorvastatin (LIPITOR) 40 MG tablet, Take 1 tablet (40 mg total) by mouth daily., Disp: 90 tablet, Rfl: 1 .  Cholecalciferol (VITAMIN D) 2000 UNITS CAPS, Take 1 capsule by mouth daily., Disp: , Rfl:  .  FREESTYLE LITE test strip, CHECK FASTING BLOOD SUGARS 5 TIMES DAILY, Disp: 300 strip, Rfl: 2 .  Glucagon (GVOKE HYPOPEN 1-PACK) 1 MG/0.2ML SOAJ, Inject 1 mg into the skin daily as needed. May repeat in 15 minutes, Disp: 0.4 mL, Rfl: 1 .  glucose blood test strip, , Disp: , Rfl:  .  insulin lispro (HUMALOG KWIKPEN) 100 UNIT/ML KwikPen, INJECT 5 TO 16 UNITS INTO THE SKIN WITH MEALS, Disp: 45 mL, Rfl: 2 .  Lancets (FREESTYLE) lancets, Use as instructed, Disp: 100 each, Rfl: 12 .  meloxicam (MOBIC) 15 MG tablet, Take 1 tablet (15 mg total) by mouth daily., Disp: 30 tablet, Rfl: 0 .  sildenafil (REVATIO) 20 MG tablet, Take 1-2 tablets (20-40 mg total) by mouth daily as needed., Disp: 60 tablet, Rfl: 2 .  TRESIBA FLEXTOUCH 200 UNIT/ML FlexTouch Pen, INJECT 36 UNITS INTO THE SKIN DAILY., Disp: 9 mL, Rfl: 2 .  UNIFINE PENTIPS 31G X 8 MM MISC, USE AS DIRECTED, Disp: 100 each, Rfl: 3  No Known Allergies  I personally reviewed active problem list, medication  list, allergies, family history, social history, health maintenance with the patient/caregiver today.   ROS  Constitutional: Negative for fever or weight change.  Respiratory: Negative for cough and shortness of breath.   Cardiovascular: Negative for chest pain or palpitations.  Gastrointestinal: Negative for abdominal pain, no bowel changes.  Musculoskeletal: Negative for gait problem or joint swelling.  Skin: Negative for rash.  Neurological: Negative for dizziness or headache.  No other specific complaints in a complete review of systems (except as listed in HPI above).  Objective  Vitals:   11/03/20 1328  BP: 122/64  Pulse: 74  Resp: 16  Temp: 98.2 F (36.8 C)  TempSrc: Oral  SpO2: 97%  Weight: 238 lb 6.4 oz (108.1 kg)  Height: 6\' 2"  (1.88 m)    Body mass index is 30.61 kg/m.  Physical Exam  Constitutional: Patient appears well-developed and well-nourished. Obese  No distress.  HEENT: head atraumatic, normocephalic, pupils equal and reactive to light, neck supple Cardiovascular: Normal rate, regular rhythm and normal heart sounds.  No murmur heard. No BLE edema. Pulmonary/Chest: Effort normal and breath sounds normal. No respiratory distress. Abdominal: Soft.  There is no tenderness. Psychiatric: Patient has a normal mood and affect. behavior is normal. Judgment and thought content normal.  Diabetic Foot Exam: Diabetic Foot Exam - Simple   Simple Foot Form Diabetic Foot exam was performed with the following findings: Yes 11/03/2020  1:49 PM  Visual Inspection No deformities, no ulcerations, no other skin breakdown bilaterally: Yes Sensation Testing Intact to touch and monofilament testing bilaterally: Yes Pulse Check Posterior Tibialis and Dorsalis pulse intact bilaterally: Yes Comments      PHQ2/9: Depression screen [facility] 2/9 11/03/2020 07/28/2020 05/14/2020 02/20/2020 11/21/2019  Decreased Interest 0 0 0 0 0  Down, Depressed, Hopeless 0 0 0 0 0  PHQ - 2 Score 0  0 0 0 0  Altered sleeping - - 0 0 0  Tired, decreased energy - - 0 0 0  Change in appetite - - 0 0 0  Feeling bad or failure about yourself  - - 0 0 0  Trouble concentrating - - 0 0 0  Moving slowly or fidgety/restless - - 0 0 0  Suicidal thoughts - - 0 0 0  PHQ-9 Score - - 0 0 0  Difficult doing work/chores - - - Not difficult at all Not difficult at all  Some recent data might be hidden    phq 9 is negative   Fall Risk: Fall Risk  11/03/2020 07/28/2020 05/14/2020 02/20/2020 11/21/2019  Falls in the past year? 0 0 0 0 0  Number falls in past yr: 0 0 0 0 0  Comment - - - - -  Injury with Fall? 0 0 0  0 0  Comment - - - - -  Follow up - - - Falls evaluation completed Falls evaluation completed      Assessment & Plan  1. 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 Vitamin D deficiency  Continue supplementation   3. Benign prostatic hyperplasia with urinary frequency  Stable   4. Hypogonadism male  No longer on medication, doing well on generic viagra   5. Golfer's elbow, right  Stable   6. Dyslipidemia  Continue statin

## 2020-11-03 ENCOUNTER — Ambulatory Visit (INDEPENDENT_AMBULATORY_CARE_PROVIDER_SITE_OTHER): Payer: No Typology Code available for payment source | Admitting: Family Medicine

## 2020-11-03 ENCOUNTER — Other Ambulatory Visit: Payer: Self-pay

## 2020-11-03 ENCOUNTER — Encounter: Payer: Self-pay | Admitting: Family Medicine

## 2020-11-03 VITALS — BP 122/64 | HR 74 | Temp 98.2°F | Resp 16 | Ht 74.0 in | Wt 238.4 lb

## 2020-11-03 DIAGNOSIS — N401 Enlarged prostate with lower urinary tract symptoms: Secondary | ICD-10-CM | POA: Diagnosis not present

## 2020-11-03 DIAGNOSIS — E291 Testicular hypofunction: Secondary | ICD-10-CM | POA: Diagnosis not present

## 2020-11-03 DIAGNOSIS — N521 Erectile dysfunction due to diseases classified elsewhere: Secondary | ICD-10-CM | POA: Diagnosis not present

## 2020-11-03 DIAGNOSIS — E559 Vitamin D deficiency, unspecified: Secondary | ICD-10-CM

## 2020-11-03 DIAGNOSIS — E785 Hyperlipidemia, unspecified: Secondary | ICD-10-CM

## 2020-11-03 DIAGNOSIS — R350 Frequency of micturition: Secondary | ICD-10-CM

## 2020-11-03 DIAGNOSIS — E1049 Type 1 diabetes mellitus with other diabetic neurological complication: Secondary | ICD-10-CM

## 2020-11-03 DIAGNOSIS — M7701 Medial epicondylitis, right elbow: Secondary | ICD-10-CM

## 2020-11-03 LAB — POCT GLYCOSYLATED HEMOGLOBIN (HGB A1C): Hemoglobin A1C: 6.4 % — AB (ref 4.0–5.6)

## 2020-11-03 NOTE — Addendum Note (Signed)
Addended by: [name] F on: 11/03/2020 01:53 PM   Modules accepted: Orders

## 2020-11-14 ENCOUNTER — Other Ambulatory Visit: Payer: Self-pay | Admitting: Family Medicine

## 2020-11-14 DIAGNOSIS — E1069 Type 1 diabetes mellitus with other specified complication: Secondary | ICD-10-CM

## 2020-11-14 DIAGNOSIS — E1049 Type 1 diabetes mellitus with other diabetic neurological complication: Secondary | ICD-10-CM

## 2020-11-26 ENCOUNTER — Other Ambulatory Visit: Payer: Self-pay | Admitting: Family Medicine

## 2020-11-26 DIAGNOSIS — N528 Other male erectile dysfunction: Secondary | ICD-10-CM

## 2020-11-26 MED ORDER — SILDENAFIL CITRATE 20 MG PO TABS: 20.0000 mg | ORAL_TABLET | Freq: Every day | ORAL | 2 refills | Status: DC | PRN

## 2020-11-26 NOTE — Telephone Encounter (Signed)
Medication Refill - Medication: sildenafil (REVATIO) 20 MG tablet    Has the patient contacted their pharmacy? Yes.   (Agent: If no, request that the patient contact the pharmacy for the refill.) (Agent: If yes, when and what did the pharmacy advise?) No refills   Preferred Pharmacy (with phone number or street name): [facility] [address] - [place], [place] - [address]  [address] [place] [address]  Phone:  [phone] Fax:  [phone]   Agent: Please be advised that RX refills may take up to 3 business days. We ask that you follow-up with your pharmacy.

## 2020-12-07 IMAGING — US US SCROTUM W/ DOPPLER COMPLETE
1 series · 13 of 25 positions shown · non-contrast
Comparison: None

CLINICAL DATA: RIGHT extra testicular mass; history of a sec to me

EXAM:
SCROTAL ULTRASOUND
DOPPLER ULTRASOUND OF THE TESTICLES
TECHNIQUE: Complete ultrasound examination of the testicles, epididymis, and
other scrotal structures was performed. Color and spectral Doppler
ultrasound were also utilized to evaluate blood flow to the
testicles.

[Series 1: us scrotum w/ doppler complete · 0.08mm/px · 13 of 53 slices shown]
[im 1/53]
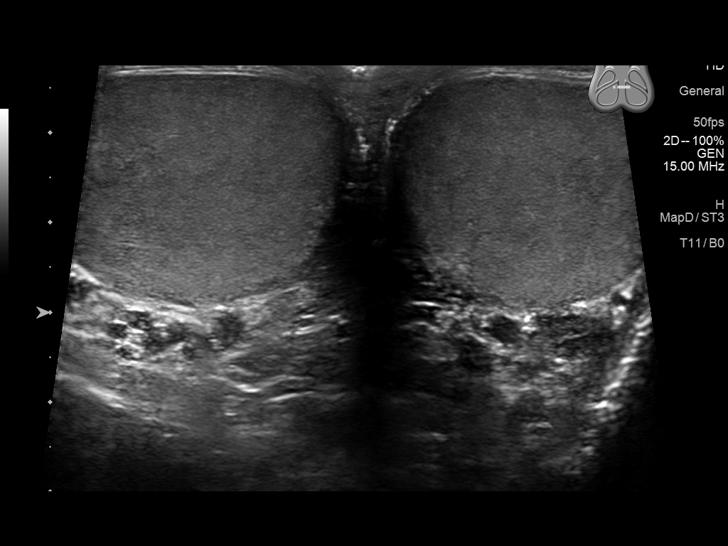
[im 5/53]
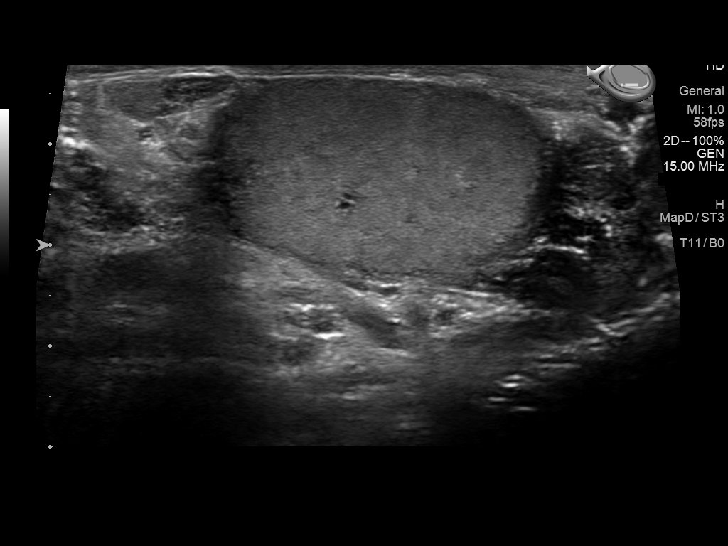
[im 9/53]
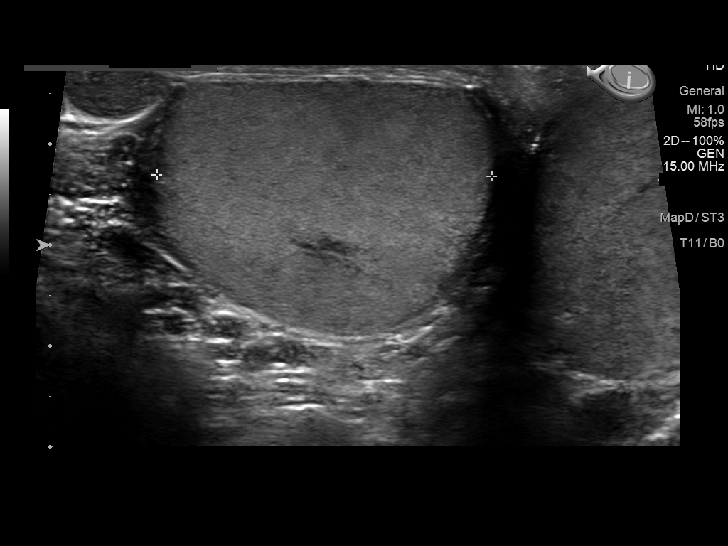
[im 14/53]
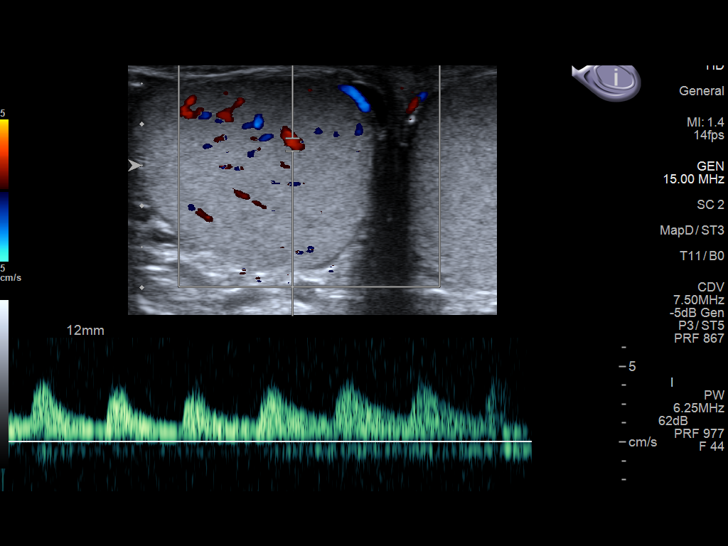
[im 18/53]
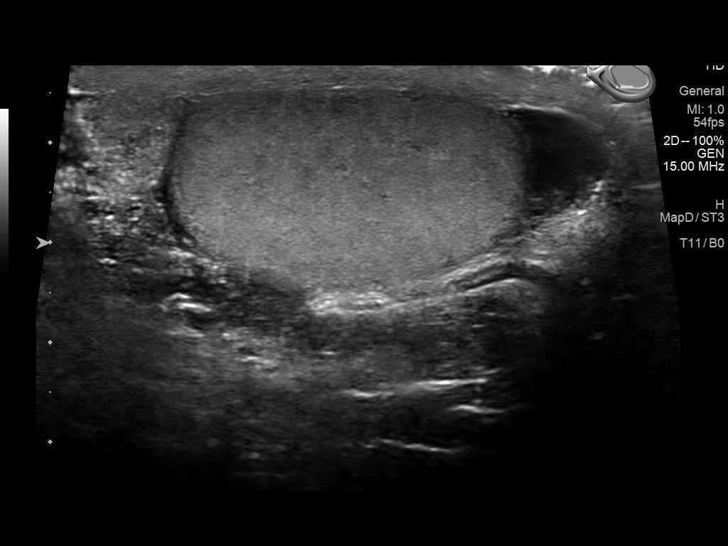
[im 22/53]
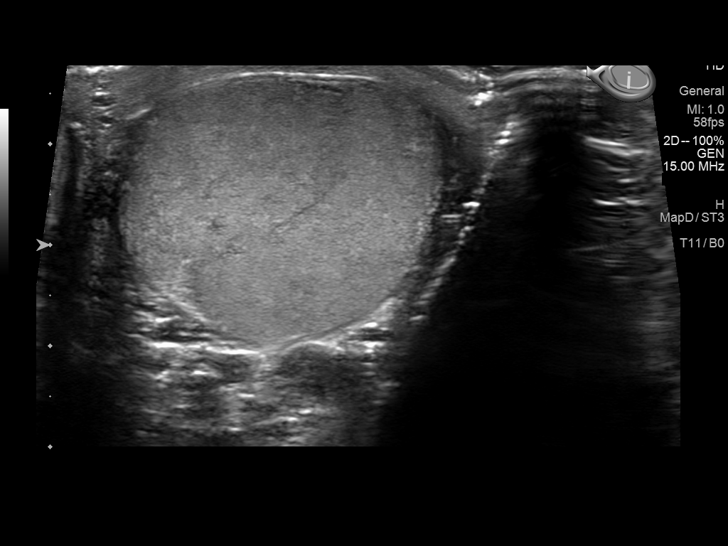
[im 27/53]
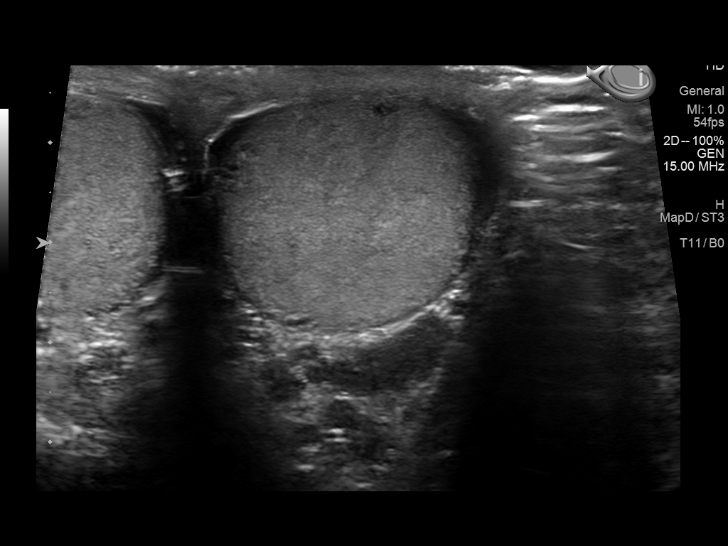
[im 31/53]
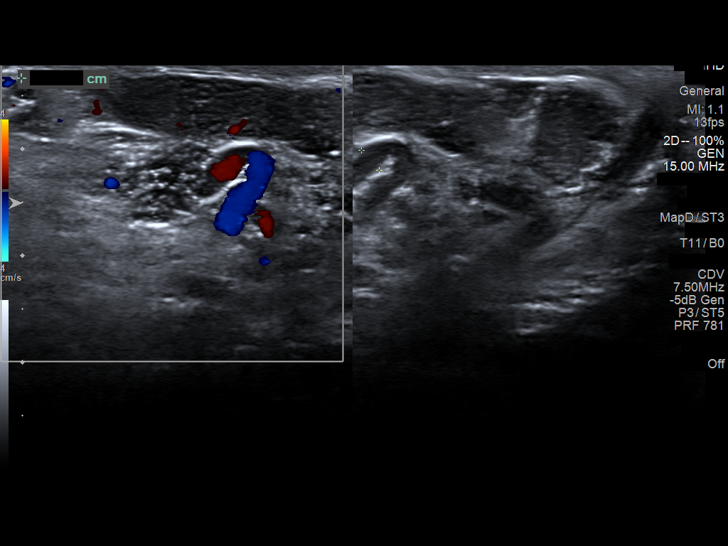
[im 35/53]
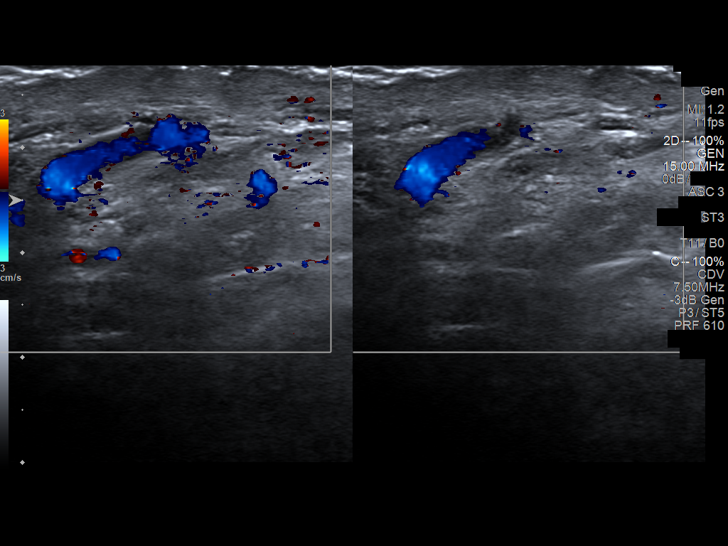
[im 40/53]
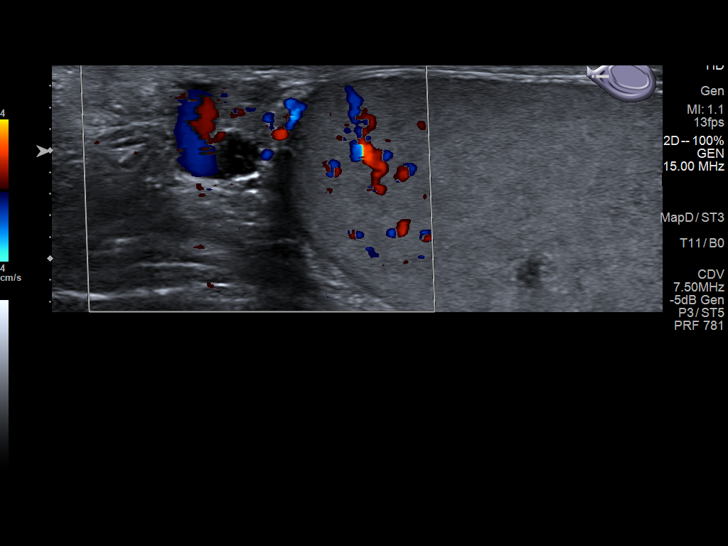
[im 44/53]
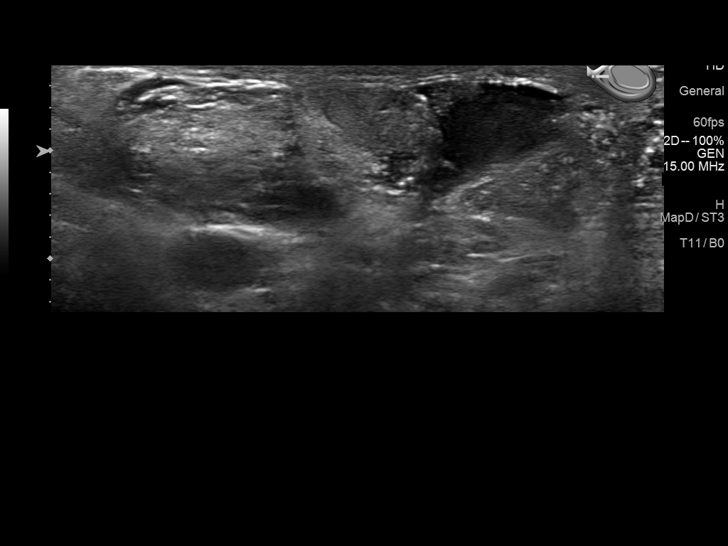
[im 48/53]
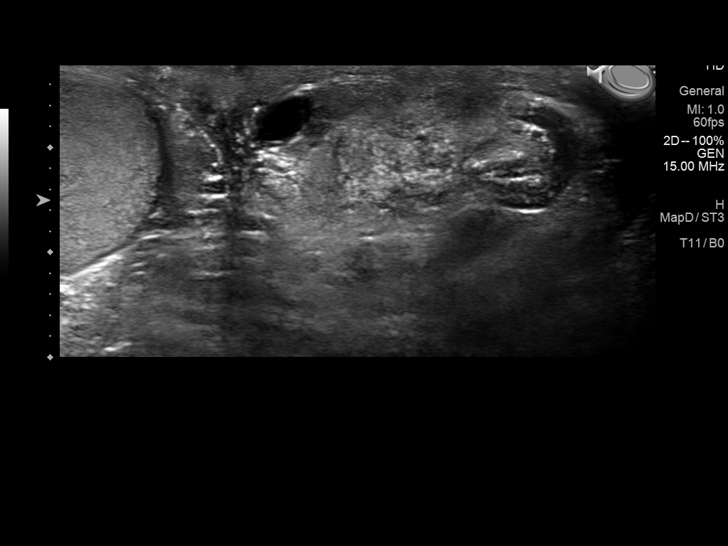
[im 53/53]
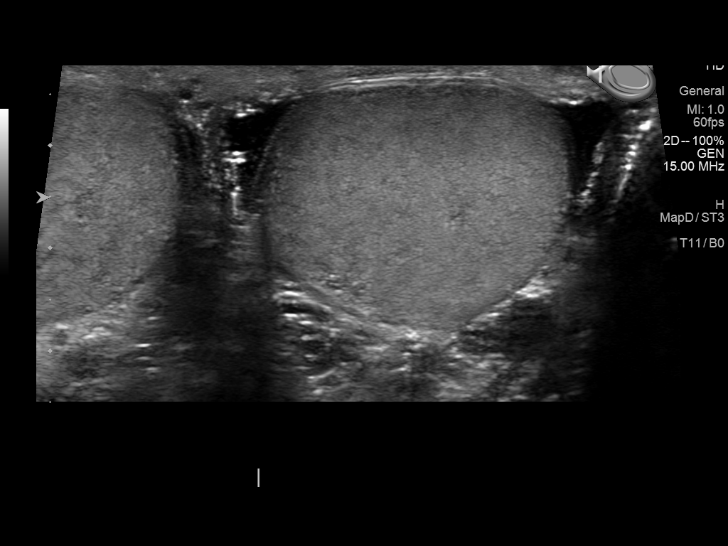

[13 of 25 positions shown; findings below may reference images not displayed]

FINDINGS: Right testicle

Measurements: 4.6 x 2.4 x 3.3 cm. Normal echogenicity without mass
or calcification. Internal blood flow present on color Doppler
imaging.

Left testicle

Measurements: 4.6 x 2.3 x 3.2 cm. Normal echogenicity without mass
or calcification. Internal blood flow present on color Doppler
imaging.

Right epididymis: Tubular ectasia, likely reflecting vasectomy.
Small cyst at epididymal head 5 x 5 x 7 mm.

Left epididymis: Heterogeneous, question developing tubular ectasia.
Small cyst at epididymal head 6 x 4 x 6 mm

Hydrocele:  Trace BILATERAL hydroceles

Varicocele:  None visualized.

Pulsed Doppler interrogation of both testes demonstrates normal low
resistance arterial and venous waveforms bilaterally.
IMPRESSION: Normal appearing testes bilaterally.

Tubular ectasia of the epididymi bilaterally likely reflecting
vasectomy.

Small epididymal head cysts bilaterally.

## 2020-12-22 ENCOUNTER — Other Ambulatory Visit: Payer: Self-pay | Admitting: Family Medicine

## 2020-12-22 DIAGNOSIS — E1049 Type 1 diabetes mellitus with other diabetic neurological complication: Secondary | ICD-10-CM

## 2020-12-22 DIAGNOSIS — N521 Erectile dysfunction due to diseases classified elsewhere: Secondary | ICD-10-CM

## 2020-12-24 LAB — HM DIABETES EYE EXAM

## 2021-01-13 ENCOUNTER — Other Ambulatory Visit: Payer: Self-pay

## 2021-01-13 MED FILL — Glucose Blood Test Strip: 60 days supply | Qty: 300 | Fill #0 | Status: AC

## 2021-02-07 MED FILL — Insulin Degludec Soln Pen-Injector 200 Unit/ML: SUBCUTANEOUS | 50 days supply | Qty: 9 | Fill #0 | Status: AC

## 2021-02-09 ENCOUNTER — Other Ambulatory Visit: Payer: Self-pay

## 2021-02-13 NOTE — Patient Instructions (Signed)

## 2021-02-13 NOTE — Progress Notes (Signed)
Name: Ivan Booker   MRN: [id number]    DOB: Mar 20, 1966   Date:02/16/2021       Progress Note  Subjective  Chief Complaint  Annual Exam  HPI  Patient presents for annual CPE  IPSS Questionnaire (AUA-7): Over the past month.   1)  How often have you had a sensation of not emptying your bladder completely after you finish urinating?  0 - Not at all  2)  How often have you had to urinate again less than two hours after you finished urinating? 0 - Not at all  3)  How often have you found you stopped and started again several times when you urinated?  0 - Not at all  4) How difficult have you found it to postpone urination?  0 - Not at all  5) How often have you had a weak urinary stream?  0 - Not at all  6) How often have you had to push or strain to begin urination?  0 - Not at all  7) How many times did you most typically get up to urinate from the time you went to bed until the time you got up in the morning?  1 - 1 time  Total score:  0-7 mildly symptomatic   8-19 moderately symptomatic   20-35 severely symptomatic     Diet: he packs his food, balanced diet  Exercise: he plays golf on a regular basis   Depression: phq 9 is negative Depression screen [facility] 2/9 02/16/2021 11/03/2020 07/28/2020 05/14/2020 02/20/2020  Decreased Interest 0 0 0 0 0  Down, Depressed, Hopeless 0 0 0 0 0  PHQ - 2 Score 0 0 0 0 0  Altered sleeping - - - 0 0  Tired, decreased energy - - - 0 0  Change in appetite - - - 0 0  Feeling bad or failure about yourself  - - - 0 0  Trouble concentrating - - - 0 0  Moving slowly or fidgety/restless - - - 0 0  Suicidal thoughts - - - 0 0  PHQ-9 Score - - - 0 0  Difficult doing work/chores - - - - Not difficult at all  Some recent data might be hidden    Hypertension:  BP Readings from Last 3 Encounters:  02/16/21 128/76  11/03/20 122/64  07/28/20 132/86    Obesity: Wt Readings from Last 3 Encounters:  02/16/21 233 lb (105.7 kg)  11/03/20 238 lb 6.4 oz  (108.1 kg)  07/28/20 236 lb 11.2 oz (107.4 kg)   BMI Readings from Last 3 Encounters:  02/16/21 29.92 kg/m  11/03/20 30.61 kg/m  07/28/20 30.39 kg/m     Lipids:  Lab Results  Component Value Date   CHOL 154 02/20/2020   CHOL 149 02/12/2019   CHOL 147 01/26/2018   Lab Results  Component Value Date   HDL 48 02/20/2020   HDL 47 02/12/2019   HDL 53 01/26/2018   Lab Results  Component Value Date   LDLCALC 83 02/20/2020   LDLCALC 82 02/12/2019   LDLCALC 80 01/26/2018   Lab Results  Component Value Date   TRIG 124 02/20/2020   TRIG 104 02/12/2019   TRIG 49 01/26/2018   Lab Results  Component Value Date   CHOLHDL 3.2 02/20/2020   CHOLHDL 3.2 02/12/2019   CHOLHDL 2.8 01/26/2018   No results found for: LDLDIRECT Glucose:  Glucose, Bld  Date Value Ref Range Status  02/20/2020 216 (H) 65 - 99 mg/dL  Final    Comment:    .            Fasting reference interval . For someone without known diabetes, a glucose value >125 mg/dL indicates that they may have diabetes and this should be confirmed with a follow-up test. .   02/12/2019 99 65 - 99 mg/dL Final    Comment:    .            Fasting reference interval .   01/26/2018 95 65 - 99 mg/dL Final    Comment:    .            Fasting reference interval .    Glucose-Capillary  Date Value Ref Range Status  03/02/2015 185 (H) 65 - 99 mg/dL Final    Flowsheet Row [facility] from [facility]/28/2020 in [facility]  AUDIT-C Score 0      Married STD testing and prevention (HIV/chl/gon/syphilis): not interested  Hep C: 05/09/12  Skin cancer: Discussed monitoring for atypical lesions Colorectal cancer: 05/10/14 Prostate cancer:  Lab Results  Component Value Date   PSA 0.4 02/20/2020   PSA 0.6 02/12/2019   PSA 0.5 01/26/2018     Lung cancer:  Low Dose CT Chest recommended if Age 52-80 years, 30 pack-year currently smoking OR have quit w/in 15years. Patient does not qualify.   AAA: The USPSTF  recommends one-time screening with ultrasonography in men ages 22 to 87 years who have ever smoked ECG:  03/03/15  Vaccines:  Shingrix: 60-64 yo and ask insurance if covered when patient above 28 yo Pneumonia: educated and discussed with patient. Flu: educated and discussed with patient.  Advanced Care Planning: A voluntary discussion about advance care planning including the explanation and discussion of advance directives.  Discussed health care proxy and Living will, and the patient was able to identify a health care proxy as wife   Patient Active Problem List   Diagnosis Date Noted  . Erectile dysfunction 06/26/2015  . BPH (benign prostatic hyperplasia) 03/20/2015  . 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 Dyslipidemia 03/07/2015  . Hypogonadism male 03/07/2015  . Obesity (BMI 30.0-34.9) 03/07/2015  . Allergic rhinitis, seasonal 03/07/2015  . Vitamin D deficiency 03/07/2015    Past Surgical History:  Procedure Laterality Date  . VASECTOMY  1993    Family History  Problem Relation Age of Onset  . COPD Father        Smoker   . Bladder Cancer Neg Hx   . Kidney cancer Neg Hx   . Prostate cancer Neg Hx     Social History   Socioeconomic History  . Marital status: Married    Spouse name: Ivan Booker  . Number of children: 5  . Years of education: Not on file  . Highest education level: High school graduate  Occupational History  . Not on file  Tobacco Use  . Smoking status: Never Smoker  . Smokeless tobacco: Never Used  Vaping Use  . Vaping Use: Never used  Substance and Sexual Activity  . Alcohol use: Yes    Comment: 1-2 beers on weekends  . Drug use: No  . Sexual activity: Yes    Partners: Female    Birth control/protection: None  Other Topics Concern  . Not on file  Social History Narrative  . Not on file   Social Determinants of Health   Financial Resource Strain: Low Risk   . Difficulty of Paying Living  Expenses: Not hard at all  Food Insecurity: No Food Insecurity  . Worried About Programme researcher, broadcasting/film/video in the Last Year: Never true  . Ran Out of Food in the Last Year: Never true  Transportation Needs: No Transportation Needs  . Lack of Transportation (Medical): No  . Lack of Transportation (Non-Medical): No  Physical Activity: Sufficiently Active  . Days of Exercise per Week: 6 days  . Minutes of Exercise per Session: 60 min  Stress: No Stress Concern Present  . Feeling of Stress : Not at all  Social Connections: Socially Integrated  . Frequency of Communication with Friends and Family: More than three times a week  . Frequency of Social Gatherings with Friends and Family: Twice a week  . Attends Religious Services: More than 4 times per year  . Active Member of Clubs or Organizations: Yes  . Attends Banker Meetings: Never  . Marital Status: Married  Catering manager Violence: Not At Risk  . Fear of Current or Ex-Partner: No  . Emotionally Abused: No  . Physically Abused: No  . Sexually Abused: No     Current Outpatient Medications:  .  aspirin 81 MG chewable tablet, Chew 1 tablet by mouth daily., Disp: , Rfl:  .  atorvastatin (LIPITOR) 40 MG tablet, TAKE 1 TABLET BY MOUTH DAILY., Disp: 90 tablet, Rfl: 1 .  Cholecalciferol (VITAMIN D) 2000 UNITS CAPS, Take 1 capsule by mouth daily., Disp: , Rfl:  .  Glucagon (GVOKE HYPOPEN 1-PACK) 1 MG/0.2ML SOAJ, Inject 1 mg into the skin daily as needed. May repeat in 15 minutes, Disp: 0.4 mL, Rfl: 1 .  Glucagon 0.5 MG/0.1ML SOSY, INJECT 1 MG INTO THE SKIN DAILY AS NEEDED. MAY REPEAT IN 15 MINUTES, Disp: .1 mL, Rfl: 1 .  glucose blood test strip, , Disp: , Rfl:  .  glucose blood test strip, CHECK FASTING BLOOD SUGARS 5 TIMES DAILY (Patient taking differently: CHECK FASTING BLOOD SUGARS 5 TIMES DAILY), Disp: 300 strip, Rfl: 2 .  insulin degludec (TRESIBA) 200 UNIT/ML FlexTouch Pen, INJECT 36 UNITS INTO THE SKIN DAILY., Disp: 9 mL,  Rfl: 2 .  insulin lispro (HUMALOG) 100 UNIT/ML KwikPen, INJECT 5 TO 16 UNITS INTO THE SKIN WITH MEALS, Disp: 45 mL, Rfl: 2 .  Insulin Pen Needle 31G X 8 MM MISC, USE AS DIRECTED, Disp: 100 each, Rfl: 3 .  Lancets (FREESTYLE) lancets, Use as instructed, Disp: 100 each, Rfl: 12 .  meloxicam (MOBIC) 15 MG tablet, TAKE 1 TABLET BY MOUTH DAILY., Disp: 30 tablet, Rfl: 0 .  sildenafil (REVATIO) 20 MG tablet, Take 1-2 tablets (20-40 mg total) by mouth daily as needed., Disp: 60 tablet, Rfl: 2  No Known Allergies   ROS  Constitutional: Negative for fever , positive for  weight change.  Respiratory: Negative for cough and shortness of breath.   Cardiovascular: Negative for chest pain or palpitations.  Gastrointestinal: Negative for abdominal pain, no bowel changes.  Musculoskeletal: Negative for gait problem or joint swelling.  Skin: Negative for rash.  Neurological: Negative for dizziness or headache.  No other specific complaints in a complete review of systems (except as listed in HPI above).  Objective  Vitals:   02/16/21 1439  BP: 128/76  Pulse: 85  Resp: 16  Temp: 98.7 F (37.1 C)  TempSrc: Oral  SpO2: 97%  Weight: 233 lb (105.7 kg)  Height: 6\' 2"  (1.88 m)    Body mass index is 29.92 kg/m.  Physical Exam  Constitutional: Patient  appears well-developed and well-nourished. No distress.  HENT: Head: Normocephalic and atraumatic. Ears: B TMs ok, no erythema or effusion; Nose: Nose normal. Mouth/Throat: Oropharynx is clear and moist. No oropharyngeal exudate.  Eyes: Conjunctivae and EOM are normal. Pupils are equal, round, and reactive to light. No scleral icterus.  Neck: Normal range of motion. Neck supple. No JVD present. No thyromegaly present.  Cardiovascular: Normal rate, regular rhythm and normal heart sounds.  No murmur heard. No BLE edema. Pulmonary/Chest: Effort normal and breath sounds normal. No respiratory distress. Abdominal: Soft. Bowel sounds are normal, no  distension. There is no tenderness. no masses MALE GENITALIA: Normal descended testes bilaterally, no masses palpated, no hernias, no lesions, no discharge  RECTAL: Prostate slightly enlarged in  size and consistency, no rectal masses or hemorrhoids Musculoskeletal: Normal range of motion, no joint effusions. No gross deformities Neurological: he is alert and oriented to person, place, and time. No cranial nerve deficit. Coordination, balance, strength, speech and gait are normal.  Skin: Skin is warm and dry. No rash noted. No erythema.  Psychiatric: Patient has a normal mood and affect. behavior is normal. Judgment and thought content normal.  Recent Results (from the past 2160 hour(s))  HM DIABETES EYE EXAM     Status: None   Collection Time: 12/24/20 12:00 AM  Result Value Ref Range   HM Diabetic Eye Exam No Retinopathy No Retinopathy     Fall Risk: Fall Risk  02/16/2021 11/03/2020 07/28/2020 05/14/2020 02/20/2020  Falls in the past year? 0 0 0 0 0  Number falls in past yr: 0 0 0 0 0  Comment - - - - -  Injury with Fall? 0 0 0 0 0  Comment - - - - -  Follow up - - - - Falls evaluation completed     Functional Status Survey: Is the patient deaf or have difficulty hearing?: No Does the patient have difficulty seeing, even when wearing glasses/contacts?: No Does the patient have difficulty concentrating, remembering, or making decisions?: No Does the patient have difficulty walking or climbing stairs?: No Does the patient have difficulty dressing or bathing?: No Does the patient have difficulty doing errands alone such as visiting a doctor's office or shopping?: No   Assessment & Plan  1. Well adult health check  - Lipid panel - Microalbumin / creatinine urine ratio - COMPLETE METABOLIC PANEL WITH GFR - CBC with Differential/Platelet - PSA - Hemoglobin A1c  2. 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)  - Microalbumin / creatinine urine  ratio - Hemoglobin A1c  3. Benign prostatic hyperplasia with urinary frequency  - PSA  4. Dyslipidemia  - Lipid panel  5. Long-term use of high-risk medication  - COMPLETE METABOLIC PANEL WITH GFR - CBC with Differential/Platelet  6. Need for shingles vaccine  - Varicella-zoster vaccine IM   -Prostate cancer screening and PSA options (with potential risks and benefits of testing vs not testing) were discussed along with recent recs/guidelines. -USPSTF grade A and B recommendations reviewed with patient; age-appropriate recommendations, preventive care, screening tests, etc discussed and encouraged; healthy living encouraged; see AVS for patient education given to patient -Discussed importance of 150 minutes of physical activity weekly, eat two servings of fish weekly, eat one serving of tree nuts ( cashews, pistachios, pecans, almonds.Marland Kitchen.) every other day, eat 6 servings of fruit/vegetables daily and drink plenty of water and avoid sweet beverages.

## 2021-02-16 ENCOUNTER — Ambulatory Visit (INDEPENDENT_AMBULATORY_CARE_PROVIDER_SITE_OTHER): Payer: No Typology Code available for payment source | Admitting: Family Medicine

## 2021-02-16 ENCOUNTER — Encounter: Payer: Self-pay | Admitting: Family Medicine

## 2021-02-16 ENCOUNTER — Other Ambulatory Visit: Payer: Self-pay

## 2021-02-16 VITALS — BP 128/76 | HR 85 | Temp 98.7°F | Resp 16 | Ht 74.0 in | Wt 233.0 lb

## 2021-02-16 DIAGNOSIS — Z0000 Encounter for general adult medical examination without abnormal findings: Secondary | ICD-10-CM

## 2021-02-16 DIAGNOSIS — Z79899 Other long term (current) drug therapy: Secondary | ICD-10-CM

## 2021-02-16 DIAGNOSIS — E785 Hyperlipidemia, unspecified: Secondary | ICD-10-CM | POA: Diagnosis not present

## 2021-02-16 DIAGNOSIS — E1049 Type 1 diabetes mellitus with other diabetic neurological complication: Secondary | ICD-10-CM | POA: Diagnosis not present

## 2021-02-16 DIAGNOSIS — N521 Erectile dysfunction due to diseases classified elsewhere: Secondary | ICD-10-CM

## 2021-02-16 DIAGNOSIS — Z23 Encounter for immunization: Secondary | ICD-10-CM

## 2021-02-16 DIAGNOSIS — R350 Frequency of micturition: Secondary | ICD-10-CM

## 2021-02-16 DIAGNOSIS — N401 Enlarged prostate with lower urinary tract symptoms: Secondary | ICD-10-CM

## 2021-02-17 LAB — COMPLETE METABOLIC PANEL WITH GFR
AG Ratio: 1.8 (calc) (ref 1.0–2.5)
ALT: 13 U/L (ref 9–46)
AST: 13 U/L (ref 10–35)
Albumin: 4.2 g/dL (ref 3.6–5.1)
Alkaline phosphatase (APISO): 87 U/L (ref 35–144)
BUN: 23 mg/dL (ref 7–25)
CO2: 28 mmol/L (ref 20–32)
Calcium: 9.4 mg/dL (ref 8.6–10.3)
Chloride: 103 mmol/L (ref 98–110)
Creat: 1.02 mg/dL (ref 0.70–1.33)
GFR, Est African American: 94 mL/min/{1.73_m2} (ref >=60)
GFR, Est Non African American: 81 mL/min/{1.73_m2} (ref >=60)
Globulin: 2.3 g/dL (calc) (ref 1.9–3.7)
Glucose, Bld: 155 mg/dL — ABNORMAL HIGH (ref 65–139)
Potassium: 4.3 mmol/L (ref 3.5–5.3)
Sodium: 138 mmol/L (ref 135–146)
Total Bilirubin: 0.8 mg/dL (ref 0.2–1.2)
Total Protein: 6.5 g/dL (ref 6.1–8.1)

## 2021-02-17 LAB — CBC WITH DIFFERENTIAL/PLATELET
Absolute Monocytes: 859 cells/uL (ref 200–950)
Basophils Absolute: 77 cells/uL (ref 0–200)
Basophils Relative: 0.9 %
Eosinophils Absolute: 281 cells/uL (ref 15–500)
Eosinophils Relative: 3.3 %
HCT: 46.8 % (ref 38.5–50.0)
Hemoglobin: 15.6 g/dL (ref 13.2–17.1)
Lymphs Abs: 2083 cells/uL (ref 850–3900)
MCH: 29.3 pg (ref 27.0–33.0)
MCHC: 33.3 g/dL (ref 32.0–36.0)
MCV: 88 fL (ref 80.0–100.0)
MPV: 10 fL (ref 7.5–12.5)
Monocytes Relative: 10.1 %
Neutro Abs: 5202 cells/uL (ref 1500–7800)
Neutrophils Relative %: 61.2 %
Platelets: 232 10*3/uL (ref 140–400)
RBC: 5.32 10*6/uL (ref 4.20–5.80)
RDW: 11.9 % (ref 11.0–15.0)
Total Lymphocyte: 24.5 %
WBC: 8.5 10*3/uL (ref 3.8–10.8)

## 2021-02-17 LAB — LIPID PANEL
Cholesterol: 145 mg/dL (ref <200)
HDL: 48 mg/dL (ref >=40)
LDL Cholesterol (Calc): 74 mg/dL (calc)
Non-HDL Cholesterol (Calc): 97 mg/dL (calc) (ref <130)
Total CHOL/HDL Ratio: 3 (calc) (ref <5.0)
Triglycerides: 148 mg/dL (ref <150)

## 2021-02-17 LAB — HEMOGLOBIN A1C
Hgb A1c MFr Bld: 7 % of total Hgb — ABNORMAL HIGH (ref <5.7)
Mean Plasma Glucose: 154 mg/dL
eAG (mmol/L): 8.5 mmol/L

## 2021-02-17 LAB — MICROALBUMIN / CREATININE URINE RATIO
Creatinine, Urine: 208 mg/dL (ref 20–320)
Microalb Creat Ratio: 2 mcg/mg creat (ref <30)
Microalb, Ur: 0.5 mg/dL

## 2021-02-17 LAB — PSA: PSA: 0.45 ng/mL (ref <=4.00)

## 2021-02-24 ENCOUNTER — Telehealth: Payer: Self-pay

## 2021-02-24 NOTE — Telephone Encounter (Signed)
Copied from CRM [id number]. Topic: General - Other >> Feb 24, 2021  8:04 AM Wyonia Hough E wrote: Reason for CRM: Pts mom will be leaving paperwork today to be completed and sent to [facility] to complete pts DOT physical/ Pt stated her needs this today/ please advise/pt asked to be called when this was sent to [facility] so he can get completed paperwork from them today

## 2021-03-01 ENCOUNTER — Other Ambulatory Visit: Payer: Self-pay | Admitting: Family Medicine

## 2021-03-01 DIAGNOSIS — E785 Hyperlipidemia, unspecified: Secondary | ICD-10-CM

## 2021-03-01 MED ORDER — ATORVASTATIN CALCIUM 40 MG PO TABS
ORAL_TABLET | Freq: Every day | ORAL | 1 refills | Status: DC
  Filled 2021-03-01: qty 90, 90d supply, fill #0

## 2021-03-01 MED FILL — Insulin Pen Needle 31 G X 8 MM (1/3" or 5/16"): 90 days supply | Qty: 100 | Fill #0 | Status: AC

## 2021-03-01 NOTE — Telephone Encounter (Signed)
Requested Prescriptions  Pending Prescriptions Disp Refills  . atorvastatin (LIPITOR) 40 MG tablet 90 tablet 1    Sig: TAKE 1 TABLET BY MOUTH DAILY.     Cardiovascular:  Antilipid - Statins Passed - 03/01/2021  7:03 AM      Passed - Total Cholesterol in normal range and within 360 days    Cholesterol, Total  Date Value Ref Range Status  04/06/2016 154 100 - 199 mg/dL Final   Cholesterol  Date Value Ref Range Status  02/16/2021 145 <200 mg/dL Final         Passed - LDL in normal range and within 360 days    LDL Cholesterol (Calc)  Date Value Ref Range Status  02/16/2021 74 mg/dL (calc) Final    Comment:    Reference range: <100 . Desirable range <100 mg/dL for primary prevention;   <70 mg/dL for patients with CHD or diabetic patients  with > or = 2 CHD risk factors. Marland Kitchen LDL-C is now calculated using the Martin-Hopkins  calculation, which is a validated novel method providing  better accuracy than the Friedewald equation in the  estimation of LDL-C.  Horald Pollen et al. Lenox Ahr. 1275;170(01): 2061-2068  (http://education.[facility].com/faq/FAQ164)          Passed - HDL in normal range and within 360 days    HDL  Date Value Ref Range Status  02/16/2021 48 > OR = 40 mg/dL Final  74/94/4967 54 >59 mg/dL Final         Passed - Triglycerides in normal range and within 360 days    Triglycerides  Date Value Ref Range Status  02/16/2021 148 <150 mg/dL Final         Passed - Patient is not pregnant      Passed - Valid encounter within last 12 months    Recent Outpatient Visits          1 week ago Well adult health check   [facility] [place], [name], MD   3 months ago Type 1 diabetes mellitus with neuropathy causing erectile dysfunction [facility])   [facility] [name], MD   7 months ago Type 1 diabetes mellitus with neuropathy causing erectile dysfunction [facility])   [facility] [name], MD   9 months  ago Other male erectile dysfunction   [facility] [place], [name], MD   1 year ago Type 1 diabetes mellitus with neuropathy causing erectile dysfunction [facility])   [facility] [name], MD      Future Appointments            In 2 months Carlynn Purl, [name], MD [facility]

## 2021-03-02 ENCOUNTER — Other Ambulatory Visit: Payer: Self-pay

## 2021-03-14 MED FILL — Glucose Blood Test Strip: 60 days supply | Qty: 300 | Fill #1 | Status: AC

## 2021-03-16 ENCOUNTER — Other Ambulatory Visit: Payer: Self-pay

## 2021-03-30 MED FILL — Insulin Degludec Soln Pen-Injector 200 Unit/ML: SUBCUTANEOUS | 50 days supply | Qty: 9 | Fill #1 | Status: AC

## 2021-03-31 ENCOUNTER — Other Ambulatory Visit: Payer: Self-pay

## 2021-04-03 ENCOUNTER — Other Ambulatory Visit: Payer: Self-pay | Admitting: Family Medicine

## 2021-04-03 ENCOUNTER — Other Ambulatory Visit (HOSPITAL_COMMUNITY): Payer: Self-pay

## 2021-04-03 DIAGNOSIS — N528 Other male erectile dysfunction: Secondary | ICD-10-CM

## 2021-04-09 NOTE — Progress Notes (Signed)
Name: Ivan Booker   MRN: [id number]    DOB: 10-20-1965   Date:04/10/2021       Progress Note  Subjective  Chief Complaint  Congestion/Ear Stopped Up  I connected with  [name]  on 04/10/21 at  2:40 PM EDT by a video enabled telemedicine application and verified that I am speaking with the correct person using two identifiers.  I discussed the limitations of evaluation and management by telemedicine and the availability of in person appointments. The patient expressed understanding and agreed to proceed with the virtual visit  Staff also discussed with the patient that there may be a patient responsible charge related to this service. Patient Location: at home  Provider Location: [facility] Additional Individuals present: alone   HPI  Sinusitis: symptoms started about 4 weeks ago, post-nasal drainage, facial pressure and right ear fullness ( it pops at times) no fever or chills. Normal appetite and glucose has been controlled. He has tried multiple otc mediations but decided to call in since not feeling better.   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) 03/07/2015   Dyslipidemia 03/07/2015   Hypogonadism male 03/07/2015   Obesity (BMI 30.0-34.9) 03/07/2015   Allergic rhinitis, seasonal 03/07/2015   Vitamin D deficiency 03/07/2015    Past Surgical History:  Procedure Laterality Date   VASECTOMY  1993    Family History  Problem Relation Age of Onset   COPD Father        Smoker    Bladder Cancer Neg Hx    Kidney cancer Neg Hx    Prostate cancer Neg Hx     Social History   Socioeconomic History   Marital status: Married    Spouse name: Zella Ball   Number of children: 5   Years of education: Not on file   Highest education level: High school graduate  Occupational History   Not on file  Tobacco Use    Smoking status: Never   Smokeless tobacco: Never  Vaping Use   Vaping Use: Never used  Substance and Sexual Activity   Alcohol use: Yes    Comment: 1-2 beers on weekends   Drug use: No   Sexual activity: Yes    Partners: Female    Birth control/protection: None  Other Topics Concern   Not on file  Social History Narrative   Not on file   Social Determinants of Health   Financial Resource Strain: Low Risk    Difficulty of Paying Living Expenses: Not hard at all  Food Insecurity: No Food Insecurity   Worried About Programme researcher, broadcasting/film/video in the Last Year: Never true   Barista in the Last Year: Never true  Transportation Needs: No Transportation Needs   Lack of Transportation (Medical): No   Lack of Transportation (Non-Medical): No  Physical Activity: Sufficiently Active   Days of Exercise per Week: 6 days   Minutes of Exercise per Session: 60 min  Stress: No Stress Concern Present   Feeling of Stress : Not at all  Social Connections: Socially Integrated   Frequency of Communication with Friends and Family: More than three times a week   Frequency of Social Gatherings with Friends and Family: Twice a week   Attends Religious Services: More than 4 times per year   Active Member of [employer] or Organizations:  Yes   Attends Banker Meetings: Never   Marital Status: Married  Catering manager Violence: Not At Risk   Fear of Current or Ex-Partner: No   Emotionally Abused: No   Physically Abused: No   Sexually Abused: No     Current Outpatient Medications:    aspirin 81 MG chewable tablet, Chew 1 tablet by mouth daily., Disp: , Rfl:    atorvastatin (LIPITOR) 40 MG tablet, TAKE 1 TABLET BY MOUTH DAILY., Disp: 90 tablet, Rfl: 1   Cholecalciferol (VITAMIN D) 2000 UNITS CAPS, Take 1 capsule by mouth daily., Disp: , Rfl:    Glucagon (GVOKE HYPOPEN 1-PACK) 1 MG/0.2ML SOAJ, Inject 1 mg into the skin daily as needed. May repeat in 15 minutes, Disp: 0.4 mL, Rfl: 1    Glucagon 0.5 MG/0.1ML SOSY, INJECT 1 MG INTO THE SKIN DAILY AS NEEDED. MAY REPEAT IN 15 MINUTES, Disp: .1 mL, Rfl: 1   glucose blood test strip, , Disp: , Rfl:    glucose blood test strip, CHECK FASTING BLOOD SUGARS 5 TIMES DAILY (Patient taking differently: CHECK FASTING BLOOD SUGARS 5 TIMES DAILY), Disp: 300 strip, Rfl: 2   insulin degludec (TRESIBA) 200 UNIT/ML FlexTouch Pen, INJECT 36 UNITS INTO THE SKIN DAILY., Disp: 9 mL, Rfl: 2   insulin lispro (HUMALOG) 100 UNIT/ML KwikPen, INJECT 5 TO 16 UNITS INTO THE SKIN WITH MEALS, Disp: 45 mL, Rfl: 2   Insulin Pen Needle 31G X 8 MM MISC, USE AS DIRECTED, Disp: 100 each, Rfl: 3   Lancets (FREESTYLE) lancets, Use as instructed, Disp: 100 each, Rfl: 12   sildenafil (REVATIO) 20 MG tablet, TAKE 1-2 TABLETS BY MOUTH DAILY AS NEEDED., Disp: 60 tablet, Rfl: 2   meloxicam (MOBIC) 15 MG tablet, TAKE 1 TABLET BY MOUTH DAILY. (Patient not taking: Reported on 04/10/2021), Disp: 30 tablet, Rfl: 0  No Known Allergies  I personally reviewed active problem list, medication list, allergies with the patient/caregiver today.   ROS  Ten systems reviewed and is negative except as mentioned in HPI  Objective  Virtual encounter, vitals not obtained.  There is no height or weight on file to calculate BMI.  Physical Exam  Awake, alert and oriented   PHQ2/9: Depression screen [facility] 2/9 04/10/2021 02/16/2021 11/03/2020 07/28/2020 05/14/2020  Decreased Interest 0 0 0 0 0  Down, Depressed, Hopeless 0 0 0 0 0  PHQ - 2 Score 0 0 0 0 0  Altered sleeping - - - - 0  Tired, decreased energy - - - - 0  Change in appetite - - - - 0  Feeling bad or failure about yourself  - - - - 0  Trouble concentrating - - - - 0  Moving slowly or fidgety/restless - - - - 0  Suicidal thoughts - - - - 0  PHQ-9 Score - - - - 0  Difficult doing work/chores - - - - -  Some recent data might be hidden   PHQ-2/9 Result is negative.    Fall Risk: Fall Risk  04/10/2021 02/16/2021 11/03/2020  07/28/2020 05/14/2020  Falls in the past year? 0 0 0 0 0  Number falls in past yr: 0 0 0 0 0  Comment - - - - -  Injury with Fall? 0 0 0 0 0  Comment - - - - -  Follow up - - - - -     Assessment & Plan   1. Subacute maxillary sinusitis  - fluticasone (FLONASE) 50 MCG/ACT nasal spray;  Place 2 sprays into both nostrils daily.  Dispense: 16 g; Refill: 0 - azithromycin (ZITHROMAX) 500 MG tablet; Take 1 tablet (500 mg total) by mouth daily.  Dispense: 3 tablet; Refill: 0    I discussed the assessment and treatment plan with the patient. The patient was provided an opportunity to ask questions and all were answered. The patient agreed with the plan and demonstrated an understanding of the instructions.  The patient was advised to call back or seek an in-person evaluation if the symptoms worsen or if the condition fails to improve as anticipated.  I provided 15  minutes of non-face-to-face time during this encounter.

## 2021-04-10 ENCOUNTER — Telehealth: Payer: Self-pay

## 2021-04-10 ENCOUNTER — Telehealth (INDEPENDENT_AMBULATORY_CARE_PROVIDER_SITE_OTHER): Payer: No Typology Code available for payment source | Admitting: Family Medicine

## 2021-04-10 ENCOUNTER — Encounter: Payer: Self-pay | Admitting: Family Medicine

## 2021-04-10 DIAGNOSIS — J0100 Acute maxillary sinusitis, unspecified: Secondary | ICD-10-CM | POA: Diagnosis not present

## 2021-04-10 MED ORDER — AZITHROMYCIN 500 MG PO TABS: 500.0000 mg | ORAL_TABLET | Freq: Every day | ORAL | 0 refills | Status: DC

## 2021-04-10 MED ORDER — FLUTICASONE PROPIONATE 50 MCG/ACT NA SUSP: 2.0000 | Freq: Every day | NASAL | 0 refills | Status: DC

## 2021-04-10 NOTE — Telephone Encounter (Signed)
Copied from CRM [id number]. Topic: Appointment Scheduling - Scheduling Inquiry for Clinic >> Apr 10, 2021 12:39 PM Marylen Ponto wrote: Reason for CRM: Pt stated he had a missed call from the office in regards to his virtual appt scheduled for today. Pt requests call back

## 2021-04-20 ENCOUNTER — Other Ambulatory Visit: Payer: Self-pay

## 2021-04-20 MED FILL — Insulin Lispro Soln Pen-injector 100 Unit/ML (1 Unit Dial): SUBCUTANEOUS | 90 days supply | Qty: 45 | Fill #0 | Status: AC

## 2021-04-20 NOTE — Progress Notes (Signed)
Name: Ivan Booker   MRN: [id number]    DOB: 01-20-1966   Date:04/22/2021       Progress Note  Subjective  Chief Complaint  Check Ears  HPI  Ear fullness: patient had a virtual visit on 07/15 because of one month of post-nasal drainage, facial pressure and right ear fullness, we treated with Azithromycin 500 mg for three days and Flonase. He states the fullness has improved but has not resolved. Going in and out now. Still has mild post-nasal drainage but facial pressure has resolved. No fever or chills.   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) 03/07/2015   Dyslipidemia 03/07/2015   Hypogonadism male 03/07/2015   Obesity (BMI 30.0-34.9) 03/07/2015   Allergic rhinitis, seasonal 03/07/2015   Vitamin D deficiency 03/07/2015    Past Surgical History:  Procedure Laterality Date   VASECTOMY  1993    Family History  Problem Relation Age of Onset   COPD Father        Smoker    Bladder Cancer Neg Hx    Kidney cancer Neg Hx    Prostate cancer Neg Hx     Social History   Tobacco Use   Smoking status: Never   Smokeless tobacco: Never  Substance Use Topics   Alcohol use: Yes    Comment: 1-2 beers on weekends     Current Outpatient Medications:    aspirin 81 MG chewable tablet, Chew 1 tablet by mouth daily., Disp: , Rfl:    atorvastatin (LIPITOR) 40 MG tablet, TAKE 1 TABLET BY MOUTH DAILY., Disp: 90 tablet, Rfl: 1   azelastine (ASTELIN) 0.1 % nasal spray, Place 2 sprays into both nostrils daily. Use in each nostril as directed, Disp: 30 mL, Rfl: 0   azithromycin (ZITHROMAX) 500 MG tablet, Take 1 tablet (500 mg total) by mouth daily., Disp: 3 tablet, Rfl: 0   Cholecalciferol (VITAMIN D) 2000 UNITS CAPS, Take 1 capsule by mouth daily., Disp: , Rfl:    fluticasone (FLONASE) 50 MCG/ACT nasal spray, Place 2  sprays into both nostrils daily., Disp: 16 g, Rfl: 0   Glucagon (GVOKE HYPOPEN 1-PACK) 1 MG/0.2ML SOAJ, Inject 1 mg into the skin daily as needed. May repeat in 15 minutes, Disp: 0.4 mL, Rfl: 1   Glucagon 0.5 MG/0.1ML SOSY, INJECT 1 MG INTO THE SKIN DAILY AS NEEDED. MAY REPEAT IN 15 MINUTES, Disp: .1 mL, Rfl: 1   glucose blood test strip, , Disp: , Rfl:    glucose blood test strip, CHECK FASTING BLOOD SUGARS 5 TIMES DAILY (Patient taking differently: CHECK FASTING BLOOD SUGARS 5 TIMES DAILY), Disp: 300 strip, Rfl: 2   insulin degludec (TRESIBA) 200 UNIT/ML FlexTouch Pen, INJECT 36 UNITS INTO THE SKIN DAILY., Disp: 9 mL, Rfl: 2   insulin lispro (HUMALOG) 100 UNIT/ML KwikPen, INJECT 5 TO 16 UNITS INTO THE SKIN WITH MEALS, Disp: 45 mL, Rfl: 2   Insulin Pen Needle 31G X 8 MM MISC, USE AS DIRECTED, Disp: 100 each, Rfl: 3   Lancets (FREESTYLE) lancets, Use as instructed, Disp: 100 each, Rfl: 12   loratadine (CLARITIN) 10 MG tablet, Take 1 tablet (10 mg total) by mouth daily., Disp: 30 tablet, Rfl: 0   meloxicam (MOBIC) 15 MG tablet, TAKE 1 TABLET BY MOUTH DAILY., Disp: 30 tablet, Rfl: 0   sildenafil (REVATIO) 20 MG tablet, TAKE 1-2  TABLETS BY MOUTH DAILY AS NEEDED., Disp: 60 tablet, Rfl: 2  No Known Allergies  I personally reviewed active problem list, medication list, allergies, family history with the patient/caregiver today.   ROS  Ten systems reviewed and is negative except as mentioned in HPI   Objective  Vitals:   04/22/21 1445  BP: 120/72  Pulse: 70  Resp: 16  Temp: 98 F (36.7 C)  SpO2: 97%  Weight: 232 lb (105.2 kg)  Height: 6\' 2"  (1.88 m)    Body mass index is 29.79 kg/m.  Physical Exam  Constitutional: Patient appears well-developed and well-nourished. Overweight.  No distress.  HEENT: head atraumatic, normocephalic, pupils equal and reactive to light, ears normal TM, neck supple Cardiovascular: Normal rate, regular rhythm and normal heart sounds.  No murmur heard. No  BLE edema. Pulmonary/Chest: Effort normal and breath sounds normal. No respiratory distress. Abdominal: Soft.  There is no tenderness. Psychiatric: Patient has a normal mood and affect. behavior is normal. Judgment and thought content normal.   Recent Results (from the past 2160 hour(s))  Lipid panel     Status: None   Collection Time: 02/16/21  3:12 PM  Result Value Ref Range   Cholesterol 145 <200 mg/dL   HDL 48 > OR = 40 mg/dL   Triglycerides 02/18/21 462 mg/dL   LDL Cholesterol (Calc) 74 mg/dL (calc)    Comment: Reference range: <100 . Desirable range <100 mg/dL for primary prevention;   <70 mg/dL for patients with CHD or diabetic patients  with > or = 2 CHD risk factors. <703 LDL-C is now calculated using the Martin-Hopkins  calculation, which is a validated novel method providing  better accuracy than the Friedewald equation in the  estimation of LDL-C.  Marland Kitchen et al. Horald Pollen. Lenox Ahr): 2061-2068  (http://education.[facility].com/faq/FAQ164)    Total CHOL/HDL Ratio 3.0 <5.0 (calc)   Non-HDL Cholesterol (Calc) 97 [birth year]1971 mg/dL (calc)    Comment: For patients with diabetes plus 1 major ASCVD risk  factor, treating to a non-HDL-C goal of <100 mg/dL  (LDL-C of <993 mg/dL) is considered a therapeutic  option.   Microalbumin / creatinine urine ratio     Status: None   Collection Time: 02/16/21  3:12 PM  Result Value Ref Range   Creatinine, Urine 208 20 - 320 mg/dL   Microalb, Ur 0.5 mg/dL    Comment: Reference Range Not established    Microalb Creat Ratio 2 <30 mcg/mg creat    Comment: . The ADA defines abnormalities in albumin excretion as follows: 02/18/21 Albuminuria Category        Result (mcg/mg creatinine) . Normal to Mildly increased   <30 Moderately increased         30-299  Severely increased           > OR = 300 . The ADA recommends that at least two of three specimens collected within a 3-6 month period be abnormal before considering a patient to be within a  diagnostic category.   COMPLETE METABOLIC PANEL WITH GFR     Status: Abnormal   Collection Time: 02/16/21  3:12 PM  Result Value Ref Range   Glucose, Bld 155 (H) 65 - 139 mg/dL    Comment: .        Non-fasting reference interval .    BUN 23 7 - 25 mg/dL   Creat 02/18/21 6.96 - 7.89 mg/dL    Comment: For patients >41 years of age, the reference limit for Creatinine is approximately 13%  higher for people identified as African-American. .    GFR, Est Non African American 81 > OR = 60 mL/min/1.73m2   GFR, Est African American 94 > OR = 60 mL/min/1.8m2   BUN/Creatinine Ratio NOT APPLICABLE 6 - 22 (calc)   Sodium 138 135 - 146 mmol/L   Potassium 4.3 3.5 - 5.3 mmol/L   Chloride 103 98 - 110 mmol/L   CO2 28 20 - 32 mmol/L   Calcium 9.4 8.6 - 10.3 mg/dL   Total Protein 6.5 6.1 - 8.1 g/dL   Albumin 4.2 3.6 - 5.1 g/dL   Globulin 2.3 1.9 - 3.7 g/dL (calc)   AG Ratio 1.8 1.0 - 2.5 (calc)   Total Bilirubin 0.8 0.2 - 1.2 mg/dL   Alkaline phosphatase (APISO) 87 35 - 144 U/L   AST 13 10 - 35 U/L   ALT 13 9 - 46 U/L  CBC with Differential/Platelet     Status: None   Collection Time: 02/16/21  3:12 PM  Result Value Ref Range   WBC 8.5 3.8 - 10.8 Thousand/uL   RBC 5.32 4.20 - 5.80 Million/uL   Hemoglobin 15.6 13.2 - 17.1 g/dL   HCT 93.2 35.5 - 73.2 %   MCV 88.0 80.0 - 100.0 fL   MCH 29.3 27.0 - 33.0 pg   MCHC 33.3 32.0 - 36.0 g/dL   RDW 20.2 54.2 - 70.6 %   Platelets 232 140 - 400 Thousand/uL   MPV 10.0 7.5 - 12.5 fL   Neutro Abs 5,202 1,500 - 7,800 cells/uL   Lymphs Abs 2,083 850 - 3,900 cells/uL   Absolute Monocytes 859 200 - 950 cells/uL   Eosinophils Absolute 281 15 - 500 cells/uL   Basophils Absolute 77 0 - 200 cells/uL   Neutrophils Relative % 61.2 %   Total Lymphocyte 24.5 %   Monocytes Relative 10.1 %   Eosinophils Relative 3.3 %   Basophils Relative 0.9 %  PSA     Status: None   Collection Time: 02/16/21  3:12 PM  Result Value Ref Range   PSA 0.45 < OR = 4.00 ng/mL     Comment: The total PSA value from this assay system is  standardized against the WHO standard. The test  result will be approximately 20% lower when compared  to the equimolar-standardized total PSA (Beckman  Coulter). Comparison of serial PSA results should be  interpreted with this fact in mind. . This test was performed using the Siemens  chemiluminescent method. Values obtained from  different assay methods cannot be used interchangeably. PSA levels, regardless of value, should not be interpreted as absolute evidence of the presence or absence of disease.   Hemoglobin A1c     Status: Abnormal   Collection Time: 02/16/21  3:12 PM  Result Value Ref Range   Hgb A1c MFr Bld 7.0 (H) <5.7 % of total Hgb    Comment: For someone without known diabetes, a hemoglobin A1c value of 6.5% or greater indicates that they may have  diabetes and this should be confirmed with a follow-up  test. . For someone with known diabetes, a value <7% indicates  that their diabetes is well controlled and a value  greater than or equal to 7% indicates suboptimal  control. A1c targets should be individualized based on  duration of diabetes, age, comorbid conditions, and  other considerations. . Currently, no consensus exists regarding use of hemoglobin A1c for diagnosis of diabetes for children. .    Mean Plasma Glucose 154  mg/dL   eAG (mmol/L) 8.5 mmol/L     PHQ2/9: Depression screen [facility] 2/9 04/22/2021 04/10/2021 02/16/2021 11/03/2020 07/28/2020  Decreased Interest 0 0 0 0 0  Down, Depressed, Hopeless 0 0 0 0 0  PHQ - 2 Score 0 0 0 0 0  Altered sleeping - - - - -  Tired, decreased energy - - - - -  Change in appetite - - - - -  Feeling bad or failure about yourself  - - - - -  Trouble concentrating - - - - -  Moving slowly or fidgety/restless - - - - -  Suicidal thoughts - - - - -  PHQ-9 Score - - - - -  Difficult doing work/chores - - - - -  Some recent data might be hidden    phq 9 is  negative   Fall Risk: Fall Risk  04/22/2021 04/10/2021 02/16/2021 11/03/2020 07/28/2020  Falls in the past year? 0 0 0 0 0  Number falls in past yr: 0 0 0 0 0  Comment - - - - -  Injury with Fall? 0 0 0 0 0  Comment - - - - -  Follow up - - - - -     Functional Status Survey: Is the patient deaf or have difficulty hearing?: No Does the patient have difficulty seeing, even when wearing glasses/contacts?: No Does the patient have difficulty concentrating, remembering, or making decisions?: No Does the patient have difficulty walking or climbing stairs?: No Does the patient have difficulty dressing or bathing?: No Does the patient have difficulty doing errands alone such as visiting a doctor's office or shopping?: No    Assessment & Plan  1. Eustachian tube disorder, right  - loratadine (CLARITIN) 10 MG tablet; Take 1 tablet (10 mg total) by mouth daily.  Dispense: 30 tablet; Refill: 0 - azelastine (ASTELIN) 0.1 % nasal spray; Place 2 sprays into both nostrils daily. Use in each nostril as directed  Dispense: 30 mL; Refill: 0 - Ambulatory referral to ENT

## 2021-04-22 ENCOUNTER — Other Ambulatory Visit: Payer: Self-pay

## 2021-04-22 ENCOUNTER — Encounter: Payer: Self-pay | Admitting: Family Medicine

## 2021-04-22 ENCOUNTER — Ambulatory Visit (INDEPENDENT_AMBULATORY_CARE_PROVIDER_SITE_OTHER): Payer: No Typology Code available for payment source | Admitting: Family Medicine

## 2021-04-22 VITALS — BP 120/72 | HR 70 | Temp 98.0°F | Resp 16 | Ht 74.0 in | Wt 232.0 lb

## 2021-04-22 DIAGNOSIS — H6991 Unspecified Eustachian tube disorder, right ear: Secondary | ICD-10-CM

## 2021-04-22 MED ORDER — LORATADINE 10 MG PO TABS
10.0000 mg | ORAL_TABLET | Freq: Every day | ORAL | 0 refills | Status: DC
Start: 2021-04-22 — End: 2021-05-19
  Filled 2021-04-22: qty 30, 30d supply, fill #0

## 2021-04-22 MED ORDER — AZELASTINE HCL 0.1 % NA SOLN
2.0000 | Freq: Every day | NASAL | 0 refills | Status: DC
  Filled 2021-04-22: qty 30, 30d supply, fill #0

## 2021-04-30 ENCOUNTER — Other Ambulatory Visit: Payer: Self-pay

## 2021-04-30 MED FILL — Insulin Pen Needle 31 G X 8 MM (1/3" or 5/16"): 30 days supply | Qty: 100 | Fill #1 | Status: AC

## 2021-05-10 ENCOUNTER — Other Ambulatory Visit: Payer: Self-pay | Admitting: Family Medicine

## 2021-05-10 DIAGNOSIS — E1069 Type 1 diabetes mellitus with other specified complication: Secondary | ICD-10-CM

## 2021-05-10 DIAGNOSIS — N521 Erectile dysfunction due to diseases classified elsewhere: Secondary | ICD-10-CM

## 2021-05-10 DIAGNOSIS — E1049 Type 1 diabetes mellitus with other diabetic neurological complication: Secondary | ICD-10-CM

## 2021-05-10 NOTE — Telephone Encounter (Signed)
Requested medication (s) are due for refill today: yes  Requested medication (s) are on the active medication list: no  Last refill:  11/14/20 #300 2 RF  Future visit scheduled: yes  Notes to clinic:  pt requesting Brand Name "Freestyle light"   Requested Prescriptions  Pending Prescriptions Disp Refills   glucose blood (FREESTYLE LITE) test strip 300 strip 2    Sig: CHECK FASTING BLOOD SUGARS 5 TIMES DAILY     Endocrinology: Diabetes - Testing Supplies Passed - 05/10/2021  7:45 AM      Passed - Valid encounter within last 12 months    Recent Outpatient Visits           2 weeks ago Eustachian tube disorder, right   [facility] [name], MD   1 month ago Subacute maxillary sinusitis   [facility] [name], MD   2 months ago Well adult health check   [facility] [place], [name], MD   6 months ago Type 1 diabetes mellitus with neuropathy causing erectile dysfunction [facility])   [facility] [name], MD   9 months ago Type 1 diabetes mellitus with neuropathy causing erectile dysfunction [facility])   [facility] [name], MD       Future Appointments             In 1 week [name], MD [facility]

## 2021-05-11 ENCOUNTER — Other Ambulatory Visit: Payer: Self-pay

## 2021-05-11 ENCOUNTER — Other Ambulatory Visit: Payer: Self-pay | Admitting: Family Medicine

## 2021-05-11 DIAGNOSIS — E1049 Type 1 diabetes mellitus with other diabetic neurological complication: Secondary | ICD-10-CM

## 2021-05-11 DIAGNOSIS — E1069 Type 1 diabetes mellitus with other specified complication: Secondary | ICD-10-CM

## 2021-05-11 MED FILL — Glucose Blood Test Strip: 60 days supply | Qty: 300 | Fill #0 | Status: AC

## 2021-05-19 ENCOUNTER — Other Ambulatory Visit: Payer: Self-pay

## 2021-05-19 ENCOUNTER — Ambulatory Visit (INDEPENDENT_AMBULATORY_CARE_PROVIDER_SITE_OTHER): Payer: No Typology Code available for payment source | Admitting: Family Medicine

## 2021-05-19 ENCOUNTER — Encounter: Payer: Self-pay | Admitting: Family Medicine

## 2021-05-19 VITALS — BP 124/64 | HR 84 | Temp 98.4°F | Resp 16 | Ht 74.0 in | Wt 231.0 lb

## 2021-05-19 DIAGNOSIS — N521 Erectile dysfunction due to diseases classified elsewhere: Secondary | ICD-10-CM

## 2021-05-19 DIAGNOSIS — N401 Enlarged prostate with lower urinary tract symptoms: Secondary | ICD-10-CM

## 2021-05-19 DIAGNOSIS — R350 Frequency of micturition: Secondary | ICD-10-CM

## 2021-05-19 DIAGNOSIS — E559 Vitamin D deficiency, unspecified: Secondary | ICD-10-CM

## 2021-05-19 DIAGNOSIS — M7701 Medial epicondylitis, right elbow: Secondary | ICD-10-CM

## 2021-05-19 DIAGNOSIS — E291 Testicular hypofunction: Secondary | ICD-10-CM | POA: Diagnosis not present

## 2021-05-19 DIAGNOSIS — E1049 Type 1 diabetes mellitus with other diabetic neurological complication: Secondary | ICD-10-CM

## 2021-05-19 DIAGNOSIS — E785 Hyperlipidemia, unspecified: Secondary | ICD-10-CM

## 2021-05-19 DIAGNOSIS — Z23 Encounter for immunization: Secondary | ICD-10-CM | POA: Diagnosis not present

## 2021-05-19 LAB — POCT GLYCOSYLATED HEMOGLOBIN (HGB A1C): Hemoglobin A1C: 6.9 % — AB (ref 4.0–5.6)

## 2021-05-19 MED ORDER — DEXCOM G6 SENSOR MISC
1.0000 | 5 refills | Status: DC
  Filled 2021-05-19 – 2021-05-22 (×2): qty 3, 30d supply, fill #0
  Filled 2021-07-31: qty 3, 30d supply, fill #1
  Filled 2021-08-31: qty 3, 30d supply, fill #2
  Filled 2021-10-03: qty 3, 30d supply, fill #3
  Filled 2021-10-30: qty 3, 30d supply, fill #4
  Filled 2021-12-04: qty 3, 30d supply, fill #5

## 2021-05-19 MED ORDER — ATORVASTATIN CALCIUM 80 MG PO TABS
80.0000 mg | ORAL_TABLET | Freq: Every day | ORAL | 3 refills | Status: DC
  Filled 2021-05-19: qty 90, 90d supply, fill #0
  Filled 2021-11-25: qty 90, 90d supply, fill #1

## 2021-05-19 NOTE — Progress Notes (Signed)
Name: Ivan Booker   MRN: [id number]    DOB: [birth year]   Date:05/19/2021       Progress Note  Subjective  Chief Complaint  Follow Up  HPI  DMI with ED: he is doing well ,A1C today is 6.9 %, He is checking his glucose multiple times a day - at least 4 times a day. No polyphagia, polyuria or polydipsia. ED he is back Revatio. He was diagnosed with DM as an young adult ( mid-20's ) and has good knowledge of his condition.  Plays golf often.  Blood sugar has been running 148 average.  Lowest 52 yesterday and was coming home from work and felt like his sugar was low.  Had a sugar drink and did fine.  Discussed Dexacom with patient, he is interested in trying. Gave patient sample.    BPH: used to see Urologist - Dr. Lonna Cobb - years ago, had normal biopsies in the past. He was on Cialis but stopped working, taking sildenafil and is doing well now - he gets it from [employer] through [employer] . Unchanged   Hypogonadism: He is not on testosterone supplementation anymore because insurance denied coverage.  Energy level continues to be good.    Hyperlipidemia: at goal, taking Atorvastatin every day, No complaints of cramping or chest pain. LDL was 74 last time , He continues to take the Atorvastatin and tolerating it well.  LDL is close to goal, discussed going up on dose.  He will take 80 mg of the Atorvastatin and let us know if he cannot tolerate.    The 10-year ASCVD risk score Denman George DC Montez Hageman., et al., 2013) is: 8.8%   Values used to calculate the score:     Age: 55 years     Sex: Male     Is Non-Hispanic African American: No     Diabetic: Yes     Tobacco smoker: No     Systolic Blood Pressure: 124 mmHg     Is BP treated: No     HDL Cholesterol: 48 mg/dL     Total Cholesterol: 145 mg/dL    Vitamin D deficiency: He is taking his vitamin D supplement daily.   Golfer's elbow: Plays golf frequently.  He uses a brace sometimes and that helps.  He says it only flares up once in a while.    Ear  fullness: Was seen 7/27 for one month of post-nasal drainage, facial pressure and right ear fullness. Going to see ENT Dr. Willeen Cass tomorrow.  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) 03/07/2015   Dyslipidemia 03/07/2015   Hypogonadism male 03/07/2015   Obesity (BMI 30.0-34.9) 03/07/2015   Allergic rhinitis, seasonal 03/07/2015   Vitamin D deficiency 03/07/2015    Past Surgical History:  Procedure Laterality Date   VASECTOMY  1993    Family History  Problem Relation Age of Onset   COPD Father        Smoker    Bladder Cancer Neg Hx    Kidney cancer Neg Hx    Prostate cancer Neg Hx     Social History   Tobacco Use   Smoking status: Never   Smokeless tobacco: Never  Substance Use Topics   Alcohol use: Yes    Comment: 1-2 beers on weekends     Current Outpatient Medications:    aspirin 81  MG chewable tablet, Chew 1 tablet by mouth daily., Disp: , Rfl:    atorvastatin (LIPITOR) 80 MG tablet, Take 1 tablet (80 mg total) by mouth daily., Disp: 90 tablet, Rfl: 3   azelastine (ASTELIN) 0.1 % nasal spray, Place 2 sprays into both nostrils daily. Use in each nostril as directed, Disp: 30 mL, Rfl: 0   Cholecalciferol (VITAMIN D) 2000 UNITS CAPS, Take 1 capsule by mouth daily., Disp: , Rfl:    Continuous Blood Gluc Sensor (DEXCOM G6 SENSOR) MISC, 1 each by Does not apply route as directed. Every ten days, Disp: 3 each, Rfl: 5   Glucagon (GVOKE HYPOPEN 1-PACK) 1 MG/0.2ML SOAJ, Inject 1 mg into the skin daily as needed. May repeat in 15 minutes, Disp: 0.4 mL, Rfl: 1   Glucagon 0.5 MG/0.1ML SOSY, INJECT 1 MG INTO THE SKIN DAILY AS NEEDED. MAY REPEAT IN 15 MINUTES, Disp: .1 mL, Rfl: 1   glucose blood (FREESTYLE LITE) test strip, CHECK FASTING BLOOD SUGARS 5 TIMES DAILY, Disp: 300 strip, Rfl: 2   insulin degludec  (TRESIBA) 200 UNIT/ML FlexTouch Pen, INJECT 36 UNITS INTO THE SKIN DAILY., Disp: 9 mL, Rfl: 2   insulin lispro (HUMALOG) 100 UNIT/ML KwikPen, INJECT 5 TO 16 UNITS INTO THE SKIN WITH MEALS, Disp: 45 mL, Rfl: 2   Insulin Pen Needle 31G X 8 MM MISC, USE AS DIRECTED, Disp: 100 each, Rfl: 3   Lancets (FREESTYLE) lancets, Use as instructed, Disp: 100 each, Rfl: 12   sildenafil (REVATIO) 20 MG tablet, TAKE 1-2 TABLETS BY MOUTH DAILY AS NEEDED., Disp: 60 tablet, Rfl: 2  No Known Allergies  I personally reviewed active problem list, medication list, allergies, family history, social history, health maintenance, notes from last encounter with the patient/caregiver today.   ROS  Constitutional: Negative for fever or weight change.  Respiratory: Negative for cough and shortness of breath.   Cardiovascular: Negative for chest pain or palpitations.  Gastrointestinal: Negative for abdominal pain, no bowel changes.  Musculoskeletal: Negative for gait problem or joint swelling.  Skin: Negative for rash.  Neurological: Negative for dizziness or headache.  No other specific complaints in a complete review of systems (except as listed in HPI above).   Objective  Vitals:   05/19/21 1516  BP: 124/64  Pulse: 84  Resp: 16  Temp: 98.4 F (36.9 C)  SpO2: 96%  Weight: 231 lb (104.8 kg)  Height: 6\' 2"  (1.88 m)    Body mass index is 29.66 kg/m.  Physical Exam Constitutional: Patient appears well-developed and well-nourished. Obese  No distress.  HEENT: head atraumatic, normocephalic, pupils equal and reactive to light, neck supple Cardiovascular: Normal rate, regular rhythm and normal heart sounds.  No murmur heard. No BLE edema. Pulmonary/Chest: Effort normal and breath sounds normal. No respiratory distress. Abdominal: Soft.  There is no tenderness. Psychiatric: Patient has a normal mood and affect. behavior is normal. Judgment and thought content normal.   Recent Results (from the past 2160  hour(s))  POCT HgB A1C     Status: Abnormal   Collection Time: 05/19/21  3:20 PM  Result Value Ref Range   Hemoglobin A1C 6.9 (A) 4.0 - 5.6 %   HbA1c POC (<> result, manual entry)     HbA1c, POC (prediabetic range)     HbA1c, POC (controlled diabetic range)       PHQ2/9: Depression screen [facility] 2/9 05/19/2021 04/22/2021 04/10/2021 02/16/2021 11/03/2020  Decreased Interest 0 0 0 0 0  Down, Depressed, Hopeless 0 0 0  0 0  PHQ - 2 Score 0 0 0 0 0  Altered sleeping - - - - -  Tired, decreased energy - - - - -  Change in appetite - - - - -  Feeling bad or failure about yourself  - - - - -  Trouble concentrating - - - - -  Moving slowly or fidgety/restless - - - - -  Suicidal thoughts - - - - -  PHQ-9 Score - - - - -  Difficult doing work/chores - - - - -  Some recent data might be hidden    phq 9 is negative   Fall Risk: Fall Risk  05/19/2021 04/22/2021 04/10/2021 02/16/2021 11/03/2020  Falls in the past year? 0 0 0 0 0  Number falls in past yr: 0 0 0 0 0  Comment - - - - -  Injury with Fall? 0 0 0 0 0  Comment - - - - -  Risk for fall due to : No Fall Risks - - - -  Follow up Falls prevention discussed - - - -    Assessment & Plan  1. 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)  - POCT HgB A1C - Continuous Blood Gluc Sensor (DEXCOM G6 SENSOR) MISC; 1 each by Does not apply route as directed. Every ten days  Dispense: 3 each; Refill: 5  2. Golfer's elbow, right   3. Vitamin D deficiency   4. Hypogonadism male   5. Dyslipidemia  - atorvastatin (LIPITOR) 80 MG tablet; Take 1 tablet (80 mg total) by mouth daily.  Dispense: 90 tablet; Refill: 3  6. Need for shingles vaccine  - Varicella-zoster vaccine IM   I, Della Goo ,acting as a scribe for Ruel Favors, MD.,have documented all relevant documentation on the behalf of Ruel Favors, MD,as directed by  Ruel Favors, MD while in the presence of Ruel Favors, MD.

## 2021-05-20 ENCOUNTER — Other Ambulatory Visit: Payer: Self-pay | Admitting: Family Medicine

## 2021-05-20 ENCOUNTER — Other Ambulatory Visit: Payer: Self-pay

## 2021-05-20 DIAGNOSIS — N521 Erectile dysfunction due to diseases classified elsewhere: Secondary | ICD-10-CM

## 2021-05-20 DIAGNOSIS — E1049 Type 1 diabetes mellitus with other diabetic neurological complication: Secondary | ICD-10-CM

## 2021-05-20 MED ORDER — FLUTICASONE PROPIONATE 50 MCG/ACT NA SUSP
2.0000 | Freq: Every day | NASAL | 12 refills | Status: DC
  Filled 2021-05-20: qty 16, 30d supply, fill #0
  Filled 2021-07-24: qty 16, 30d supply, fill #1
  Filled 2021-10-03: qty 16, 30d supply, fill #2
  Filled 2021-12-04: qty 16, 30d supply, fill #3
  Filled 2022-02-16: qty 16, 30d supply, fill #4
  Filled 2022-04-06: qty 16, 30d supply, fill #5

## 2021-05-20 MED ORDER — AZELASTINE HCL 0.1 % NA SOLN
NASAL | 12 refills | Status: DC
  Filled 2021-05-20: qty 30, 30d supply, fill #0
  Filled 2021-07-24: qty 30, 30d supply, fill #1
  Filled 2021-10-03: qty 30, 30d supply, fill #2
  Filled 2021-12-04: qty 30, 30d supply, fill #3
  Filled 2022-02-16: qty 30, 30d supply, fill #4
  Filled 2022-04-06: qty 30, 30d supply, fill #5

## 2021-05-21 ENCOUNTER — Other Ambulatory Visit: Payer: Self-pay

## 2021-05-21 MED ORDER — TRESIBA FLEXTOUCH 200 UNIT/ML ~~LOC~~ SOPN
PEN_INJECTOR | SUBCUTANEOUS | 2 refills | Status: DC
  Filled 2021-05-21: qty 9, 50d supply, fill #0
  Filled 2021-07-10: qty 9, 50d supply, fill #1
  Filled 2021-08-30: qty 9, 50d supply, fill #2

## 2021-05-22 ENCOUNTER — Other Ambulatory Visit: Payer: Self-pay

## 2021-05-25 ENCOUNTER — Other Ambulatory Visit: Payer: Self-pay

## 2021-05-25 MED FILL — Glucagon Subcutaneous Soln Pref Syringe 0.5 MG/0.1ML: SUBCUTANEOUS | 1 days supply | Qty: 0.1 | Fill #0 | Status: AC

## 2021-05-26 ENCOUNTER — Other Ambulatory Visit: Payer: Self-pay

## 2021-07-13 ENCOUNTER — Other Ambulatory Visit: Payer: Self-pay

## 2021-07-16 ENCOUNTER — Other Ambulatory Visit: Payer: Self-pay

## 2021-07-16 ENCOUNTER — Other Ambulatory Visit: Payer: Self-pay | Admitting: Family Medicine

## 2021-07-16 DIAGNOSIS — E1049 Type 1 diabetes mellitus with other diabetic neurological complication: Secondary | ICD-10-CM

## 2021-07-16 DIAGNOSIS — N521 Erectile dysfunction due to diseases classified elsewhere: Secondary | ICD-10-CM

## 2021-07-16 MED FILL — Glucose Blood Test Strip: 60 days supply | Qty: 300 | Fill #1 | Status: AC

## 2021-07-17 ENCOUNTER — Other Ambulatory Visit: Payer: Self-pay

## 2021-07-17 MED FILL — Insulin Pen Needle 31 G X 8 MM (1/3" or 5/16"): 30 days supply | Qty: 100 | Fill #0 | Status: AC

## 2021-07-17 NOTE — Telephone Encounter (Signed)
Requested Prescriptions  Pending Prescriptions Disp Refills  . UNIFINE PENTIPS 31G X 8 MM MISC [Pharmacy Med Name: Insulin Pen Needle 31G X 8 MM Misc] 100 each 3    Sig: USE AS DIRECTED     Endocrinology: Diabetes - Testing Supplies Passed - 07/16/2021  5:20 PM      Passed - Valid encounter within last 12 months    Recent Outpatient Visits          1 month ago Type 1 diabetes mellitus with neuropathy causing erectile dysfunction [facility])   [facility] [name], MD   2 months ago Eustachian tube disorder, right   [facility] [name], MD   3 months ago Subacute maxillary sinusitis   [facility] [name], MD   5 months ago Well adult health check   [facility] [name], MD   8 months ago Type 1 diabetes mellitus with neuropathy causing erectile dysfunction [facility])   [facility] [name], MD      Future Appointments            In 1 month Carlynn Purl, [name], MD [facility], [facility]

## 2021-07-24 ENCOUNTER — Other Ambulatory Visit: Payer: Self-pay

## 2021-07-29 ENCOUNTER — Other Ambulatory Visit: Payer: Self-pay

## 2021-07-31 ENCOUNTER — Other Ambulatory Visit: Payer: Self-pay

## 2021-08-08 MED FILL — Insulin Lispro Soln Pen-injector 100 Unit/ML (1 Unit Dial): SUBCUTANEOUS | 90 days supply | Qty: 45 | Fill #1 | Status: AC

## 2021-08-10 ENCOUNTER — Other Ambulatory Visit: Payer: Self-pay

## 2021-08-19 ENCOUNTER — Other Ambulatory Visit: Payer: Self-pay

## 2021-08-19 ENCOUNTER — Other Ambulatory Visit: Payer: Self-pay | Admitting: Family Medicine

## 2021-08-19 DIAGNOSIS — N528 Other male erectile dysfunction: Secondary | ICD-10-CM

## 2021-08-19 NOTE — Telephone Encounter (Signed)
Requested Prescriptions  Pending Prescriptions Disp Refills  . sildenafil (REVATIO) 20 MG tablet [Pharmacy Med Name: SILDENAFIL 20 MG TABLET] 60 tablet 2    Sig: TAKE 1 TO 2 TABLETS BY MOUTH DAILY AS NEEDED     Urology: Erectile Dysfunction Agents Passed - 08/19/2021  6:11 AM      Passed - Last BP in normal range    BP Readings from Last 1 Encounters:  05/19/21 124/64         Passed - Valid encounter within last 12 months    Recent Outpatient Visits          3 months ago Type 1 diabetes mellitus with neuropathy causing erectile dysfunction [facility])   [facility] [name], MD   3 months ago Eustachian tube disorder, right   [facility] [name], MD   4 months ago Subacute maxillary sinusitis   [facility] [name], MD   6 months ago Well adult health check   [facility] [name], MD   9 months ago Type 1 diabetes mellitus with neuropathy causing erectile dysfunction [facility])   [facility] [name], MD      Future Appointments            In 1 week [name], MD [facility], [facility]

## 2021-08-25 NOTE — Progress Notes (Signed)
Name: Ivan Booker   MRN: [id number]    DOB: [birth year]   Date:08/26/2021       Progress Note  Subjective  Chief Complaint  Follow Up  HPI  DMI with ED: he is doing well ,A1C today is 7 % , he states it is going up since he started eating bread for breakfast again. He changed his schedule at work and is not able to play golf in the afternoons now that it dark at night.  He is checking his glucose multiple times a day - at least 3 times a day, using Dexcom and able to monitor for low glucose. No polyphagia, polyuria or polydipsia. ED he is back Revatio. He was diagnosed with DM as an young adult ( mid-20's ) and has good knowledge of his condition.    Blood sugar average on his monitor 163 past 30 days.     BPH: used to see Urologist - Dr. Lonna Cobb - years ago, had normal biopsies in the past. He was on Cialis but stopped working, taking sildenafil and is doing well now - he gets it from [employer] through [employer] . Still working for him    Hypogonadism: He is not on testosterone supplementation anymore because insurance denied coverage.  Energy level continues to be good.    Hyperlipidemia: at goal, taking Atorvastatin every day, No complaints of cramping or chest pain. LDL was 74 last time , He continues to take the Atorvastatin and tolerating it well.  LDL is close to goal, and we tried to increase to 80 mg but he could not tolerate it and is back to 40 mg due to muscle aches Discussed switching to Crestor but he would like to hold off until next visit   The 10-year ASCVD risk score (Arnett DK, et al., 2019) is: 9.8%   Values used to calculate the score:     Age: 55 years     Sex: Male     Is Non-Hispanic African American: No     Diabetic: Yes     Tobacco smoker: No     Systolic Blood Pressure: 132 mmHg     Is BP treated: No     HDL Cholesterol: 48 mg/dL     Total Cholesterol: 145 mg/dL    Vitamin D deficiency: He is taking his vitamin D supplement daily.We will recheck labs next  visit    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) 03/07/2015   Dyslipidemia 03/07/2015   Hypogonadism male 03/07/2015   Obesity (BMI 30.0-34.9) 03/07/2015   Allergic rhinitis, seasonal 03/07/2015   Vitamin D deficiency 03/07/2015    Past Surgical History:  Procedure Laterality Date   VASECTOMY  1993    Family History  Problem Relation Age of Onset   COPD Father        Smoker    Bladder Cancer Neg Hx    Kidney cancer Neg Hx    Prostate cancer Neg Hx     Social History   Tobacco Use   Smoking status: Never   Smokeless tobacco: Never  Substance Use Topics   Alcohol use: Yes    Comment: 1-2 beers on weekends     Current Outpatient Medications:    aspirin 81 MG chewable tablet, Chew 1 tablet by mouth daily., Disp: , Rfl:    atorvastatin (LIPITOR) 80  MG tablet, Take 1 tablet (80 mg total) by mouth daily., Disp: 90 tablet, Rfl: 3   azelastine (ASTELIN) 0.1 % nasal spray, Instill 2 sprays in each nostril twice daily, Disp: 30 mL, Rfl: 12   Cholecalciferol (VITAMIN D) 2000 UNITS CAPS, Take 1 capsule by mouth daily., Disp: , Rfl:    Continuous Blood Gluc Sensor (DEXCOM G6 SENSOR) MISC, 1 each by Does not apply route as directed. Every ten days, Disp: 3 each, Rfl: 5   fluticasone (FLONASE) 50 MCG/ACT nasal spray, 2 sprays in each nostril once daily, Disp: 16 g, Rfl: 12   Glucagon (GVOKE HYPOPEN 1-PACK) 1 MG/0.2ML SOAJ, Inject 1 mg into the skin daily as needed. May repeat in 15 minutes, Disp: 0.4 mL, Rfl: 1   glucose blood (FREESTYLE LITE) test strip, CHECK FASTING BLOOD SUGARS 5 TIMES DAILY, Disp: 300 strip, Rfl: 2   insulin degludec (TRESIBA FLEXTOUCH) 200 UNIT/ML FlexTouch Pen, INJECT 36 UNITS INTO THE SKIN DAILY., Disp: 9 mL, Rfl: 2   insulin lispro (HUMALOG) 100 UNIT/ML KwikPen, INJECT 5 TO 16 UNITS INTO  THE SKIN WITH MEALS, Disp: 45 mL, Rfl: 2   Insulin Pen Needle (UNIFINE PENTIPS) 31G X 8 MM MISC, USE AS DIRECTED, Disp: 100 each, Rfl: 3   Lancets (FREESTYLE) lancets, Use as instructed, Disp: 100 each, Rfl: 12   sildenafil (REVATIO) 20 MG tablet, TAKE 1 TO 2 TABLETS BY MOUTH DAILY AS NEEDED, Disp: 60 tablet, Rfl: 2  No Known Allergies  I personally reviewed active problem list, medication list, allergies, family history, social history, health maintenance with the patient/caregiver today.   ROS  Constitutional: Negative for fever or weight change.  Respiratory: Negative for cough and shortness of breath.   Cardiovascular: Negative for chest pain or palpitations.  Gastrointestinal: Negative for abdominal pain, no bowel changes.  Musculoskeletal: Negative for gait problem or joint swelling.  Skin: Negative for rash.  Neurological: Negative for dizziness or headache.  No other specific complaints in a complete review of systems (except as listed in HPI above).   Objective  Vitals:   08/26/21 0947  BP: 132/84  Pulse: 79  Resp: 16  Temp: 98.1 F (36.7 C)  SpO2: 97%  Weight: 233 lb (105.7 kg)  Height: 6\' 2"  (1.88 m)    Body mass index is 29.92 kg/m.  Physical Exam  Constitutional: Patient appears well-developed and well-nourished. Overweight.  No distress.  HEENT: head atraumatic, normocephalic, pupils equal and reactive to light,  neck supple Cardiovascular: Normal rate, regular rhythm and normal heart sounds.  No murmur heard. No BLE edema. Pulmonary/Chest: Effort normal and breath sounds normal. No respiratory distress. Abdominal: Soft.  There is no tenderness. Psychiatric: Patient has a normal mood and affect. behavior is normal. Judgment and thought content normal.    PHQ2/9: Depression screen [facility] 2/9 08/26/2021 05/19/2021 04/22/2021 04/10/2021 02/16/2021  Decreased Interest 0 0 0 0 0  Down, Depressed, Hopeless 0 0 0 0 0  PHQ - 2 Score 0 0 0 0 0  Altered sleeping 0 -  - - -  Tired, decreased energy 0 - - - -  Change in appetite 0 - - - -  Feeling bad or failure about yourself  0 - - - -  Trouble concentrating 0 - - - -  Moving slowly or fidgety/restless 0 - - - -  Suicidal thoughts 0 - - - -  PHQ-9 Score 0 - - - -  Difficult doing work/chores - - - - -  Some recent data might be hidden    phq 9 is negative  Fall Risk: Fall Risk  08/26/2021 05/19/2021 04/22/2021 04/10/2021 02/16/2021  Falls in the past year? 0 0 0 0 0  Number falls in past yr: 0 0 0 0 0  Comment - - - - -  Injury with Fall? 0 0 0 0 0  Comment - - - - -  Risk for fall due to : No Fall Risks No Fall Risks - - -  Follow up Falls prevention discussed Falls prevention discussed - - -      Functional Status Survey: Is the patient deaf or have difficulty hearing?: No Does the patient have difficulty seeing, even when wearing glasses/contacts?: No Does the patient have difficulty concentrating, remembering, or making decisions?: No Does the patient have difficulty walking or climbing stairs?: No Does the patient have difficulty dressing or bathing?: No Does the patient have difficulty doing errands alone such as visiting a doctor's office or shopping?: No    Assessment & Plan  1. 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 Need for immunization against influenza  - Flu Vaccine QUAD 6+ mos PF IM (Fluarix Quad PF)  3. Hypogonadism male   4. Dyslipidemia   5. Vitamin D deficiency   6. Benign prostatic hyperplasia with urinary frequency   7. LADA (latent autoimmune diabetes in adults), managed as type 1 (HCC)   8. Diabetic erectile dysfunction associated with type 1 diabetes mellitus (HCC)

## 2021-08-26 ENCOUNTER — Ambulatory Visit (INDEPENDENT_AMBULATORY_CARE_PROVIDER_SITE_OTHER): Payer: No Typology Code available for payment source | Admitting: Family Medicine

## 2021-08-26 ENCOUNTER — Encounter: Payer: Self-pay | Admitting: Family Medicine

## 2021-08-26 VITALS — BP 132/84 | HR 79 | Temp 98.1°F | Resp 16 | Ht 74.0 in | Wt 233.0 lb

## 2021-08-26 DIAGNOSIS — E559 Vitamin D deficiency, unspecified: Secondary | ICD-10-CM

## 2021-08-26 DIAGNOSIS — R350 Frequency of micturition: Secondary | ICD-10-CM

## 2021-08-26 DIAGNOSIS — E1069 Type 1 diabetes mellitus with other specified complication: Secondary | ICD-10-CM

## 2021-08-26 DIAGNOSIS — Z23 Encounter for immunization: Secondary | ICD-10-CM

## 2021-08-26 DIAGNOSIS — E785 Hyperlipidemia, unspecified: Secondary | ICD-10-CM | POA: Diagnosis not present

## 2021-08-26 DIAGNOSIS — N521 Erectile dysfunction due to diseases classified elsewhere: Secondary | ICD-10-CM | POA: Diagnosis not present

## 2021-08-26 DIAGNOSIS — N401 Enlarged prostate with lower urinary tract symptoms: Secondary | ICD-10-CM

## 2021-08-26 DIAGNOSIS — E139 Other specified diabetes mellitus without complications: Secondary | ICD-10-CM

## 2021-08-26 DIAGNOSIS — E291 Testicular hypofunction: Secondary | ICD-10-CM | POA: Diagnosis not present

## 2021-08-26 DIAGNOSIS — E1049 Type 1 diabetes mellitus with other diabetic neurological complication: Secondary | ICD-10-CM | POA: Diagnosis not present

## 2021-08-26 LAB — POCT GLYCOSYLATED HEMOGLOBIN (HGB A1C): Hemoglobin A1C: 7 % — AB (ref 4.0–5.6)

## 2021-08-31 ENCOUNTER — Other Ambulatory Visit: Payer: Self-pay

## 2021-09-24 ENCOUNTER — Other Ambulatory Visit: Payer: Self-pay

## 2021-09-24 MED FILL — Insulin Pen Needle 31 G X 8 MM (1/3" or 5/16"): 30 days supply | Qty: 100 | Fill #1 | Status: AC

## 2021-09-24 MED FILL — Glucose Blood Test Strip: 60 days supply | Qty: 300 | Fill #2 | Status: AC

## 2021-10-02 ENCOUNTER — Telehealth: Payer: Self-pay | Admitting: Family Medicine

## 2021-10-02 ENCOUNTER — Other Ambulatory Visit: Payer: Self-pay

## 2021-10-02 DIAGNOSIS — E1049 Type 1 diabetes mellitus with other diabetic neurological complication: Secondary | ICD-10-CM

## 2021-10-02 MED ORDER — DEXCOM G6 TRANSMITTER MISC
1.0000 | Freq: Every day | 2 refills | Status: DC
  Filled 2021-10-02: qty 1, 90d supply, fill #0
  Filled 2021-10-02: qty 1, 30d supply, fill #0
  Filled 2021-10-06: qty 1, 90d supply, fill #0
  Filled 2021-10-06: qty 1, 30d supply, fill #0
  Filled 2022-01-08 – 2022-01-14 (×2): qty 1, 90d supply, fill #1
  Filled 2022-04-21: qty 1, 90d supply, fill #2

## 2021-10-02 NOTE — Telephone Encounter (Signed)
Pt has the Dexcom sensor / pt needs an Rx for the transmitter / it will expire next week / This is the first time Dr. Ancil Boozer will be sending RX for the transmitter (which expires every 3 months) / please advise and send to  [place] Phone:  [phone]  Fax:  [phone]

## 2021-10-05 ENCOUNTER — Other Ambulatory Visit: Payer: Self-pay

## 2021-10-06 ENCOUNTER — Other Ambulatory Visit: Payer: Self-pay

## 2021-10-06 ENCOUNTER — Telehealth: Payer: Self-pay

## 2021-10-06 NOTE — Telephone Encounter (Signed)
Pharmacy notified, they state will call ins

## 2021-10-06 NOTE — Telephone Encounter (Signed)
Per cover my meds:  Information regarding your request Member has an active PA on file which is expiring on 05/18/2022 and has 4 no. of fills remaining

## 2021-10-06 NOTE — Telephone Encounter (Signed)
Copied from CRM [id number]. Topic: General - Other >> Oct 06, 2021  9:25 AM Marylen Ponto wrote: Reason for CRM: Pt stated he was told by his pharmacy that they were still waiting to hear back from the office regarding Rx for [facility] Transmitter. Pt requests call back with an update.

## 2021-10-20 ENCOUNTER — Other Ambulatory Visit: Payer: Self-pay | Admitting: Family Medicine

## 2021-10-20 ENCOUNTER — Other Ambulatory Visit: Payer: Self-pay

## 2021-10-20 DIAGNOSIS — N521 Erectile dysfunction due to diseases classified elsewhere: Secondary | ICD-10-CM

## 2021-10-20 MED ORDER — TRESIBA FLEXTOUCH 200 UNIT/ML ~~LOC~~ SOPN
PEN_INJECTOR | SUBCUTANEOUS | 2 refills | Status: DC
  Filled 2021-10-20: qty 9, 50d supply, fill #0
  Filled 2021-12-13: qty 9, 50d supply, fill #1
  Filled 2022-01-30: qty 9, 50d supply, fill #2

## 2021-10-20 NOTE — Telephone Encounter (Signed)
Requested Prescriptions  Pending Prescriptions Disp Refills   insulin degludec (TRESIBA FLEXTOUCH) 200 UNIT/ML FlexTouch Pen 9 mL 2    Sig: INJECT 36 UNITS INTO THE SKIN DAILY.     Endocrinology:  Diabetes - Insulins Passed - 10/20/2021  6:19 AM      Passed - HBA1C is between 0 and 7.9 and within 180 days    Hemoglobin A1C  Date Value Ref Range Status  08/26/2021 7.0 (A) 4.0 - 5.6 % Final  04/10/2012 6.2 4.2 - 6.3 % Final    Comment:    The American Diabetes Association recommends that a primary goal of therapy should be <7% and that physicians should reevaluate the treatment regimen in patients with HbA1c values consistently >8%.    HbA1c, POC (controlled diabetic range)  Date Value Ref Range Status  11/21/2019 7.9 (A) 0.0 - 7.0 % Final   Hgb A1c MFr Bld  Date Value Ref Range Status  02/16/2021 7.0 (H) <5.7 % of total Hgb Final    Comment:    For someone without known diabetes, a hemoglobin A1c value of 6.5% or greater indicates that they may have  diabetes and this should be confirmed with a follow-up  test. . For someone with known diabetes, a value <7% indicates  that their diabetes is well controlled and a value  greater than or equal to 7% indicates suboptimal  control. A1c targets should be individualized based on  duration of diabetes, age, comorbid conditions, and  other considerations. . Currently, no consensus exists regarding use of hemoglobin A1c for diagnosis of diabetes for children. Verna Czech - Valid encounter within last 6 months    Recent Outpatient Visits          1 month ago Type 1 diabetes mellitus with neuropathy causing erectile dysfunction [facility])   [facility] [name], MD   5 months ago Type 1 diabetes mellitus with neuropathy causing erectile dysfunction [facility])   [facility] [name], MD   6 months ago Eustachian tube disorder, right   [facility] [name], MD   6 months ago Subacute maxillary sinusitis   [facility] [name], MD   8 months ago Well adult health check   [facility] [name], MD      Future Appointments            In 1 month [name], MD [facility], [facility]

## 2021-10-30 ENCOUNTER — Other Ambulatory Visit: Payer: Self-pay

## 2021-10-30 ENCOUNTER — Other Ambulatory Visit: Payer: Self-pay | Admitting: Family Medicine

## 2021-10-30 DIAGNOSIS — N521 Erectile dysfunction due to diseases classified elsewhere: Secondary | ICD-10-CM

## 2021-10-30 DIAGNOSIS — E1049 Type 1 diabetes mellitus with other diabetic neurological complication: Secondary | ICD-10-CM

## 2021-10-30 MED ORDER — INSULIN LISPRO (1 UNIT DIAL) 100 UNIT/ML (KWIKPEN)
PEN_INJECTOR | SUBCUTANEOUS | 0 refills | Status: DC
  Filled 2021-10-30: qty 45, 90d supply, fill #0

## 2021-10-30 NOTE — Telephone Encounter (Signed)
Requested Prescriptions  Pending Prescriptions Disp Refills   insulin lispro (HUMALOG KWIKPEN) 100 UNIT/ML KwikPen 45 mL 2    Sig: INJECT 5 TO 16 UNITS INTO THE SKIN WITH MEALS     Endocrinology:  Diabetes - Insulins Passed - 10/30/2021  6:06 AM      Passed - HBA1C is between 0 and 7.9 and within 180 days    Hemoglobin A1C  Date Value Ref Range Status  08/26/2021 7.0 (A) 4.0 - 5.6 % Final  04/10/2012 6.2 4.2 - 6.3 % Final    Comment:    The American Diabetes Association recommends that a primary goal of therapy should be <7% and that physicians should reevaluate the treatment regimen in patients with HbA1c values consistently >8%.    HbA1c, POC (controlled diabetic range)  Date Value Ref Range Status  11/21/2019 7.9 (A) 0.0 - 7.0 % Final   Hgb A1c MFr Bld  Date Value Ref Range Status  02/16/2021 7.0 (H) <5.7 % of total Hgb Final    Comment:    For someone without known diabetes, a hemoglobin A1c value of 6.5% or greater indicates that they may have  diabetes and this should be confirmed with a follow-up  test. . For someone with known diabetes, a value <7% indicates  that their diabetes is well controlled and a value  greater than or equal to 7% indicates suboptimal  control. A1c targets should be individualized based on  duration of diabetes, age, comorbid conditions, and  other considerations. . Currently, no consensus exists regarding use of hemoglobin A1c for diagnosis of diabetes for children. Renella Cunas - Valid encounter within last 6 months    Recent Outpatient Visits          2 months ago Type 1 diabetes mellitus with neuropathy causing erectile dysfunction [facility])   [facility] Steele Sizer, MD   5 months ago Type 1 diabetes mellitus with neuropathy causing erectile dysfunction [facility])   [facility] Steele Sizer, MD   6 months ago Eustachian tube disorder, right   [facility] Steele Sizer, MD   6 months ago Subacute maxillary sinusitis   [facility] Steele Sizer, MD   8 months ago Well adult health check   [facility] Steele Sizer, MD      Future Appointments            In 1 month Steele Sizer, MD [facility], [facility]

## 2021-11-25 ENCOUNTER — Other Ambulatory Visit: Payer: Self-pay

## 2021-11-25 ENCOUNTER — Other Ambulatory Visit: Payer: Self-pay | Admitting: Family Medicine

## 2021-11-25 DIAGNOSIS — E1069 Type 1 diabetes mellitus with other specified complication: Secondary | ICD-10-CM

## 2021-11-25 DIAGNOSIS — E1049 Type 1 diabetes mellitus with other diabetic neurological complication: Secondary | ICD-10-CM

## 2021-11-25 MED ORDER — FREESTYLE LITE TEST VI STRP
ORAL_STRIP | 2 refills | Status: DC
  Filled 2021-11-25: qty 300, 60d supply, fill #0
  Filled 2022-02-16: qty 300, 60d supply, fill #1
  Filled 2022-05-09: qty 300, 60d supply, fill #2

## 2021-11-25 MED FILL — Insulin Pen Needle 31 G X 8 MM (1/3" or 5/16"): 30 days supply | Qty: 100 | Fill #2 | Status: AC

## 2021-12-03 NOTE — Progress Notes (Addendum)
Name: Ivan Booker   MRN: [id number]    DOB: 30-Aug-1966   Date:12/04/2021       Progress Note  Subjective  Chief Complaint  Follow up   HPI  DMI with ED: he is doing well ,A1C  was  7 %  last visit. He is using Dexcom and it has helped because he sees the trend of glucose going up or down. Today it looked it was going down at 68, we checked and it was 52 and we gave him apple juice and peanut butter crackers before he left glucose went up to 76   No polyphagia, polyuria or polydipsia. ED he is back Revatio. He was diagnosed with DM as an young adult ( mid-20's ) and has good knowledge of his condition.    Blood sugar average on his monitor 143 thirty days and last week average was 133.   BPH: used to see Urologist - Dr. Lonna CobbStoioff - years ago, had normal biopsies in the past. He was on Cialis but stopped working, taking sildenafil and is doing well now - [facility] prescription  [facility] [employer] [facility] [facility] .    Hypogonadism: He is not on testosterone supplementation anymore because insurance denied coverage.  Energy level continues to be good, libido is good   Unchanged   Hyperlipidemia: at goal, taking Atorvastatin every day, No complaints of cramping or chest pain. LDL was 74 last time , He continues to take the Atorvastatin and tolerating it well.  LDL is close to goal, and we tried to increase to 80 mg but he could not tolerate it and is back to 40 mg due to muscle aches. We will recheck labs in June and if needed we will switch to Crestor   The 10-year ASCVD risk score (Arnett DK, et al., 2019) is: 7.7%   Values used to calculate the score:     Age: [age] years     Sex: Male     Is Non-Hispanic African American: No     Diabetic: Yes     Tobacco smoker: No     Systolic Blood Pressure: 114 mmHg     Is BP treated: No     HDL Cholesterol: 48 mg/dL     Total Cholesterol: 145 mg/dL    Vitamin D deficiency: He is taking his vitamin D supplement daily.   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) 03/07/2015   Dyslipidemia 03/07/2015   Hypogonadism male 03/07/2015   Obesity (BMI 30.0-34.9) 03/07/2015   Allergic rhinitis, seasonal 03/07/2015   Vitamin D deficiency 03/07/2015    Past Surgical History:  Procedure Laterality Date   VASECTOMY  1993    Family History  Problem Relation Age of Onset   COPD Father        Smoker    Bladder Cancer Neg Hx    Kidney cancer Neg Hx    Prostate cancer Neg Hx     Social History   Tobacco Use   Smoking status: Never   Smokeless tobacco: Never  Substance Use Topics   Alcohol use: Yes    Comment: 1-2 beers on weekends     Current Outpatient Medications:    aspirin 81 MG chewable tablet, Chew 1 tablet by mouth daily., Disp: , Rfl:    atorvastatin (LIPITOR) 20 MG tablet, 40 mg,  Disp: , Rfl:    atorvastatin (LIPITOR) 80 MG tablet, Take 1 tablet (80 mg total) by mouth daily., Disp: 90 tablet, Rfl: 3   azelastine (ASTELIN) 0.1 % nasal spray, Instill 2 sprays in each nostril twice daily, Disp: 30 mL, Rfl: 12   Cholecalciferol (VITAMIN D) 2000 UNITS CAPS, Take 1 capsule by mouth daily., Disp: , Rfl:    Continuous Blood Gluc Sensor (DEXCOM G6 SENSOR) MISC, 1 each by Does not apply route as directed. Every ten days, Disp: 3 each, Rfl: 5   Continuous Blood Gluc Transmit (DEXCOM G6 TRANSMITTER) MISC, 1 each by Does not apply route daily., Disp: 1 each, Rfl: 2   fluticasone (FLONASE) 50 MCG/ACT nasal spray, 2 sprays in each nostril once daily, Disp: 16 g, Rfl: 12   Glucagon (GVOKE HYPOPEN 1-PACK) 1 MG/0.2ML SOAJ, Inject 1 mg into the skin daily as needed. May repeat in 15 minutes, Disp: 0.4 mL, Rfl: 1   glucose blood (FREESTYLE LITE) test strip, CHECK FASTING BLOOD SUGARS 5 TIMES DAILY, Disp: 300 strip, Rfl: 2   insulin degludec (TRESIBA FLEXTOUCH) 200 UNIT/ML  FlexTouch Pen, INJECT 36 UNITS INTO THE SKIN DAILY., Disp: 9 mL, Rfl: 2   insulin lispro (HUMALOG KWIKPEN) 100 UNIT/ML KwikPen, INJECT 5 TO 16 UNITS INTO THE SKIN WITH MEALS, Disp: 45 mL, Rfl: 0   Insulin Pen Needle (UNIFINE PENTIPS) 31G X 8 MM MISC, USE AS DIRECTED, Disp: 100 each, Rfl: 3   Lancets (FREESTYLE) lancets, Use as instructed, Disp: 100 each, Rfl: 12   sildenafil (REVATIO) 20 MG tablet, TAKE 1 TO 2 TABLETS BY MOUTH DAILY AS NEEDED, Disp: 60 tablet, Rfl: 2   sildenafil (REVATIO) 20 MG tablet, Take by mouth., Disp: , Rfl:   No Known Allergies  I personally reviewed active problem list, medication list, allergies, family history, social history with the patient/caregiver today.   ROS  Constitutional: Negative for fever or weight change.  Respiratory: Negative for cough and shortness of breath.   Cardiovascular: Negative for chest pain or palpitations.  Gastrointestinal: Negative for abdominal pain, no bowel changes.  Musculoskeletal: Negative for gait problem or joint swelling.  Skin: Negative for rash.  Neurological: Negative for dizziness or headache.  No other specific complaints in a complete review of systems (except as listed in HPI above).   Objective  Vitals:   12/04/21 1313  BP: 114/68  Pulse: 72  Resp: 16  Temp: 97.8 F (36.6 C)  TempSrc: Oral  SpO2: 96%  Weight: 234 lb 8 oz (106.4 kg)  Height: 6\' 2"  (1.88 m)    Body mass index is 30.11 kg/m.  Physical Exam  Constitutional: Patient appears well-developed and well-nourished. Obese  No distress.  HEENT: head atraumatic, normocephalic, pupils equal and reactive to light, neck supple Cardiovascular: Normal rate, regular rhythm and normal heart sounds.  No murmur heard. No BLE edema. Pulmonary/Chest: Effort normal and breath sounds normal. No respiratory distress. Abdominal: Soft.  There is no tenderness. Psychiatric: Patient has a normal mood and affect. behavior is normal. Judgment and thought content  normal.   Recent Results (from the past 2160 hour(s))  POCT Glucose (CBG)     Status: Abnormal   Collection Time: 12/04/21  1:34 PM  Result Value Ref Range   POC Glucose 52 (A) 70 - 99 mg/dl    Diabetic Foot Exam: Diabetic Foot Exam - Simple   Simple Foot Form Visual Inspection No deformities, no ulcerations, no other skin breakdown bilaterally: Yes Sensation Testing Intact to  touch and monofilament testing bilaterally: Yes Pulse Check Posterior Tibialis and Dorsalis pulse intact bilaterally: Yes Comments      PHQ2/9: Depression screen [facility] 2/9 12/04/2021 08/26/2021 05/19/2021 04/22/2021 04/10/2021  Decreased Interest 0 0 0 0 0  Down, Depressed, Hopeless 0 0 0 0 0  PHQ - 2 Score 0 0 0 0 0  Altered sleeping 0 0 - - -  Tired, decreased energy 0 0 - - -  Change in appetite 0 0 - - -  Feeling bad or failure about yourself  0 0 - - -  Trouble concentrating 0 0 - - -  Moving slowly or fidgety/restless 0 0 - - -  Suicidal thoughts 0 0 - - -  PHQ-9 Score 0 0 - - -  Difficult doing work/chores Not difficult at all - - - -  Some recent data might be hidden    phq 9 is negative   Fall Risk: Fall Risk  12/04/2021 08/26/2021 05/19/2021 04/22/2021 04/10/2021  Falls in the past year? 0 0 0 0 0  Number falls in past yr: 0 0 0 0 0  Comment - - - - -  Injury with Fall? 0 0 0 0 0  Comment - - - - -  Risk for fall due to : No Fall Risks No Fall Risks No Fall Risks - -  Follow up Falls prevention discussed Falls prevention discussed Falls prevention discussed - -     Functional Status Survey: Is the patient deaf or have difficulty hearing?: No Does the patient have difficulty seeing, even when wearing glasses/contacts?: No Does the patient have difficulty concentrating, remembering, or making decisions?: No Does the patient have difficulty walking or climbing stairs?: No Does the patient have difficulty dressing or bathing?: No Does the patient have difficulty doing errands alone such  as visiting a doctor's office or shopping?: No    Assessment & Plan  1. 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 LADA (latent autoimmune diabetes in adults), managed as type 1 (HCC)   3. Vitamin D deficiency   4. Hypogonadism male   5. Benign prostatic hyperplasia with urinary frequency   6. Dyslipidemia

## 2021-12-04 ENCOUNTER — Other Ambulatory Visit: Payer: Self-pay

## 2021-12-04 ENCOUNTER — Ambulatory Visit (INDEPENDENT_AMBULATORY_CARE_PROVIDER_SITE_OTHER): Payer: No Typology Code available for payment source | Admitting: Family Medicine

## 2021-12-04 ENCOUNTER — Encounter: Payer: Self-pay | Admitting: Family Medicine

## 2021-12-04 VITALS — BP 114/68 | HR 72 | Temp 97.8°F | Resp 16 | Ht 74.0 in | Wt 234.5 lb

## 2021-12-04 DIAGNOSIS — E559 Vitamin D deficiency, unspecified: Secondary | ICD-10-CM | POA: Diagnosis not present

## 2021-12-04 DIAGNOSIS — E139 Other specified diabetes mellitus without complications: Secondary | ICD-10-CM | POA: Diagnosis not present

## 2021-12-04 DIAGNOSIS — N401 Enlarged prostate with lower urinary tract symptoms: Secondary | ICD-10-CM

## 2021-12-04 DIAGNOSIS — E162 Hypoglycemia, unspecified: Secondary | ICD-10-CM

## 2021-12-04 DIAGNOSIS — N521 Erectile dysfunction due to diseases classified elsewhere: Secondary | ICD-10-CM | POA: Diagnosis not present

## 2021-12-04 DIAGNOSIS — E1049 Type 1 diabetes mellitus with other diabetic neurological complication: Secondary | ICD-10-CM | POA: Diagnosis not present

## 2021-12-04 DIAGNOSIS — R350 Frequency of micturition: Secondary | ICD-10-CM

## 2021-12-04 DIAGNOSIS — E291 Testicular hypofunction: Secondary | ICD-10-CM

## 2021-12-04 DIAGNOSIS — E785 Hyperlipidemia, unspecified: Secondary | ICD-10-CM

## 2021-12-04 LAB — HM DIABETES EYE EXAM

## 2021-12-04 LAB — GLUCOSE, POCT (MANUAL RESULT ENTRY): POC Glucose: 52 mg/dl — AB (ref 70–99)

## 2021-12-04 NOTE — Patient Instructions (Signed)
[facility]

## 2021-12-05 LAB — HEMOGLOBIN A1C
Hgb A1c MFr Bld: 7.1 % of total Hgb — ABNORMAL HIGH (ref <5.7)
Mean Plasma Glucose: 157 mg/dL
eAG (mmol/L): 8.7 mmol/L

## 2021-12-14 ENCOUNTER — Other Ambulatory Visit: Payer: Self-pay

## 2021-12-28 ENCOUNTER — Other Ambulatory Visit: Payer: Self-pay | Admitting: Family Medicine

## 2021-12-28 ENCOUNTER — Other Ambulatory Visit: Payer: Self-pay

## 2021-12-28 DIAGNOSIS — E1049 Type 1 diabetes mellitus with other diabetic neurological complication: Secondary | ICD-10-CM

## 2021-12-28 MED ORDER — DEXCOM G6 SENSOR MISC
1.0000 | 5 refills | Status: DC
  Filled 2021-12-28: qty 3, 30d supply, fill #0
  Filled 2022-01-30: qty 3, 30d supply, fill #1
  Filled 2022-02-22: qty 3, 30d supply, fill #2
  Filled 2022-03-21: qty 3, 30d supply, fill #3
  Filled 2022-04-18: qty 3, 30d supply, fill #4
  Filled 2022-05-15: qty 3, 30d supply, fill #5

## 2021-12-29 ENCOUNTER — Other Ambulatory Visit: Payer: Self-pay

## 2021-12-30 ENCOUNTER — Other Ambulatory Visit: Payer: Self-pay

## 2021-12-30 ENCOUNTER — Other Ambulatory Visit: Payer: Self-pay | Admitting: Family Medicine

## 2022-01-08 ENCOUNTER — Other Ambulatory Visit: Payer: Self-pay

## 2022-01-11 ENCOUNTER — Other Ambulatory Visit: Payer: Self-pay

## 2022-01-13 ENCOUNTER — Other Ambulatory Visit: Payer: Self-pay

## 2022-01-14 ENCOUNTER — Other Ambulatory Visit: Payer: Self-pay

## 2022-01-14 ENCOUNTER — Other Ambulatory Visit: Payer: Self-pay | Admitting: Family Medicine

## 2022-01-15 ENCOUNTER — Other Ambulatory Visit: Payer: Self-pay

## 2022-02-01 ENCOUNTER — Other Ambulatory Visit: Payer: Self-pay

## 2022-02-04 ENCOUNTER — Other Ambulatory Visit: Payer: Self-pay | Admitting: Family Medicine

## 2022-02-12 NOTE — Progress Notes (Signed)
Name: Ivan Booker   MRN: [id number]    DOB: 23-Nov-1965   Date:02/15/2022       Progress Note  Subjective  Chief Complaint  Chief Complaint  Patient presents with   Follow-up    HPI  Patient presents for annual CPE .  IPSS Questionnaire (AUA-7): Over the past month.   1)  How often have you had a sensation of not emptying your bladder completely after you finish urinating?  0 - Not at all  2)  How often have you had to urinate again less than two hours after you finished urinating? 0 - Not at all  3)  How often have you found you stopped and started again several times when you urinated?  0 - Not at all  4) How difficult have you found it to postpone urination?  0 - Not at all  5) How often have you had a weak urinary stream?  0 - Not at all  6) How often have you had to push or strain to begin urination?  0 - Not at all  7) How many times did you most typically get up to urinate from the time you went to bed until the time you got up in the morning?  1 - 1 time  Total score:  0-7 mildly symptomatic   8-19 moderately symptomatic   20-35 severely symptomatic     Diet: he follows a balanced  Exercise: continue regular physical activity   Depression: phq 9 is negative    02/15/2022    2:52 PM 12/04/2021    1:08 PM 08/26/2021    9:47 AM 05/19/2021    3:04 PM 04/22/2021    2:44 PM  Depression screen PHQ 2/9  Decreased Interest 0 0 0 0 0  Down, Depressed, Hopeless 0 0 0 0 0  PHQ - 2 Score 0 0 0 0 0  Altered sleeping 0 0 0    Tired, decreased energy 0 0 0    Change in appetite 0 0 0    Feeling bad or failure about yourself  0 0 0    Trouble concentrating 0 0 0    Moving slowly or fidgety/restless 0 0 0    Suicidal thoughts 0 0 0    PHQ-9 Score 0 0 0    Difficult doing work/chores  Not difficult at all       Hypertension:  BP Readings from Last 3 Encounters:  02/15/22 110/62  12/04/21 114/68  08/26/21 132/84    Obesity: Wt Readings from Last 3 Encounters:   02/15/22 234 lb (106.1 kg)  12/04/21 234 lb 8 oz (106.4 kg)  08/26/21 233 lb (105.7 kg)   BMI Readings from Last 3 Encounters:  02/15/22 30.04 kg/m  12/04/21 30.11 kg/m  08/26/21 29.92 kg/m     Lipids:  Lab Results  Component Value Date   CHOL 145 02/16/2021   CHOL 154 02/20/2020   CHOL 149 02/12/2019   Lab Results  Component Value Date   HDL 48 02/16/2021   HDL 48 02/20/2020   HDL 47 02/12/2019   Lab Results  Component Value Date   LDLCALC 74 02/16/2021   LDLCALC 83 02/20/2020   LDLCALC 82 02/12/2019   Lab Results  Component Value Date   TRIG 148 02/16/2021   TRIG 124 02/20/2020   TRIG 104 02/12/2019   Lab Results  Component Value Date   CHOLHDL 3.0 02/16/2021   CHOLHDL 3.2 02/20/2020   CHOLHDL 3.2 02/12/2019  No results found for: LDLDIRECT Glucose:  Glucose, Bld  Date Value Ref Range Status  02/16/2021 155 (H) 65 - 139 mg/dL Final    Comment:    .        Non-fasting reference interval .   02/20/2020 216 (H) 65 - 99 mg/dL Final    Comment:    .            Fasting reference interval . For someone without known diabetes, a glucose value >125 mg/dL indicates that they may have diabetes and this should be confirmed with a follow-up test. .   02/12/2019 99 65 - 99 mg/dL Final    Comment:    .            Fasting reference interval .    Glucose-Capillary  Date Value Ref Range Status  03/02/2015 185 (H) 65 - 99 mg/dL Final    Flowsheet Row [facility] Visit from 07/28/2020 in [facility]  AUDIT-C Score 0        Married STD testing and prevention (HIV/chl/gon/syphilis):  not applicable Sexual history: married, one partner, taking viagra and it works well for him  Hep C Screening: up to date  Skin cancer: Discussed monitoring for atypical lesions Colorectal cancer: repeat in 2025  Prostate cancer:  discussed USPTF   Lab Results  Component Value Date   PSA 0.45 02/16/2021   PSA 0.4 02/20/2020   PSA 0.6 02/12/2019      Lung cancer:  Low Dose CT Chest recommended if Age 14-80 years, 30 pack-year currently smoking OR have quit w/in 15years. Patient  not applicable a candidate for screening   AAA: The USPSTF recommends one-time screening with ultrasonography in men ages 59 to 31 years who have ever smoked. Patient   not applicable, a candidate for screening  ECG:  2016  Vaccines:   Tdap: is up to date  Shingrix: up to date  Pneumonia: is up to date  Flu: up to date  COVID-9:not interested   [place]: A voluntary discussion about advance care planning including the explanation and discussion of advance directives.  Discussed health care proxy and Living will, and the patient was able to identify a health care proxy as wife.  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 ([place]) 03/07/2015   Dyslipidemia 03/07/2015   Hypogonadism male 03/07/2015   Obesity (BMI 30.0-34.9) 03/07/2015   Allergic rhinitis, seasonal 03/07/2015   Vitamin D deficiency 03/07/2015    Past Surgical History:  Procedure Laterality Date   VASECTOMY  1993    Family History  Problem Relation Age of Onset   COPD Father        Smoker    Bladder Cancer Neg Hx    Kidney cancer Neg Hx    Prostate cancer Neg Hx     Social History   Socioeconomic History   Marital status: Married    Spouse name: Shirlean Mylar   Number of children: 5   Years of education: Not on file   Highest education level: High school graduate  Occupational History   Not on file  Tobacco Use   Smoking status: Never   Smokeless tobacco: Never  Vaping Use   Vaping Use: Never used  Substance and Sexual  Activity   Alcohol use: Yes    Comment: 1-2 beers on weekends   Drug use: No   Sexual activity: Yes     Partners: Female    Birth control/protection: None  Other Topics Concern   Not on file  Social History Narrative   Not on file   Social Determinants of Health   Financial Resource Strain: Low Risk    Difficulty of Paying Living Expenses: Not hard at all  Food Insecurity: No Food Insecurity   Worried About Programme researcher, broadcasting/film/video in the Last Year: Never true   Ran Out of Food in the Last Year: Never true  Transportation Needs: No Transportation Needs   Lack of Transportation (Medical): No   Lack of Transportation (Non-Medical): No  Physical Activity: Sufficiently Active   Days of Exercise per Week: 6 days   Minutes of Exercise per Session: 60 min  Stress: No Stress Concern Present   Feeling of Stress : Not at all  Social Connections: Socially Integrated   Frequency of Communication with Friends and Family: More than three times a week   Frequency of Social Gatherings with Friends and Family: Twice a week   Attends Religious Services: More than 4 times per year   Active Member of [employer] or Organizations: Yes   Attends Banker Meetings: Never   Marital Status: Married  Catering manager Violence: Not At Risk   Fear of Current or Ex-Partner: No   Emotionally Abused: No   Physically Abused: No   Sexually Abused: No     Current Outpatient Medications:    aspirin 81 MG chewable tablet, Chew 1 tablet by mouth daily., Disp: , Rfl:    atorvastatin (LIPITOR) 20 MG tablet, 40 mg, Disp: , Rfl:    atorvastatin (LIPITOR) 80 MG tablet, Take 1 tablet (80 mg total) by mouth daily., Disp: 90 tablet, Rfl: 3   azelastine (ASTELIN) 0.1 % nasal spray, Instill 2 sprays in each nostril twice daily, Disp: 30 mL, Rfl: 12   Cholecalciferol (VITAMIN D) 2000 UNITS CAPS, Take 1 capsule by mouth daily., Disp: , Rfl:    Continuous Blood Gluc Sensor (DEXCOM G6 SENSOR) MISC, 1 each by Does not apply route as directed. Every ten days, Disp: 3 each, Rfl: 5   Continuous Blood Gluc Transmit (DEXCOM G6  TRANSMITTER) MISC, 1 each by Does not apply route daily., Disp: 1 each, Rfl: 2   fluticasone (FLONASE) 50 MCG/ACT nasal spray, 2 sprays in each nostril once daily, Disp: 16 g, Rfl: 12   Glucagon (GVOKE HYPOPEN 1-PACK) 1 MG/0.2ML SOAJ, Inject 1 mg into the skin daily as needed. May repeat in 15 minutes, Disp: 0.4 mL, Rfl: 1   glucose blood (FREESTYLE LITE) test strip, CHECK FASTING BLOOD SUGARS 5 TIMES DAILY, Disp: 300 strip, Rfl: 2   insulin degludec (TRESIBA FLEXTOUCH) 200 UNIT/ML FlexTouch Pen, INJECT 36 UNITS INTO THE SKIN DAILY., Disp: 9 mL, Rfl: 2   insulin lispro (HUMALOG KWIKPEN) 100 UNIT/ML KwikPen, INJECT 5 TO 16 UNITS INTO THE SKIN WITH MEALS, Disp: 45 mL, Rfl: 0   Insulin Pen Needle (UNIFINE PENTIPS) 31G X 8 MM MISC, USE AS DIRECTED, Disp: 100 each, Rfl: 3   Lancets (FREESTYLE) lancets, Use as instructed, Disp: 100 each, Rfl: 12   sildenafil (REVATIO) 20 MG tablet, TAKE ONE TO TWO TABLETS BY MOUTH EVERY DAY AS NEEDED, Disp: 60 tablet, Rfl: 0  No Known Allergies   ROS  Constitutional: Negative for fever or weight change.  Respiratory: Negative for cough and shortness of breath.   Cardiovascular: Negative for chest pain or palpitations.  Gastrointestinal: Negative for abdominal pain, no bowel changes.  Musculoskeletal: Negative for gait problem or joint swelling.  Skin: Negative for rash.  Neurological: Negative for dizziness or headache.  No other specific complaints in a complete review of systems (except as listed in HPI above).    Objective  Vitals:   02/15/22 1452  BP: 110/62  Pulse: 72  Resp: 16  SpO2: 96%  Weight: 234 lb (106.1 kg)  Height: 6\' 2"  (1.88 m)    Body mass index is 30.04 kg/m.  Physical Exam  Constitutional: Patient appears well-developed and well-nourished. No distress.  HENT: Head: Normocephalic and atraumatic. Ears: B TMs ok, no erythema or effusion; Nose: Nose normal. Mouth/Throat: Oropharynx is clear and moist. No oropharyngeal exudate.   Eyes: Conjunctivae and EOM are normal. Pupils are equal, round, and reactive to light. No scleral icterus.  Neck: Normal range of motion. Neck supple. No JVD present. No thyromegaly present.  Cardiovascular: Normal rate, regular rhythm and normal heart sounds.  No murmur heard. No BLE edema. Pulmonary/Chest: Effort normal and breath sounds normal. No respiratory distress. Abdominal: Soft. Bowel sounds are normal, no distension. There is no tenderness. no masses MALE GENITALIA: Normal descended testes bilaterally, no masses palpated, no hernias, no lesions, no discharge  RECTAL: Prostate normal size and consistency, no rectal masses , small hemorrhoid at 6 oc'clock Musculoskeletal: Normal range of motion, no joint effusions. No gross deformities Neurological: he is alert and oriented to person, place, and time. No cranial nerve deficit. Coordination, balance, strength, speech and gait are normal.  Skin: Skin is warm and dry. No rash noted. No erythema.  Psychiatric: Patient has a normal mood and affect. behavior is normal. Judgment and thought content normal.   Recent Results (from the past 2160 hour(s))  HM DIABETES EYE EXAM     Status: None   Collection Time: 12/04/21 12:00 AM  Result Value Ref Range   HM Diabetic Eye Exam No Retinopathy No Retinopathy    Comment: [facility]  POCT Glucose (CBG)     Status: Abnormal   Collection Time: 12/04/21  1:34 PM  Result Value Ref Range   POC Glucose 52 (A) 70 - 99 mg/dl  HgB A1c     Status: Abnormal   Collection Time: 12/04/21  2:02 PM  Result Value Ref Range   Hgb A1c MFr Bld 7.1 (H) <5.7 % of total Hgb    Comment: For someone without known diabetes, a hemoglobin A1c value of 6.5% or greater indicates that they may have  diabetes and this should be confirmed with a follow-up  test. . For someone with known diabetes, a value <7% indicates  that their diabetes is well controlled and a value  greater than or equal to 7% indicates suboptimal   control. A1c targets should be individualized based on  duration of diabetes, age, comorbid conditions, and  other considerations. . Currently, no consensus exists regarding use of hemoglobin A1c for diagnosis of diabetes for children. .    Mean Plasma Glucose 157 mg/dL   eAG (mmol/L) 8.7 mmol/L     Fall Risk:    02/15/2022    2:51 PM 12/04/2021    1:08 PM 08/26/2021    9:47 AM 05/19/2021    3:04 PM 04/22/2021    2:44 PM  Fall Risk   Falls in the past year? 0 0 0 0 0  Number falls  in past yr: 0 0 0 0 0  Injury with Fall? 0 0 0 0 0  Risk for fall due to : No Fall Risks No Fall Risks No Fall Risks No Fall Risks   Follow up Falls prevention discussed Falls prevention discussed Falls prevention discussed Falls prevention discussed      Functional Status Survey: Is the patient deaf or have difficulty hearing?: No Does the patient have difficulty seeing, even when wearing glasses/contacts?: No Does the patient have difficulty concentrating, remembering, or making decisions?: No Does the patient have difficulty walking or climbing stairs?: No Does the patient have difficulty dressing or bathing?: No Does the patient have difficulty doing errands alone such as visiting a doctor's office or shopping?: No    Assessment & Plan  1. 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 Benign prostatic hyperplasia with urinary frequency  - PSA  3. Dyslipidemia  - Lipid panel  4. Long-term use of high-risk medication  - CBC with Differential/Platelet - COMPLETE METABOLIC PANEL WITH GFR  5. Prostate cancer screening   6. Well adult exam  - Lipid panel - Microalbumin / creatinine urine ratio - CBC with Differential/Platelet - COMPLETE METABOLIC PANEL WITH GFR - Hemoglobin A1c - PSA    -Prostate cancer screening and PSA options (with potential risks and benefits of  testing vs not testing) were discussed along with recent recs/guidelines. -USPSTF grade A and B recommendations reviewed with patient; age-appropriate recommendations, preventive care, screening tests, etc discussed and encouraged; healthy living encouraged; see AVS for patient education given to patient -Discussed importance of 150 minutes of physical activity weekly, eat two servings of fish weekly, eat one serving of tree nuts ( cashews, pistachios, pecans, almonds.Marland Kitchen) every other day, eat 6 servings of fruit/vegetables daily and drink plenty of water and avoid sweet beverages.  -Reviewed Health Maintenance: not applicable

## 2022-02-15 ENCOUNTER — Ambulatory Visit (INDEPENDENT_AMBULATORY_CARE_PROVIDER_SITE_OTHER): Payer: BC Managed Care – PPO | Admitting: Family Medicine

## 2022-02-15 ENCOUNTER — Encounter: Payer: Self-pay | Admitting: Family Medicine

## 2022-02-15 VITALS — BP 110/62 | HR 72 | Resp 16 | Ht 74.0 in | Wt 234.0 lb

## 2022-02-15 DIAGNOSIS — Z125 Encounter for screening for malignant neoplasm of prostate: Secondary | ICD-10-CM

## 2022-02-15 DIAGNOSIS — E785 Hyperlipidemia, unspecified: Secondary | ICD-10-CM | POA: Diagnosis not present

## 2022-02-15 DIAGNOSIS — E1049 Type 1 diabetes mellitus with other diabetic neurological complication: Secondary | ICD-10-CM

## 2022-02-15 DIAGNOSIS — N521 Erectile dysfunction due to diseases classified elsewhere: Secondary | ICD-10-CM

## 2022-02-15 DIAGNOSIS — N401 Enlarged prostate with lower urinary tract symptoms: Secondary | ICD-10-CM | POA: Diagnosis not present

## 2022-02-15 DIAGNOSIS — Z0000 Encounter for general adult medical examination without abnormal findings: Secondary | ICD-10-CM

## 2022-02-15 DIAGNOSIS — Z79899 Other long term (current) drug therapy: Secondary | ICD-10-CM | POA: Diagnosis not present

## 2022-02-15 DIAGNOSIS — R350 Frequency of micturition: Secondary | ICD-10-CM

## 2022-02-16 ENCOUNTER — Other Ambulatory Visit: Payer: Self-pay

## 2022-02-16 MED FILL — Insulin Pen Needle 31 G X 8 MM (1/3" or 5/16"): 30 days supply | Qty: 100 | Fill #3 | Status: AC

## 2022-02-18 ENCOUNTER — Other Ambulatory Visit: Payer: Self-pay

## 2022-02-22 ENCOUNTER — Other Ambulatory Visit: Payer: Self-pay | Admitting: Family Medicine

## 2022-02-22 DIAGNOSIS — E1049 Type 1 diabetes mellitus with other diabetic neurological complication: Secondary | ICD-10-CM

## 2022-02-23 ENCOUNTER — Other Ambulatory Visit: Payer: Self-pay

## 2022-02-23 MED ORDER — INSULIN LISPRO (1 UNIT DIAL) 100 UNIT/ML (KWIKPEN)
PEN_INJECTOR | SUBCUTANEOUS | 0 refills | Status: DC
  Filled 2022-02-23: qty 45, 90d supply, fill #0

## 2022-02-24 ENCOUNTER — Other Ambulatory Visit: Payer: Self-pay

## 2022-03-08 ENCOUNTER — Other Ambulatory Visit: Payer: Self-pay | Admitting: Family Medicine

## 2022-03-08 DIAGNOSIS — Z79899 Other long term (current) drug therapy: Secondary | ICD-10-CM | POA: Diagnosis not present

## 2022-03-08 DIAGNOSIS — E1049 Type 1 diabetes mellitus with other diabetic neurological complication: Secondary | ICD-10-CM | POA: Diagnosis not present

## 2022-03-08 DIAGNOSIS — R350 Frequency of micturition: Secondary | ICD-10-CM | POA: Diagnosis not present

## 2022-03-08 DIAGNOSIS — E785 Hyperlipidemia, unspecified: Secondary | ICD-10-CM | POA: Diagnosis not present

## 2022-03-09 LAB — MICROALBUMIN / CREATININE URINE RATIO
Creatinine, Urine: 144 mg/dL (ref 20–320)
Microalb Creat Ratio: 1 mcg/mg creat (ref <30)
Microalb, Ur: 0.2 mg/dL

## 2022-03-09 LAB — CBC WITH DIFFERENTIAL/PLATELET
Absolute Monocytes: 798 cells/uL (ref 200–950)
Basophils Absolute: 79 cells/uL (ref 0–200)
Basophils Relative: 1 %
Eosinophils Absolute: 253 cells/uL (ref 15–500)
Eosinophils Relative: 3.2 %
HCT: 46.5 % (ref 38.5–50.0)
Hemoglobin: 16.1 g/dL (ref 13.2–17.1)
Lymphs Abs: 2512 cells/uL (ref 850–3900)
MCH: 30.3 pg (ref 27.0–33.0)
MCHC: 34.6 g/dL (ref 32.0–36.0)
MCV: 87.4 fL (ref 80.0–100.0)
MPV: 10 fL (ref 7.5–12.5)
Monocytes Relative: 10.1 %
Neutro Abs: 4258 cells/uL (ref 1500–7800)
Neutrophils Relative %: 53.9 %
Platelets: 216 10*3/uL (ref 140–400)
RBC: 5.32 10*6/uL (ref 4.20–5.80)
RDW: 12.5 % (ref 11.0–15.0)
Total Lymphocyte: 31.8 %
WBC: 7.9 10*3/uL (ref 3.8–10.8)

## 2022-03-09 LAB — LIPID PANEL
Cholesterol: 132 mg/dL (ref <200)
HDL: 51 mg/dL (ref >=40)
LDL Cholesterol (Calc): 65 mg/dL (calc)
Non-HDL Cholesterol (Calc): 81 mg/dL (calc) (ref <130)
Total CHOL/HDL Ratio: 2.6 (calc) (ref <5.0)
Triglycerides: 80 mg/dL (ref <150)

## 2022-03-09 LAB — COMPLETE METABOLIC PANEL WITH GFR
AG Ratio: 1.8 (calc) (ref 1.0–2.5)
ALT: 16 U/L (ref 9–46)
AST: 16 U/L (ref 10–35)
Albumin: 4.1 g/dL (ref 3.6–5.1)
Alkaline phosphatase (APISO): 95 U/L (ref 35–144)
BUN: 18 mg/dL (ref 7–25)
CO2: 24 mmol/L (ref 20–32)
Calcium: 9.3 mg/dL (ref 8.6–10.3)
Chloride: 105 mmol/L (ref 98–110)
Creat: 0.94 mg/dL (ref 0.70–1.30)
Globulin: 2.3 g/dL (calc) (ref 1.9–3.7)
Glucose, Bld: 121 mg/dL — ABNORMAL HIGH (ref 65–99)
Potassium: 3.9 mmol/L (ref 3.5–5.3)
Sodium: 139 mmol/L (ref 135–146)
Total Bilirubin: 0.8 mg/dL (ref 0.2–1.2)
Total Protein: 6.4 g/dL (ref 6.1–8.1)
eGFR: 94 mL/min/{1.73_m2} (ref >=60)

## 2022-03-09 LAB — HEMOGLOBIN A1C
Hgb A1c MFr Bld: 6.8 % of total Hgb — ABNORMAL HIGH (ref <5.7)
Mean Plasma Glucose: 148 mg/dL
eAG (mmol/L): 8.2 mmol/L

## 2022-03-09 LAB — PSA: PSA: 0.54 ng/mL (ref <=4.00)

## 2022-03-15 ENCOUNTER — Other Ambulatory Visit: Payer: Self-pay

## 2022-03-22 ENCOUNTER — Other Ambulatory Visit: Payer: Self-pay

## 2022-03-22 ENCOUNTER — Other Ambulatory Visit: Payer: Self-pay | Admitting: Family Medicine

## 2022-03-22 DIAGNOSIS — E1049 Type 1 diabetes mellitus with other diabetic neurological complication: Secondary | ICD-10-CM

## 2022-03-22 MED ORDER — TRESIBA FLEXTOUCH 200 UNIT/ML ~~LOC~~ SOPN
PEN_INJECTOR | SUBCUTANEOUS | 2 refills | Status: DC
  Filled 2022-03-22: qty 9, 50d supply, fill #0
  Filled 2022-05-14: qty 9, 50d supply, fill #1
  Filled 2022-07-04: qty 9, 50d supply, fill #2

## 2022-03-24 ENCOUNTER — Other Ambulatory Visit: Payer: Self-pay

## 2022-03-26 ENCOUNTER — Other Ambulatory Visit: Payer: Self-pay

## 2022-04-06 ENCOUNTER — Other Ambulatory Visit (HOSPITAL_COMMUNITY): Payer: Self-pay

## 2022-04-06 ENCOUNTER — Other Ambulatory Visit: Payer: Self-pay

## 2022-04-06 ENCOUNTER — Other Ambulatory Visit: Payer: Self-pay | Admitting: Family Medicine

## 2022-04-06 DIAGNOSIS — E1049 Type 1 diabetes mellitus with other diabetic neurological complication: Secondary | ICD-10-CM

## 2022-04-06 MED ORDER — UNIFINE PENTIPS 31G X 8 MM MISC
3 refills | Status: DC
  Filled 2022-04-06: qty 100, 30d supply, fill #0
  Filled 2022-06-13: qty 100, 30d supply, fill #1
  Filled 2022-08-22: qty 100, 30d supply, fill #2
  Filled 2022-10-31: qty 100, 30d supply, fill #3

## 2022-04-19 ENCOUNTER — Other Ambulatory Visit: Payer: Self-pay

## 2022-04-21 ENCOUNTER — Other Ambulatory Visit: Payer: Self-pay

## 2022-04-22 ENCOUNTER — Other Ambulatory Visit: Payer: Self-pay

## 2022-04-23 ENCOUNTER — Other Ambulatory Visit: Payer: Self-pay

## 2022-05-09 ENCOUNTER — Other Ambulatory Visit: Payer: Self-pay | Admitting: Family Medicine

## 2022-05-09 ENCOUNTER — Other Ambulatory Visit: Payer: Self-pay

## 2022-05-10 ENCOUNTER — Other Ambulatory Visit: Payer: Self-pay

## 2022-05-14 ENCOUNTER — Other Ambulatory Visit: Payer: Self-pay

## 2022-05-16 ENCOUNTER — Other Ambulatory Visit: Payer: Self-pay

## 2022-05-17 ENCOUNTER — Other Ambulatory Visit: Payer: Self-pay

## 2022-05-17 NOTE — Progress Notes (Unsigned)
Name: Ivan Booker   MRN: [id number]    DOB: August 30, 1966   Date:05/18/2022       Progress Note  Subjective  Chief Complaint  Follow Up  HPI  DMI with ED: he is doing well , A1C is down from 7.1 % to 6.8 %  He is using Dexcom 6  and it has helped because he sees the trend of glucose going up or down.  No polyphagia, polyuria or polydipsia. ED he is back Revatio. He was diagnosed with DM as an young adult ( mid-20's ) and has good knowledge of his condition.    Blood sugar average on his fsbs was 154 in 30 days    BPH: used to see Urologist - Dr. Lonna Cobb - years ago, had normal biopsies in the past. He was on Cialis but stopped working, taking sildenafil and is doing well now - he gets it prescription  from Karin Golden through [facility] due to cost    Hypogonadism: He is not on testosterone supplementation anymore because insurance denied coverage.  Energy level continues to be good, libido also good   Hyperlipidemia: at goal, taking Atorvastatin every day, No complaints of cramping or chest pain. He has been taking 40 mg of atorvastatin, unable to tolerate higher dose and last LDL was at goal - below 70   Vitamin D deficiency: He is taking his vitamin D supplement daily.  Golfer;s elbow: he has some pain last year, but now it is on the lateral elbow, worse when gripping his hand and also playing golf, sometimes has tingling on index finger that seems unrelated. Discussed EMG but for now he wants to take some time off from golfing and we will send topical medication, may use ice  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) 03/07/2015   Dyslipidemia 03/07/2015   Hypogonadism male 03/07/2015   Obesity (BMI 30.0-34.9) 03/07/2015   Allergic rhinitis, seasonal 03/07/2015   Vitamin D deficiency 03/07/2015    Past Surgical  History:  Procedure Laterality Date   VASECTOMY  1993    Family History  Problem Relation Age of Onset   COPD Father        Smoker    Bladder Cancer Neg Hx    Kidney cancer Neg Hx    Prostate cancer Neg Hx     Social History   Tobacco Use   Smoking status: Never   Smokeless tobacco: Never  Substance Use Topics   Alcohol use: Yes    Comment: 1-2 beers on weekends     Current Outpatient Medications:    aspirin 81 MG chewable tablet, Chew 1 tablet by mouth daily., Disp: , Rfl:    atorvastatin (LIPITOR) 20 MG tablet, 40 mg, Disp: , Rfl:    atorvastatin (LIPITOR) 80 MG tablet, Take 1 tablet (80 mg total) by mouth daily., Disp: 90 tablet, Rfl: 3   azelastine (ASTELIN) 0.1 % nasal spray, Instill 2 sprays in each nostril twice daily, Disp: 30 mL, Rfl: 12   Cholecalciferol (VITAMIN D) 2000 UNITS CAPS, Take 1 capsule by mouth daily., Disp: , Rfl:    Continuous Blood Gluc Sensor (DEXCOM G6 SENSOR) MISC, 1 each by Does not apply route as directed. Every ten days, Disp: 3 each, Rfl: 5   Continuous Blood Gluc Transmit (DEXCOM G6 TRANSMITTER) MISC, 1 each by Does not apply  route daily., Disp: 1 each, Rfl: 2   fluticasone (FLONASE) 50 MCG/ACT nasal spray, 2 sprays in each nostril once daily, Disp: 16 g, Rfl: 12   Glucagon (GVOKE HYPOPEN 1-PACK) 1 MG/0.2ML SOAJ, Inject 1 mg into the skin daily as needed. May repeat in 15 minutes, Disp: 0.4 mL, Rfl: 1   glucose blood (FREESTYLE LITE) test strip, CHECK FASTING BLOOD SUGARS 5 TIMES DAILY, Disp: 300 strip, Rfl: 2   insulin degludec (TRESIBA FLEXTOUCH) 200 UNIT/ML FlexTouch Pen, INJECT 36 UNITS INTO THE SKIN DAILY., Disp: 9 mL, Rfl: 2   insulin lispro (HUMALOG KWIKPEN) 100 UNIT/ML KwikPen, INJECT 5 TO 16 UNITS INTO THE SKIN WITH MEALS, Disp: 45 mL, Rfl: 0   Insulin Pen Needle (UNIFINE PENTIPS) 31G X 8 MM MISC, USE AS DIRECTED, Disp: 100 each, Rfl: 3   Lancets (FREESTYLE) lancets, Use as instructed, Disp: 100 each, Rfl: 12   sildenafil (REVATIO) 20  MG tablet, TAKE ONE TO TWO TABLETS BY MOUTH EVERY DAY AS NEEDED, Disp: 60 tablet, Rfl: 0  No Known Allergies  I personally reviewed active problem list, medication list, allergies, family history, social history, health maintenance with the patient/caregiver today.   ROS  Constitutional: Negative for fever or weight change.  Respiratory: Negative for cough and shortness of breath.   Cardiovascular: Negative for chest pain or palpitations.  Gastrointestinal: Negative for abdominal pain, no bowel changes.  Musculoskeletal: Negative for gait problem or joint swelling.  Skin: Negative for rash.  Neurological: Negative for dizziness or headache.  No other specific complaints in a complete review of systems (except as listed in HPI above).   Objective  Vitals:   05/18/22 1449  BP: 118/60  Pulse: 76  Resp: 16  SpO2: 96%  Weight: 237 lb (107.5 kg)  Height: 6\' 2"  (1.88 m)    Body mass index is 30.43 kg/m.  Physical Exam  Constitutional: Patient appears well-developed and well-nourished. Obese  No distress.  HEENT: head atraumatic, normocephalic, pupils equal and reactive to light, neck supple Cardiovascular: Normal rate, regular rhythm and normal heart sounds.  No murmur heard. No BLE edema. Pulmonary/Chest: Effort normal and breath sounds normal. No respiratory distress. Abdominal: Soft.  There is no tenderness. Psychiatric: Patient has a normal mood and affect. behavior is normal. Judgment and thought content normal.   PHQ2/9:    05/18/2022    2:49 PM 02/15/2022    2:52 PM 12/04/2021    1:08 PM 08/26/2021    9:47 AM 05/19/2021    3:04 PM  Depression screen PHQ 2/9  Decreased Interest 0 0 0 0 0  Down, Depressed, Hopeless 0 0 0 0 0  PHQ - 2 Score 0 0 0 0 0  Altered sleeping 0 0 0 0   Tired, decreased energy 0 0 0 0   Change in appetite 0 0 0 0   Feeling bad or failure about yourself  0 0 0 0   Trouble concentrating 0 0 0 0   Moving slowly or fidgety/restless 0 0 0 0    Suicidal thoughts 0 0 0 0   PHQ-9 Score 0 0 0 0   Difficult doing work/chores   Not difficult at all      phq 9 is negative   Fall Risk:    05/18/2022    2:49 PM 02/15/2022    2:51 PM 12/04/2021    1:08 PM 08/26/2021    9:47 AM 05/19/2021    3:04 PM  Fall Risk   Falls  in the past year? 0 0 0 0 0  Number falls in past yr: 0 0 0 0 0  Injury with Fall? 0 0 0 0 0  Risk for fall due to : No Fall Risks No Fall Risks No Fall Risks No Fall Risks No Fall Risks  Follow up Falls prevention discussed Falls prevention discussed Falls prevention discussed Falls prevention discussed Falls prevention discussed      Functional Status Survey: Is the patient deaf or have difficulty hearing?: No Does the patient have difficulty seeing, even when wearing glasses/contacts?: No Does the patient have difficulty concentrating, remembering, or making decisions?: No Does the patient have difficulty walking or climbing stairs?: No Does the patient have difficulty dressing or bathing?: No Does the patient have difficulty doing errands alone such as visiting a doctor's office or shopping?: No    Assessment & Plan  1. 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 Every 10 days  Dispense: 9 each; Refill: 1  2. Need for immunization against influenza  - Flu Vaccine QUAD 6+ mos PF IM (Fluarix Quad PF)  3. LADA (latent autoimmune diabetes in adults), managed as type 1 (HCC)   4. Benign prostatic hyperplasia with urinary frequency   5. Dyslipidemia  Continue crestor   6. Vitamin D deficiency   7. Golfer's elbow, left  - diclofenac Sodium (VOLTAREN) 1 % GEL; Apply 4 g topically 4 (four) times daily.  Dispense: 100 g; Refill: 1

## 2022-05-18 ENCOUNTER — Encounter: Payer: Self-pay | Admitting: Family Medicine

## 2022-05-18 ENCOUNTER — Ambulatory Visit (INDEPENDENT_AMBULATORY_CARE_PROVIDER_SITE_OTHER): Payer: BC Managed Care – PPO | Admitting: Family Medicine

## 2022-05-18 ENCOUNTER — Other Ambulatory Visit: Payer: Self-pay

## 2022-05-18 VITALS — BP 118/60 | HR 76 | Resp 16 | Ht 74.0 in | Wt 237.0 lb

## 2022-05-18 DIAGNOSIS — N521 Erectile dysfunction due to diseases classified elsewhere: Secondary | ICD-10-CM

## 2022-05-18 DIAGNOSIS — N401 Enlarged prostate with lower urinary tract symptoms: Secondary | ICD-10-CM

## 2022-05-18 DIAGNOSIS — E139 Other specified diabetes mellitus without complications: Secondary | ICD-10-CM

## 2022-05-18 DIAGNOSIS — E559 Vitamin D deficiency, unspecified: Secondary | ICD-10-CM | POA: Diagnosis not present

## 2022-05-18 DIAGNOSIS — M7702 Medial epicondylitis, left elbow: Secondary | ICD-10-CM

## 2022-05-18 DIAGNOSIS — E1049 Type 1 diabetes mellitus with other diabetic neurological complication: Secondary | ICD-10-CM

## 2022-05-18 DIAGNOSIS — E785 Hyperlipidemia, unspecified: Secondary | ICD-10-CM

## 2022-05-18 DIAGNOSIS — Z23 Encounter for immunization: Secondary | ICD-10-CM

## 2022-05-18 DIAGNOSIS — R350 Frequency of micturition: Secondary | ICD-10-CM

## 2022-05-18 MED ORDER — ATORVASTATIN CALCIUM 40 MG PO TABS
40.0000 mg | ORAL_TABLET | Freq: Every day | ORAL | 3 refills | Status: DC
  Filled 2022-05-18: qty 90, 90d supply, fill #0
  Filled 2022-09-22: qty 90, 90d supply, fill #1
  Filled 2022-12-02: qty 90, 90d supply, fill #2
  Filled 2023-03-20: qty 90, 90d supply, fill #3

## 2022-05-18 MED ORDER — DICLOFENAC SODIUM 1 % EX GEL
4.0000 g | Freq: Four times a day (QID) | CUTANEOUS | 1 refills | Status: DC
  Filled 2022-05-18: qty 100, 6d supply, fill #0

## 2022-05-18 MED ORDER — DEXCOM G7 SENSOR MISC
1.0000 | 1 refills | Status: DC
  Filled 2022-05-18 (×2): qty 9, 90d supply, fill #0

## 2022-05-24 ENCOUNTER — Other Ambulatory Visit: Payer: Self-pay

## 2022-06-09 ENCOUNTER — Other Ambulatory Visit: Payer: Self-pay | Admitting: Family Medicine

## 2022-06-13 ENCOUNTER — Other Ambulatory Visit: Payer: Self-pay | Admitting: Internal Medicine

## 2022-06-13 ENCOUNTER — Other Ambulatory Visit: Payer: Self-pay

## 2022-06-13 DIAGNOSIS — E1049 Type 1 diabetes mellitus with other diabetic neurological complication: Secondary | ICD-10-CM

## 2022-06-14 ENCOUNTER — Other Ambulatory Visit: Payer: Self-pay

## 2022-06-14 MED ORDER — INSULIN LISPRO (1 UNIT DIAL) 100 UNIT/ML (KWIKPEN)
PEN_INJECTOR | SUBCUTANEOUS | 0 refills | Status: DC
  Filled 2022-06-14: qty 45, 90d supply, fill #0

## 2022-06-14 MED FILL — Insulin Lispro Soln Pen-injector 100 Unit/ML (1 Unit Dial): SUBCUTANEOUS | Qty: 45 | Fill #0 | Status: CN

## 2022-06-14 NOTE — Telephone Encounter (Signed)
Pharmacy staff states they did not receive this rx. There is no receipt received. Will re-send.

## 2022-06-14 NOTE — Telephone Encounter (Signed)
Message left with pharm, (closed) that rx for Claiborne Rigg has been sent twice this afternoon but still no receipt showing, asking them to assess and I will check back tomorrow.

## 2022-06-14 NOTE — Addendum Note (Signed)
Addended by: Matilde Sprang on: 06/14/2022 05:17 PM   Modules accepted: Orders

## 2022-06-14 NOTE — Telephone Encounter (Signed)
Requested Prescriptions  Pending Prescriptions Disp Refills  . HUMALOG KWIKPEN 100 UNIT/ML KwikPen [Pharmacy Med Name: insulin lispro (HUMALOG KWIKPEN) 100 UNIT/ML KwikPen] 45 mL 0    Sig: INJECT 5 TO 16 UNITS INTO THE SKIN WITH MEALS     Endocrinology:  Diabetes - Insulins Passed - 06/13/2022  8:57 PM      Passed - HBA1C is between 0 and 7.9 and within 180 days    Hemoglobin A1C  Date Value Ref Range Status  04/10/2012 6.2 4.2 - 6.3 % Final    Comment:    The American Diabetes Association recommends that a primary goal of therapy should be <7% and that physicians should reevaluate the treatment regimen in patients with HbA1c values consistently >8%.    HbA1c, POC (controlled diabetic range)  Date Value Ref Range Status  11/21/2019 7.9 (A) 0.0 - 7.0 % Final   Hgb A1c MFr Bld  Date Value Ref Range Status  03/08/2022 6.8 (H) <5.7 % of total Hgb Final    Comment:    For someone without known diabetes, a hemoglobin A1c value of 6.5% or greater indicates that they may have  diabetes and this should be confirmed with a follow-up  test. . For someone with known diabetes, a value <7% indicates  that their diabetes is well controlled and a value  greater than or equal to 7% indicates suboptimal  control. A1c targets should be individualized based on  duration of diabetes, age, comorbid conditions, and  other considerations. . Currently, no consensus exists regarding use of hemoglobin A1c for diagnosis of diabetes for children. Renella Cunas - Valid encounter within last 6 months    Recent Outpatient Visits          3 weeks ago Type 1 diabetes mellitus with neuropathy causing erectile dysfunction [facility])   [facility] Steele Sizer, MD   3 months ago Type 1 diabetes mellitus with neuropathy causing erectile dysfunction [facility])   [facility] Sizer, MD   6 months ago Type 1 diabetes mellitus with neuropathy causing erectile  dysfunction [facility])   [facility] Steele Sizer, MD   9 months ago Type 1 diabetes mellitus with neuropathy causing erectile dysfunction [facility])   [facility] Steele Sizer, MD   1 year ago Type 1 diabetes mellitus with neuropathy causing erectile dysfunction [facility])   [facility] Steele Sizer, MD      Future Appointments            In 2 months Ancil Boozer, Drue Stager, MD [place]

## 2022-06-14 NOTE — Telephone Encounter (Signed)
Requested Prescriptions  Pending Prescriptions Disp Refills  . insulin lispro (HUMALOG KWIKPEN) 100 UNIT/ML KwikPen 45 mL 0    Sig: INJECT 5 TO 16 UNITS INTO THE SKIN WITH MEALS     Endocrinology:  Diabetes - Insulins Passed - 06/14/2022  5:17 PM      Passed - HBA1C is between 0 and 7.9 and within 180 days    Hemoglobin A1C  Date Value Ref Range Status  04/10/2012 6.2 4.2 - 6.3 % Final    Comment:    The American Diabetes Association recommends that a primary goal of therapy should be <7% and that physicians should reevaluate the treatment regimen in patients with HbA1c values consistently >8%.    HbA1c, POC (controlled diabetic range)  Date Value Ref Range Status  11/21/2019 7.9 (A) 0.0 - 7.0 % Final   Hgb A1c MFr Bld  Date Value Ref Range Status  03/08/2022 6.8 (H) <5.7 % of total Hgb Final    Comment:    For someone without known diabetes, a hemoglobin A1c value of 6.5% or greater indicates that they may have  diabetes and this should be confirmed with a follow-up  test. . For someone with known diabetes, a value <7% indicates  that their diabetes is well controlled and a value  greater than or equal to 7% indicates suboptimal  control. A1c targets should be individualized based on  duration of diabetes, age, comorbid conditions, and  other considerations. . Currently, no consensus exists regarding use of hemoglobin A1c for diagnosis of diabetes for children. Verna Czech - Valid encounter within last 6 months    Recent Outpatient Visits          3 weeks ago Type 1 diabetes mellitus with neuropathy causing erectile dysfunction [facility])   [facility] [name], MD   3 months ago Type 1 diabetes mellitus with neuropathy causing erectile dysfunction [facility])   [facility] [name], MD   6 months ago Type 1 diabetes mellitus with neuropathy causing erectile dysfunction [facility])   [facility] [name], MD   9 months ago Type 1 diabetes mellitus with neuropathy causing erectile dysfunction [facility])   [facility] [place], [name], MD   1 year ago Type 1 diabetes mellitus with neuropathy causing erectile dysfunction [facility])   [facility] [name], MD      Future Appointments            In 2 months [name], MD [facility]           Signed Prescriptions Disp Refills   HUMALOG KWIKPEN 100 UNIT/ML KwikPen 45 mL 0    Sig: INJECT 5 TO 16 UNITS INTO THE SKIN WITH MEALS     Endocrinology:  Diabetes - Insulins Passed - 06/13/2022  8:57 PM      Passed - HBA1C is between 0 and 7.9 and within 180 days    Hemoglobin A1C  Date Value Ref Range Status  04/10/2012 6.2 4.2 - 6.3 % Final    Comment:    The American Diabetes Association recommends that a primary goal of therapy should be <7% and that physicians should reevaluate the treatment regimen in patients with HbA1c values consistently >8%.    HbA1c, POC (controlled diabetic range)  Date Value Ref Range Status  11/21/2019 7.9 (A) 0.0 - 7.0 % Final   Hgb A1c  MFr Bld  Date Value Ref Range Status  03/08/2022 6.8 (H) <5.7 % of total Hgb Final    Comment:    For someone without known diabetes, a hemoglobin A1c value of 6.5% or greater indicates that they may have  diabetes and this should be confirmed with a follow-up  test. . For someone with known diabetes, a value <7% indicates  that their diabetes is well controlled and a value  greater than or equal to 7% indicates suboptimal  control. A1c targets should be individualized based on  duration of diabetes, age, comorbid conditions, and  other considerations. . Currently, no consensus exists regarding use of hemoglobin A1c for diagnosis of diabetes for children. Renella Cunas - Valid encounter within last 6 months    Recent Outpatient Visits          3 weeks ago Type 1 diabetes mellitus  with neuropathy causing erectile dysfunction [facility])   [facility] Steele Sizer, MD   3 months ago Type 1 diabetes mellitus with neuropathy causing erectile dysfunction [facility])   [facility] Steele Sizer, MD   6 months ago Type 1 diabetes mellitus with neuropathy causing erectile dysfunction [facility])   [facility] Steele Sizer, MD   9 months ago Type 1 diabetes mellitus with neuropathy causing erectile dysfunction [facility])   [facility] Steele Sizer, MD   1 year ago Type 1 diabetes mellitus with neuropathy causing erectile dysfunction [facility])   [facility] Steele Sizer, MD      Future Appointments            In 2 months Ancil Boozer, Drue Stager, MD [place]

## 2022-06-15 ENCOUNTER — Other Ambulatory Visit: Payer: Self-pay

## 2022-06-15 NOTE — Telephone Encounter (Signed)
Spoke with pharmacy staff, Caryl Pina. She states she does not know why the rx is not showing a receipt that it was received but it was and it is filled and ready for the pt.

## 2022-06-18 ENCOUNTER — Other Ambulatory Visit: Payer: Self-pay

## 2022-06-22 ENCOUNTER — Other Ambulatory Visit: Payer: Self-pay

## 2022-06-23 ENCOUNTER — Other Ambulatory Visit: Payer: Self-pay

## 2022-07-04 ENCOUNTER — Other Ambulatory Visit: Payer: Self-pay | Admitting: Family Medicine

## 2022-07-04 DIAGNOSIS — E1049 Type 1 diabetes mellitus with other diabetic neurological complication: Secondary | ICD-10-CM

## 2022-07-04 DIAGNOSIS — E1069 Type 1 diabetes mellitus with other specified complication: Secondary | ICD-10-CM

## 2022-07-05 ENCOUNTER — Other Ambulatory Visit: Payer: Self-pay

## 2022-07-06 ENCOUNTER — Other Ambulatory Visit: Payer: Self-pay

## 2022-07-06 ENCOUNTER — Other Ambulatory Visit: Payer: Self-pay | Admitting: Family Medicine

## 2022-07-06 DIAGNOSIS — E1069 Type 1 diabetes mellitus with other specified complication: Secondary | ICD-10-CM

## 2022-07-06 DIAGNOSIS — E1049 Type 1 diabetes mellitus with other diabetic neurological complication: Secondary | ICD-10-CM

## 2022-07-06 MED FILL — Glucose Blood Test Strip: 60 days supply | Qty: 300 | Fill #0 | Status: AC

## 2022-07-13 ENCOUNTER — Other Ambulatory Visit: Payer: Self-pay | Admitting: Family Medicine

## 2022-07-14 ENCOUNTER — Other Ambulatory Visit: Payer: Self-pay

## 2022-07-14 NOTE — Telephone Encounter (Signed)
Requested Prescriptions  Pending Prescriptions Disp Refills  . sildenafil (REVATIO) 20 MG tablet [Pharmacy Med Name: SILDENAFIL 20 MG TABLET] 60 tablet 0    Sig: TAKE 1 TO 2 TABLETS BY MOUTH DAILY AS NEEDED     Urology: Erectile Dysfunction Agents Passed - 07/13/2022  7:37 PM      Passed - AST in normal range and within 360 days    AST  Date Value Ref Range Status  03/08/2022 16 10 - 35 U/L Final         Passed - ALT in normal range and within 360 days    ALT  Date Value Ref Range Status  03/08/2022 16 9 - 46 U/L Final         Passed - Last BP in normal range    BP Readings from Last 1 Encounters:  05/18/22 118/60         Passed - Valid encounter within last 12 months    Recent Outpatient Visits          1 month ago Type 1 diabetes mellitus with neuropathy causing erectile dysfunction [facility])   [facility] Steele Sizer, MD   4 months ago Type 1 diabetes mellitus with neuropathy causing erectile dysfunction [facility])   [facility] [place], Drue Stager, MD   7 months ago Type 1 diabetes mellitus with neuropathy causing erectile dysfunction [facility])   [facility] [place], Drue Stager, MD   10 months ago Type 1 diabetes mellitus with neuropathy causing erectile dysfunction [facility])   [facility] [place], Drue Stager, MD   1 year ago Type 1 diabetes mellitus with neuropathy causing erectile dysfunction [facility])   [facility] Steele Sizer, MD      Future Appointments            In 1 month Ancil Boozer, Drue Stager, MD [facility]

## 2022-07-23 ENCOUNTER — Other Ambulatory Visit: Payer: Self-pay

## 2022-07-26 ENCOUNTER — Other Ambulatory Visit: Payer: Self-pay

## 2022-07-26 MED ORDER — FLUTICASONE PROPIONATE 50 MCG/ACT NA SUSP
2.0000 | Freq: Every day | NASAL | 0 refills | Status: DC
  Filled 2022-07-26: qty 16, 30d supply, fill #0

## 2022-07-27 ENCOUNTER — Other Ambulatory Visit: Payer: Self-pay

## 2022-08-16 NOTE — Progress Notes (Unsigned)
Name: Ivan Booker   MRN: [id number]    DOB: [birth year]   Date:08/17/2022       Progress Note  Subjective  Chief Complaint  Follow Up  HPI  DMI with ED: he is doing well , A1C is down from 7.1 % to 6.8 % and today is 6.9 %  He is using Dexcom 6  and it has helped because he sees the trend of glucose going up or down, he states had one hypoglycemic episode after he played golf . No polyphagia, polyuria or polydipsia. ED he is back Revatio. He was diagnosed with DM as an young adult ( mid-20's ) and has good knowledge of his condition.  Blood sugar average on his fsbs was 140's average last 2 weeks    BPH: used to see Urologist - Dr. Lonna Cobb - years ago, had normal biopsies in the past. He was on Cialis but stopped working, taking sildenafil and is doing well now, discussed goodrx to decrease the cost  Hypogonadism: He is not on testosterone supplementation anymore because insurance denied coverage.  Energy level continues to be good, libido also good . He takes viagra prn   Hyperlipidemia: at goal, taking Atorvastatin every day, No complaints of cramping or chest pain. He has been taking 40 mg of atorvastatin, unable to tolerate higher dose and last LDL was at goal - below 70. Continue medication   Vitamin D deficiency: He is taking his vitamin D supplement daily. Unchanged   Golfer;s elbow: he has some pain last year, but now it is on the lateral elbow, worse when gripping his hand and also playing golf, sometimes has tingling on index finger that seems unrelated. 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) 03/07/2015   Dyslipidemia 03/07/2015   Hypogonadism male 03/07/2015   Obesity (BMI 30.0-34.9) 03/07/2015   Allergic rhinitis, seasonal  03/07/2015   Vitamin D deficiency 03/07/2015    Past Surgical History:  Procedure Laterality Date   VASECTOMY  1993    Family History  Problem Relation Age of Onset   COPD Father        Smoker    Bladder Cancer Neg Hx    Kidney cancer Neg Hx    Prostate cancer Neg Hx     Social History   Tobacco Use   Smoking status: Never   Smokeless tobacco: Never  Substance Use Topics   Alcohol use: Yes    Comment: 1-2 beers on weekends     Current Outpatient Medications:    aspirin 81 MG chewable tablet, Chew 1 tablet by mouth daily., Disp: , Rfl:    atorvastatin (LIPITOR) 40 MG tablet, Take 1 tablet (40 mg total) by mouth daily., Disp: 90 tablet, Rfl: 3   azelastine (ASTELIN) 0.1 % nasal spray, Instill 2 sprays in each nostril twice daily, Disp: 30 mL, Rfl: 12   Cholecalciferol (VITAMIN D) 2000 UNITS CAPS, Take 1 capsule by mouth daily., Disp: , Rfl:    Continuous Blood Gluc Sensor (DEXCOM G7 SENSOR) MISC, 1 each by Does not apply route as directed. Every 10 days, Disp: 9 each, Rfl: 1   diclofenac Sodium (VOLTAREN) 1 % GEL, Apply 4 g topically 4 (four) times daily., Disp: 100 g, Rfl: 1  fluticasone (FLONASE) 50 MCG/ACT nasal spray, Instill 2 sprays in each nostril once daily, Disp: 16 g, Rfl: 0   Glucagon (GVOKE HYPOPEN 1-PACK) 1 MG/0.2ML SOAJ, Inject 1 mg into the skin daily as needed. May repeat in 15 minutes, Disp: 0.4 mL, Rfl: 1   glucose blood (FREESTYLE LITE) test strip, CHECK FASTING BLOOD SUGARS 5 TIMES DAILY, Disp: 300 strip, Rfl: 2   insulin degludec (TRESIBA FLEXTOUCH) 200 UNIT/ML FlexTouch Pen, INJECT 36 UNITS INTO THE SKIN DAILY., Disp: 9 mL, Rfl: 2   insulin lispro (HUMALOG KWIKPEN) 100 UNIT/ML KwikPen, INJECT 5 TO 16 UNITS INTO THE SKIN WITH MEALS, Disp: 45 mL, Rfl: 0   Insulin Pen Needle (UNIFINE PENTIPS) 31G X 8 MM MISC, USE AS DIRECTED, Disp: 100 each, Rfl: 3   Lancets (FREESTYLE) lancets, Use as instructed, Disp: 100 each, Rfl: 12   sildenafil (REVATIO) 20 MG  tablet, TAKE 1 TO 2 TABLETS BY MOUTH DAILY AS NEEDED, Disp: 60 tablet, Rfl: 0  No Known Allergies  I personally reviewed active problem list, medication list, allergies, family history, social history, health maintenance with the patient/caregiver today.   ROS  Ten systems reviewed and is negative except as mentioned in HPI   Objective  Vitals:   08/17/22 1436  BP: 116/70  Pulse: 60  Resp: 16  SpO2: 95%  Weight: 243 lb (110.2 kg)  Height: 6\' 2"  (1.88 m)    Body mass index is 31.2 kg/m.  Physical Exam  Constitutional: Patient appears well-developed and well-nourished. Obese  No distress.  HEENT: head atraumatic, normocephalic, pupils equal and reactive to light, neck supple, throat within normal limits Cardiovascular: Normal rate, regular rhythm and normal heart sounds.  No murmur heard. No BLE edema. Pulmonary/Chest: Effort normal and breath sounds normal. No respiratory distress. Abdominal: Soft.  There is no tenderness. Psychiatric: Patient has a normal mood and affect. behavior is normal. Judgment and thought content normal.   Recent Results (from the past 2160 hour(s))  POCT HgB A1C     Status: Abnormal   Collection Time: 08/17/22  2:38 PM  Result Value Ref Range   Hemoglobin A1C 6.9 (A) 4.0 - 5.6 %   HbA1c POC (<> result, manual entry)     HbA1c, POC (prediabetic range)     HbA1c, POC (controlled diabetic range)       PHQ2/9:    08/17/2022    2:37 PM 05/18/2022    2:49 PM 02/15/2022    2:52 PM 12/04/2021    1:08 PM 08/26/2021    9:47 AM  Depression screen PHQ 2/9  Decreased Interest 0 0 0 0 0  Down, Depressed, Hopeless 0 0 0 0 0  PHQ - 2 Score 0 0 0 0 0  Altered sleeping 0 0 0 0 0  Tired, decreased energy 0 0 0 0 0  Change in appetite 0 0 0 0 0  Feeling bad or failure about yourself  0 0 0 0 0  Trouble concentrating 0 0 0 0 0  Moving slowly or fidgety/restless 0 0 0 0 0  Suicidal thoughts 0 0 0 0 0  PHQ-9 Score 0 0 0 0 0  Difficult doing work/chores     Not difficult at all     phq 9 is negative   Fall Risk:    08/17/2022    2:37 PM 05/18/2022    2:49 PM 02/15/2022    2:51 PM 12/04/2021    1:08 PM 08/26/2021    9:47 AM  Fall Risk   Falls in the past year? 0 0 0 0 0  Number falls in past yr: 0 0 0 0 0  Injury with Fall? 0 0 0 0 0  Risk for fall due to : No Fall Risks No Fall Risks No Fall Risks No Fall Risks No Fall Risks  Follow up Falls prevention discussed Falls prevention discussed Falls prevention discussed Falls prevention discussed Falls prevention discussed      Functional Status Survey: Is the patient deaf or have difficulty hearing?: No Does the patient have difficulty seeing, even when wearing glasses/contacts?: No Does the patient have difficulty concentrating, remembering, or making decisions?: No Does the patient have difficulty walking or climbing stairs?: No Does the patient have difficulty dressing or bathing?: No Does the patient have difficulty doing errands alone such as visiting a doctor's office or shopping?: No    Assessment & Plan  1. 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 Diabetic erectile dysfunction associated with type 1 diabetes mellitus (HCC)  - sildenafil (VIAGRA) 100 MG tablet; Take 0.5-1 tablets (50-100 mg total) by mouth daily as needed for erectile dysfunction.  Dispense: 90 tablet; Refill: 0   3. Golfer's elbow, left  Doing better   4. Benign prostatic hyperplasia with urinary frequency   5. Vitamin D deficiency  Continue supplementation  6. Dyslipidemia

## 2022-08-17 ENCOUNTER — Encounter: Payer: Self-pay | Admitting: Family Medicine

## 2022-08-17 ENCOUNTER — Ambulatory Visit (INDEPENDENT_AMBULATORY_CARE_PROVIDER_SITE_OTHER): Payer: BC Managed Care – PPO | Admitting: Family Medicine

## 2022-08-17 VITALS — BP 116/70 | HR 60 | Resp 16 | Ht 74.0 in | Wt 243.0 lb

## 2022-08-17 DIAGNOSIS — E1049 Type 1 diabetes mellitus with other diabetic neurological complication: Secondary | ICD-10-CM

## 2022-08-17 DIAGNOSIS — N401 Enlarged prostate with lower urinary tract symptoms: Secondary | ICD-10-CM

## 2022-08-17 DIAGNOSIS — R350 Frequency of micturition: Secondary | ICD-10-CM

## 2022-08-17 DIAGNOSIS — E1069 Type 1 diabetes mellitus with other specified complication: Secondary | ICD-10-CM | POA: Diagnosis not present

## 2022-08-17 DIAGNOSIS — N521 Erectile dysfunction due to diseases classified elsewhere: Secondary | ICD-10-CM

## 2022-08-17 DIAGNOSIS — M7702 Medial epicondylitis, left elbow: Secondary | ICD-10-CM | POA: Diagnosis not present

## 2022-08-17 DIAGNOSIS — E559 Vitamin D deficiency, unspecified: Secondary | ICD-10-CM

## 2022-08-17 DIAGNOSIS — E785 Hyperlipidemia, unspecified: Secondary | ICD-10-CM

## 2022-08-17 LAB — POCT GLYCOSYLATED HEMOGLOBIN (HGB A1C): Hemoglobin A1C: 6.9 % — AB (ref 4.0–5.6)

## 2022-08-17 MED ORDER — SILDENAFIL CITRATE 100 MG PO TABS: 50.0000 mg | ORAL_TABLET | Freq: Every day | ORAL | 11 refills | Status: DC | PRN

## 2022-08-17 MED ORDER — SILDENAFIL CITRATE 100 MG PO TABS: 50.0000 mg | ORAL_TABLET | Freq: Every day | ORAL | 0 refills | Status: DC | PRN

## 2022-08-22 ENCOUNTER — Other Ambulatory Visit: Payer: Self-pay | Admitting: Family Medicine

## 2022-08-22 ENCOUNTER — Other Ambulatory Visit: Payer: Self-pay

## 2022-08-22 DIAGNOSIS — E1049 Type 1 diabetes mellitus with other diabetic neurological complication: Secondary | ICD-10-CM

## 2022-08-22 MED ORDER — TRESIBA FLEXTOUCH 200 UNIT/ML ~~LOC~~ SOPN
PEN_INJECTOR | SUBCUTANEOUS | 2 refills | Status: DC
  Filled 2022-08-22: qty 9, 50d supply, fill #0
  Filled 2022-10-12: qty 9, 50d supply, fill #1
  Filled 2022-12-02: qty 9, 50d supply, fill #2

## 2022-08-22 MED FILL — Glucose Blood Test Strip: 60 days supply | Qty: 300 | Fill #1 | Status: AC

## 2022-08-23 ENCOUNTER — Other Ambulatory Visit: Payer: Self-pay

## 2022-09-22 ENCOUNTER — Other Ambulatory Visit: Payer: Self-pay | Admitting: Family Medicine

## 2022-09-22 DIAGNOSIS — E1049 Type 1 diabetes mellitus with other diabetic neurological complication: Secondary | ICD-10-CM

## 2022-09-23 ENCOUNTER — Other Ambulatory Visit: Payer: Self-pay | Admitting: Family Medicine

## 2022-09-23 ENCOUNTER — Other Ambulatory Visit: Payer: Self-pay

## 2022-09-23 DIAGNOSIS — E1049 Type 1 diabetes mellitus with other diabetic neurological complication: Secondary | ICD-10-CM

## 2022-09-23 MED FILL — Insulin Lispro Soln Pen-injector 100 Unit/ML (1 Unit Dial): SUBCUTANEOUS | 90 days supply | Qty: 45 | Fill #0 | Status: AC

## 2022-09-24 ENCOUNTER — Other Ambulatory Visit: Payer: Self-pay

## 2022-09-28 ENCOUNTER — Other Ambulatory Visit: Payer: Self-pay

## 2022-09-28 ENCOUNTER — Ambulatory Visit (INDEPENDENT_AMBULATORY_CARE_PROVIDER_SITE_OTHER): Payer: BC Managed Care – PPO | Admitting: Family Medicine

## 2022-09-28 ENCOUNTER — Encounter: Payer: Self-pay | Admitting: Family Medicine

## 2022-09-28 ENCOUNTER — Ambulatory Visit
Admission: RE | Admit: 2022-09-28 | Discharge: 2022-09-28 | Disposition: A | Discharge status: Home / Self Care | Payer: BC Managed Care – PPO | Attending: Family Medicine | Admitting: Family Medicine

## 2022-09-28 ENCOUNTER — Ambulatory Visit
Admission: RE | Admit: 2022-09-28 | Discharge: 2022-09-28 | Disposition: A | Discharge status: Home / Self Care | Payer: BC Managed Care – PPO | Source: Ambulatory Visit | Attending: Family Medicine | Admitting: Family Medicine

## 2022-09-28 VITALS — BP 134/72 | HR 93 | Temp 98.4°F | Resp 16 | Ht 74.0 in | Wt 235.0 lb

## 2022-09-28 DIAGNOSIS — R058 Other specified cough: Secondary | ICD-10-CM | POA: Insufficient documentation

## 2022-09-28 DIAGNOSIS — R509 Fever, unspecified: Secondary | ICD-10-CM | POA: Diagnosis not present

## 2022-09-28 DIAGNOSIS — N521 Erectile dysfunction due to diseases classified elsewhere: Secondary | ICD-10-CM | POA: Diagnosis not present

## 2022-09-28 DIAGNOSIS — E1049 Type 1 diabetes mellitus with other diabetic neurological complication: Secondary | ICD-10-CM | POA: Diagnosis not present

## 2022-09-28 DIAGNOSIS — R059 Cough, unspecified: Secondary | ICD-10-CM | POA: Diagnosis not present

## 2022-09-28 LAB — CBC WITH DIFFERENTIAL/PLATELET
Absolute Monocytes: 766 cells/uL (ref 200–950)
Basophils Absolute: 40 cells/uL (ref 0–200)
Basophils Relative: 0.6 %
Eosinophils Absolute: 53 cells/uL (ref 15–500)
Eosinophils Relative: 0.8 %
HCT: 46.8 % (ref 38.5–50.0)
Hemoglobin: 16.5 g/dL (ref 13.2–17.1)
Lymphs Abs: 1366 cells/uL (ref 850–3900)
MCH: 30.4 pg (ref 27.0–33.0)
MCHC: 35.3 g/dL (ref 32.0–36.0)
MCV: 86.3 fL (ref 80.0–100.0)
MPV: 10.1 fL (ref 7.5–12.5)
Monocytes Relative: 11.6 %
Neutro Abs: 4376 cells/uL (ref 1500–7800)
Neutrophils Relative %: 66.3 %
Platelets: 195 10*3/uL (ref 140–400)
RBC: 5.42 10*6/uL (ref 4.20–5.80)
RDW: 11.6 % (ref 11.0–15.0)
Total Lymphocyte: 20.7 %
WBC: 6.6 10*3/uL (ref 3.8–10.8)

## 2022-09-28 LAB — BASIC METABOLIC PANEL WITH GFR
BUN: 14 mg/dL (ref 7–25)
CO2: 28 mmol/L (ref 20–32)
Calcium: 8.9 mg/dL (ref 8.6–10.3)
Chloride: 97 mmol/L — ABNORMAL LOW (ref 98–110)
Creat: 0.99 mg/dL (ref 0.70–1.30)
Glucose, Bld: 300 mg/dL — ABNORMAL HIGH (ref 65–99)
Potassium: 4.2 mmol/L (ref 3.5–5.3)
Sodium: 134 mmol/L — ABNORMAL LOW (ref 135–146)
eGFR: 88 mL/min/{1.73_m2} (ref >=60)

## 2022-09-28 MED ORDER — BENZONATATE 100 MG PO CAPS
100.0000 mg | ORAL_CAPSULE | Freq: Two times a day (BID) | ORAL | 0 refills | Status: DC | PRN
  Filled 2022-09-28: qty 40, 10d supply, fill #0

## 2022-09-28 MED ORDER — LIDOCAINE HCL (PF) 1 % IJ SOLN
2.0000 mL | Freq: Once | INTRAMUSCULAR | Status: AC
  Administered 2022-09-28: 2 mL via INTRADERMAL

## 2022-09-28 MED ORDER — AZITHROMYCIN 250 MG PO TABS
ORAL_TABLET | ORAL | 0 refills | Status: AC
  Filled 2022-09-28: qty 6, 5d supply, fill #0

## 2022-09-28 MED ORDER — ALBUTEROL SULFATE HFA 108 (90 BASE) MCG/ACT IN AERS
2.0000 | INHALATION_SPRAY | Freq: Four times a day (QID) | RESPIRATORY_TRACT | 0 refills | Status: DC | PRN
  Filled 2022-09-28: qty 8.5, 25d supply, fill #0

## 2022-09-28 MED ORDER — CEFTRIAXONE SODIUM 500 MG IJ SOLR
1000.0000 mg | Freq: Once | INTRAMUSCULAR | Status: AC
  Administered 2022-09-28: 1000 mg via INTRAMUSCULAR

## 2022-09-28 NOTE — Progress Notes (Signed)
Name: Ivan Booker   MRN: [id number]    DOB: [birth year]   Date:09/28/2022       Progress Note  Subjective  Chief Complaint  Cough/ Congestion  HPI  Cough: he states symptoms started one week ago, initially with fatigue, and temperature of 100.7  Denies rhinorrhea, sore throat, nasal congestion. He states only symptoms is a cough that is now productive, he still has low grade fever at 99.1 . He had one episode of SOB last week/felt like could not clear his lungs but that has resolved. He has also noticed glucose has been higher than usual.    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) 03/07/2015   Dyslipidemia 03/07/2015   Hypogonadism male 03/07/2015   Obesity (BMI 30.0-34.9) 03/07/2015   Allergic rhinitis, seasonal 03/07/2015   Vitamin D deficiency 03/07/2015    Past Surgical History:  Procedure Laterality Date   VASECTOMY  1993    Family History  Problem Relation Age of Onset   COPD Father        Smoker    Bladder Cancer Neg Hx    Kidney cancer Neg Hx    Prostate cancer Neg Hx     Social History   Tobacco Use   Smoking status: Never   Smokeless tobacco: Never  Substance Use Topics   Alcohol use: Yes    Comment: 1-2 beers on weekends     Current Outpatient Medications:    aspirin 81 MG chewable tablet, Chew 1 tablet by mouth daily., Disp: , Rfl:    atorvastatin (LIPITOR) 40 MG tablet, Take 1 tablet (40 mg total) by mouth daily., Disp: 90 tablet, Rfl: 3   azelastine (ASTELIN) 0.1 % nasal spray, Instill 2 sprays in each nostril twice daily, Disp: 30 mL, Rfl: 12   Cholecalciferol (VITAMIN D) 2000 UNITS CAPS, Take 1 capsule by mouth daily., Disp: , Rfl:    Continuous Blood Gluc Sensor (DEXCOM G7 SENSOR) MISC, 1 each by Does not apply route as directed. Every 10 days, Disp: 9 each, Rfl: 1    diclofenac Sodium (VOLTAREN) 1 % GEL, Apply 4 g topically 4 (four) times daily., Disp: 100 g, Rfl: 1   fluticasone (FLONASE) 50 MCG/ACT nasal spray, Instill 2 sprays in each nostril once daily, Disp: 16 g, Rfl: 0   Glucagon (GVOKE HYPOPEN 1-PACK) 1 MG/0.2ML SOAJ, Inject 1 mg into the skin daily as needed. May repeat in 15 minutes, Disp: 0.4 mL, Rfl: 1   glucose blood (FREESTYLE LITE) test strip, CHECK FASTING BLOOD SUGARS 5 TIMES DAILY, Disp: 300 strip, Rfl: 2   insulin degludec (TRESIBA FLEXTOUCH) 200 UNIT/ML FlexTouch Pen, INJECT 36 UNITS INTO THE SKIN DAILY., Disp: 9 mL, Rfl: 2   insulin lispro (HUMALOG KWIKPEN) 100 UNIT/ML KwikPen, INJECT 5 TO 16 UNITS INTO THE SKIN WITH MEALS, Disp: 45 mL, Rfl: 0   Insulin Pen Needle (UNIFINE PENTIPS) 31G X 8 MM MISC, USE AS DIRECTED, Disp: 100 each, Rfl: 3   Lancets (FREESTYLE) lancets, Use as instructed, Disp: 100 each, Rfl: 12   sildenafil (VIAGRA) 100 MG tablet, Take 0.5-1 tablets (50-100 mg total) by mouth daily as needed for erectile dysfunction., Disp: 90 tablet, Rfl: 0  No Known Allergies  I personally reviewed active problem list, medication list, allergies, family history, social history, health maintenance with the patient/caregiver today.   ROS  Ten systems reviewed and is negative except as mentioned in HPI   Objective  Vitals:   09/28/22 0906  BP: 134/72  Pulse: 93  Resp: 16  Temp: 98.4 F (36.9 C)  TempSrc: Oral  SpO2: 98%  Weight: 235 lb (106.6 kg)  Height: 6\' 2"  (1.88 m)    Body mass index is 30.17 kg/m.  Physical Exam  Constitutional: Patient appears well-developed and well-nourished. Obese  No distress.  HEENT: head atraumatic, normocephalic, pupils equal and reactive to light, ears normal TM, neck supple, throat within normal limits Cardiovascular: Normal rate, regular rhythm and normal heart sounds.  No murmur heard. No BLE edema. Pulmonary/Chest: Effort normal  , end inspiratory wheezing on both lung fields but  worse on right side . No respiratory distress. Abdominal: Soft.  There is no tenderness. Psychiatric: Patient has a normal mood and affect. behavior is normal. Judgment and thought content normal.   Recent Results (from the past 2160 hour(s))  POCT HgB A1C     Status: Abnormal   Collection Time: 08/17/22  2:38 PM  Result Value Ref Range   Hemoglobin A1C 6.9 (A) 4.0 - 5.6 %   HbA1c POC (<> result, manual entry)     HbA1c, POC (prediabetic range)     HbA1c, POC (controlled diabetic range)      PHQ2/9:    09/28/2022    9:06 AM 08/17/2022    2:37 PM 05/18/2022    2:49 PM 02/15/2022    2:52 PM 12/04/2021    1:08 PM  Depression screen PHQ 2/9  Decreased Interest 0 0 0 0 0  Down, Depressed, Hopeless 0 0 0 0 0  PHQ - 2 Score 0 0 0 0 0  Altered sleeping 0 0 0 0 0  Tired, decreased energy 0 0 0 0 0  Change in appetite 0 0 0 0 0  Feeling bad or failure about yourself  0 0 0 0 0  Trouble concentrating 0 0 0 0 0  Moving slowly or fidgety/restless 0 0 0 0 0  Suicidal thoughts 0 0 0 0 0  PHQ-9 Score 0 0 0 0 0  Difficult doing work/chores     Not difficult at all    phq 9 is negative   Fall Risk:    09/28/2022    9:05 AM 08/17/2022    2:37 PM 05/18/2022    2:49 PM 02/15/2022    2:51 PM 12/04/2021    1:08 PM  Fall Risk   Falls in the past year? 0 0 0 0 0  Number falls in past yr: 0 0 0 0 0  Injury with Fall? 0 0 0 0 0  Risk for fall due to : No Fall Risks No Fall Risks No Fall Risks No Fall Risks No Fall Risks  Follow up Falls prevention discussed Falls prevention discussed Falls prevention discussed Falls prevention discussed Falls prevention discussed      Functional Status Survey: Is the patient deaf or have difficulty hearing?: No Does the patient have difficulty seeing, even when wearing glasses/contacts?: No Does the patient have difficulty concentrating, remembering, or making decisions?: No Does the patient have difficulty walking or climbing stairs?: No Does the patient  have difficulty dressing or bathing?: No Does the patient have difficulty doing errands alone such as visiting a doctor's office or shopping?: No    Assessment & Plan  1. Productive cough  Possible CAP, no URI symptoms, we will treat empirically and check CXR and labs  today  - DG Chest 2 View; Future - CBC with Differential/Platelet - BASIC METABOLIC PANEL WITH GFR - azithromycin (ZITHROMAX) 250 MG tablet; Take 2 tablets on day 1, then 1 tablet daily on days 2 through 5  Dispense: 6 tablet; Refill: 0 - cefTRIAXone (ROCEPHIN) injection 1,000 mg - lidocaine (PF) (XYLOCAINE) 1 % injection 2 mL - albuterol (VENTOLIN HFA) 108 (90 Base) MCG/ACT inhaler; Inhale 2 puffs into the lungs every 6 (six) hours as needed for wheezing or shortness of breath.  Dispense: 8 g; Refill: 0 - benzonatate (TESSALON) 100 MG capsule; Take 1-2 capsules (100-200 mg total) by mouth 2 (two) times daily as needed.  Dispense: 40 capsule; Refill: 0  2. 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)  Drink water and keep monitoring glucose

## 2022-09-30 NOTE — Progress Notes (Signed)
Name: Ivan Booker   MRN: [id number]    DOB: December 30, 1965   Date:10/01/2022       Progress Note  Subjective  Chief Complaint  Follow Up  HPI  Cough:Ivan Booker was seen in our office on Monday due to developing  fatigue the prior week and fever of 100.7 and dry cough that turned into a productive cough a couple days prior to his visit. Marland Kitchen He had one episode of SOB last week/felt like could not clear his lungs but that has resolved. He had also noticed glucose has been higher than usual. We checked CXR that was negative but treated him empirically with Rocephin and Zpack. He states he started to fell better within 12 hours of taking antibiotics, cough has resolved, no chest congestion and fever resolved His appetite is normal and glucose started to normalized within 24 hours of taking antibiotics   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 ([place]) 03/07/2015   Dyslipidemia 03/07/2015   Hypogonadism male 03/07/2015   Obesity (BMI 30.0-34.9) 03/07/2015   Allergic rhinitis, seasonal 03/07/2015   Vitamin D deficiency 03/07/2015    Past Surgical History:  Procedure Laterality Date   VASECTOMY  1993    Family History  Problem Relation Age of Onset   COPD Father        Smoker    Bladder Cancer Neg Hx    Booker cancer Neg Hx    Prostate cancer Neg Hx     Social History   Tobacco Use   Smoking status: Never   Smokeless tobacco: Never  Substance Use Topics   Alcohol use: Yes    Comment: 1-2 beers on weekends     Current Outpatient Medications:    albuterol (VENTOLIN HFA) 108 (90 Base) MCG/ACT inhaler, Inhale 2 puffs into the lungs every 6 (six) hours as needed for wheezing or shortness of breath., Disp: 8.5 g, Rfl: 0   aspirin 81 MG chewable tablet, Chew 1 tablet by mouth daily., Disp: , Rfl:    atorvastatin (LIPITOR) 40 MG  tablet, Take 1 tablet (40 mg total) by mouth daily., Disp: 90 tablet, Rfl: 3   azelastine (ASTELIN) 0.1 % nasal spray, Instill 2 sprays in each nostril twice daily, Disp: 30 mL, Rfl: 12   azithromycin (ZITHROMAX) 250 MG tablet, Take 2 tablets by mouth on day 1, then 1 tablet daily on days 2 through 5, Disp: 6 tablet, Rfl: 0   benzonatate (TESSALON) 100 MG capsule, Take 1-2 capsules (100-200 mg total) by mouth 2 (two) times daily as needed., Disp: 40 capsule, Rfl: 0   Cholecalciferol (VITAMIN D) 2000 UNITS CAPS, Take 1 capsule by mouth daily., Disp: , Rfl:    Continuous Blood Gluc Sensor ([place]) MISC, 1 each by Does not apply route as directed. Every 10 days, Disp: 9 each, Rfl: 1   diclofenac Sodium (VOLTAREN) 1 % GEL, Apply 4 g topically 4 (four) times daily., Disp: 100 g, Rfl: 1   fluticasone (FLONASE) 50 MCG/ACT nasal spray, Instill 2 sprays in each nostril once daily, Disp: 16 g, Rfl: 0   Glucagon (GVOKE HYPOPEN 1-PACK) 1 MG/0.2ML SOAJ, Inject 1 mg into the skin daily as needed. May repeat in 15 minutes, Disp: 0.4 mL, Rfl: 1   glucose blood (FREESTYLE LITE) test strip, CHECK FASTING BLOOD SUGARS 5 TIMES DAILY, Disp: 300 strip, Rfl:  2   insulin degludec (TRESIBA FLEXTOUCH) 200 UNIT/ML FlexTouch Pen, INJECT 36 UNITS INTO THE SKIN DAILY., Disp: 9 mL, Rfl: 2   insulin lispro (HUMALOG KWIKPEN) 100 UNIT/ML KwikPen, INJECT 5 TO 16 UNITS INTO THE SKIN WITH MEALS, Disp: 45 mL, Rfl: 0   Insulin Pen Needle (UNIFINE PENTIPS) 31G X 8 MM MISC, USE AS DIRECTED, Disp: 100 each, Rfl: 3   Lancets (FREESTYLE) lancets, Use as instructed, Disp: 100 each, Rfl: 12   sildenafil (VIAGRA) 100 MG tablet, Take 0.5-1 tablets (50-100 mg total) by mouth daily as needed for erectile dysfunction., Disp: 90 tablet, Rfl: 0  No Known Allergies  I personally reviewed active problem list, medication list, allergies, family history, social history, health maintenance with the patient/caregiver today.   ROS  Ten  systems reviewed and is negative except as mentioned in HPI   Objective  Vitals:   10/01/22 1504  BP: 132/70  Pulse: 78  Resp: 16  SpO2: 97%  Weight: 235 lb (106.6 kg)  Height: _0  (1.88 m)    Body mass index is 30.17 kg/m.  Physical Exam  Constitutional: Patient appears well-developed and well-nourished. Obese  No distress.  HEENT: head atraumatic, normocephalic, pupils equal and reactive to light, , neck supple Cardiovascular: Normal rate, regular rhythm and normal heart sounds.  No murmur heard. No BLE edema. Pulmonary/Chest: Effort normal and breath sounds normal. No respiratory distress. Abdominal: Soft.  There is no tenderness. Psychiatric: Patient has a normal mood and affect. behavior is normal. Judgment and thought content normal.   Recent Results (from the past 2160 hour(s))  POCT HgB A1C     Status: Abnormal   Collection Time: 08/17/22  2:38 PM  Result Value Ref Range   Hemoglobin A1C 6.9 (A) 4.0 - 5.6 %   HbA1c POC (<> result, manual entry)     HbA1c, POC (prediabetic range)     HbA1c, POC (controlled diabetic range)    CBC with Differential/Platelet     Status: None   Collection Time: 09/28/22  9:32 AM  Result Value Ref Range   WBC 6.6 3.8 - 10.8 Thousand/uL   RBC 5.42 4.20 - 5.80 Million/uL   Hemoglobin 16.5 13.2 - 17.1 g/dL   HCT 46.8 38.5 - 50.0 %   MCV 86.3 80.0 - 100.0 fL   MCH 30.4 27.0 - 33.0 pg   MCHC 35.3 32.0 - 36.0 g/dL   RDW 11.6 11.0 - 15.0 %   Platelets 195 140 - 400 Thousand/uL   MPV 10.1 7.5 - 12.5 fL   Neutro Abs 4,376 1,500 - 7,800 cells/uL   Lymphs Abs 1,366 850 - 3,900 cells/uL   Absolute Monocytes 766 200 - 950 cells/uL   Eosinophils Absolute 53 15 - 500 cells/uL   Basophils Absolute 40 0 - 200 cells/uL   Neutrophils Relative % 66.3 %   Total Lymphocyte 20.7 %   Monocytes Relative 11.6 %   Eosinophils Relative 0.8 %   Basophils Relative 0.6 %  BASIC METABOLIC PANEL WITH GFR     Status: Abnormal   Collection Time: 09/28/22   9:32 AM  Result Value Ref Range   Glucose, Bld 300 (H) 65 - 99 mg/dL    Comment: .            Fasting reference interval . For someone without known diabetes, a glucose value >125 mg/dL indicates that they may have diabetes and this should be confirmed with a follow-up test. .    BUN 14  7 - 25 mg/dL   Creat 0.99 0.70 - 1.30 mg/dL   eGFR 88 > OR = 60 mL/min/1.60m   BUN/Creatinine Ratio SEE NOTE: 6 - 22 (calc)    Comment:    Not Reported: BUN and Creatinine are within    reference range. .    Sodium 134 (L) 135 - 146 mmol/L   Potassium 4.2 3.5 - 5.3 mmol/L   Chloride 97 (L) 98 - 110 mmol/L   CO2 28 20 - 32 mmol/L   Calcium 8.9 8.6 - 10.3 mg/dL    PHQ2/9:    10/01/2022    3:05 PM 09/28/2022    9:06 AM 08/17/2022    2:37 PM 05/18/2022    2:49 PM 02/15/2022    2:52 PM  Depression screen PHQ 2/9  Decreased Interest 0 0 0 0 0  Down, Depressed, Hopeless 0 0 0 0 0  PHQ - 2 Score 0 0 0 0 0  Altered sleeping 0 0 0 0 0  Tired, decreased energy 0 0 0 0 0  Change in appetite 0 0 0 0 0  Feeling bad or failure about yourself  0 0 0 0 0  Trouble concentrating 0 0 0 0 0  Moving slowly or fidgety/restless 0 0 0 0 0  Suicidal thoughts 0 0 0 0 0  PHQ-9 Score 0 0 0 0 0    phq 9 is negative   Fall Risk:    10/01/2022    3:05 PM 09/28/2022    9:05 AM 08/17/2022    2:37 PM 05/18/2022    2:49 PM 02/15/2022    2:51 PM  Fall Risk   Falls in the past year? 0 0 0 0 0  Number falls in past yr: 0 0 0 0 0  Injury with Fall? 0 0 0 0 0  Risk for fall due to : _0   Follow up _1       Functional Status Survey: Is the patient deaf or have difficulty hearing?: No Does the patient have difficulty seeing, even when wearing glasses/contacts?: No Does the patient have difficulty concentrating,  remembering, or making decisions?: No Does the patient have difficulty walking or climbing stairs?: No Does the patient have difficulty dressing or bathing?: No Does the patient have difficulty doing errands alone such as visiting a doctor's office or shopping?: No    Assessment & Plan  1. Productive cough  He is doing well, possible bacterial bronchitis versus early pneumonia that was treated

## 2022-10-01 ENCOUNTER — Ambulatory Visit (INDEPENDENT_AMBULATORY_CARE_PROVIDER_SITE_OTHER): Payer: BC Managed Care – PPO | Admitting: Family Medicine

## 2022-10-01 ENCOUNTER — Encounter: Payer: Self-pay | Admitting: Family Medicine

## 2022-10-01 VITALS — BP 132/70 | HR 78 | Resp 16 | Ht 74.0 in | Wt 235.0 lb

## 2022-10-01 DIAGNOSIS — R058 Other specified cough: Secondary | ICD-10-CM | POA: Diagnosis not present

## 2022-10-13 ENCOUNTER — Other Ambulatory Visit: Payer: Self-pay

## 2022-10-31 MED FILL — Glucose Blood Test Strip: 60 days supply | Qty: 300 | Fill #2 | Status: AC

## 2022-11-01 ENCOUNTER — Other Ambulatory Visit: Payer: Self-pay

## 2022-11-30 ENCOUNTER — Encounter: Payer: Self-pay | Admitting: Family Medicine

## 2022-11-30 ENCOUNTER — Ambulatory Visit: Payer: BC Managed Care – PPO | Admitting: Family Medicine

## 2022-11-30 VITALS — BP 122/70 | HR 71 | Temp 97.8°F | Resp 16 | Ht 74.0 in | Wt 239.3 lb

## 2022-11-30 DIAGNOSIS — N401 Enlarged prostate with lower urinary tract symptoms: Secondary | ICD-10-CM | POA: Diagnosis not present

## 2022-11-30 DIAGNOSIS — N521 Erectile dysfunction due to diseases classified elsewhere: Secondary | ICD-10-CM | POA: Diagnosis not present

## 2022-11-30 DIAGNOSIS — E785 Hyperlipidemia, unspecified: Secondary | ICD-10-CM

## 2022-11-30 DIAGNOSIS — E1049 Type 1 diabetes mellitus with other diabetic neurological complication: Secondary | ICD-10-CM

## 2022-11-30 DIAGNOSIS — R350 Frequency of micturition: Secondary | ICD-10-CM

## 2022-11-30 DIAGNOSIS — E559 Vitamin D deficiency, unspecified: Secondary | ICD-10-CM | POA: Diagnosis not present

## 2022-11-30 DIAGNOSIS — E291 Testicular hypofunction: Secondary | ICD-10-CM

## 2022-11-30 DIAGNOSIS — M7702 Medial epicondylitis, left elbow: Secondary | ICD-10-CM

## 2022-11-30 DIAGNOSIS — E139 Other specified diabetes mellitus without complications: Secondary | ICD-10-CM

## 2022-11-30 LAB — POCT GLYCOSYLATED HEMOGLOBIN (HGB A1C): Hemoglobin A1C: 7.2 % — AB (ref 4.0–5.6)

## 2022-11-30 NOTE — Patient Instructions (Signed)
Omnipod 5

## 2022-11-30 NOTE — Progress Notes (Signed)
Name: Ivan Booker   MRN: [id number]    DOB: 01-Jul-1966   Date:11/30/2022       Progress Note  Subjective  Chief Complaint  Follow Up  HPI  DMI with ED: he is doing well , A1C is down from 7.1 % to 6.8 % , 6.9 % and today is 7.2 %  He is using Dexcom 6  and it has helped because he sees the trend of glucose going up or down, he had at most 2 hypoglycemic episodes int he past month and is aware and able to intervene  No polyphagia, polyuria or polydipsia. ED he is back Revatio. He was diagnosed with DM as an young adult ( mid-20's ) and has good knowledge of his condition.  FSBS past 14 days is 144 . Discussed Omnipod and he will think about it   BPH: used to see Urologist - Dr. Bernardo Heater - years ago, had normal biopsies in the past. He was on Cialis but stopped working, taking sildenafil with goodrx now and doing well   Hypogonadism: He is not on testosterone supplementation anymore because insurance denied coverage.  Energy level continues to be good, he states normal libido He takes viagra prn   Hyperlipidemia: at goal, taking Atorvastatin every day, No complaints of cramping or chest pain. He has been taking 40 mg of atorvastatin, unable to tolerate higher dose and last LDL was at goal - below 70. Continue medication   Vitamin D deficiency: He is taking his vitamin D supplement daily.Unchanged   Golfer;s elbow: he is doing better, still has to stretch in am's and avoiding playing golf daily .   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 ([place]) 03/07/2015   Dyslipidemia 03/07/2015   Hypogonadism male 03/07/2015   Obesity (BMI 30.0-34.9) 03/07/2015   Allergic rhinitis, seasonal 03/07/2015   Vitamin D deficiency 03/07/2015    Past Surgical History:  Procedure Laterality Date   VASECTOMY  1993    Family History  Problem  Relation Age of Onset   COPD Father        Smoker    Bladder Cancer Neg Hx    Kidney cancer Neg Hx    Prostate cancer Neg Hx     Social History   Tobacco Use   Smoking status: Never   Smokeless tobacco: Never  Substance Use Topics   Alcohol use: Yes    Comment: 1-2 beers on weekends     Current Outpatient Medications:    albuterol (VENTOLIN HFA) 108 (90 Base) MCG/ACT inhaler, Inhale 2 puffs into the lungs every 6 (six) hours as needed for wheezing or shortness of breath., Disp: 8.5 g, Rfl: 0   aspirin 81 MG chewable tablet, Chew 1 tablet by mouth daily., Disp: , Rfl:    atorvastatin (LIPITOR) 40 MG tablet, Take 1 tablet (40 mg total) by mouth daily., Disp: 90 tablet, Rfl: 3   azelastine (ASTELIN) 0.1 % nasal spray, Instill 2 sprays in each nostril twice daily, Disp: 30 mL, Rfl: 12   benzonatate (TESSALON) 100 MG capsule, Take 1-2 capsules (100-200 mg total) by mouth 2 (two) times daily as needed., Disp: 40 capsule, Rfl: 0   Cholecalciferol (VITAMIN D) 2000 UNITS CAPS, Take 1 capsule by mouth daily., Disp: , Rfl:    Continuous Blood Gluc Sensor (DEXCOM G7 SENSOR) MISC, 1 each by Does  not apply route as directed. Every 10 days, Disp: 9 each, Rfl: 1   diclofenac Sodium (VOLTAREN) 1 % GEL, Apply 4 g topically 4 (four) times daily., Disp: 100 g, Rfl: 1   fluticasone (FLONASE) 50 MCG/ACT nasal spray, Instill 2 sprays in each nostril once daily, Disp: 16 g, Rfl: 0   Glucagon (GVOKE HYPOPEN 1-PACK) 1 MG/0.2ML SOAJ, Inject 1 mg into the skin daily as needed. May repeat in 15 minutes, Disp: 0.4 mL, Rfl: 1   glucose blood (FREESTYLE LITE) test strip, CHECK FASTING BLOOD SUGARS 5 TIMES DAILY, Disp: 300 strip, Rfl: 2   insulin degludec (TRESIBA FLEXTOUCH) 200 UNIT/ML FlexTouch Pen, INJECT 36 UNITS INTO THE SKIN DAILY., Disp: 9 mL, Rfl: 2   insulin lispro (HUMALOG KWIKPEN) 100 UNIT/ML KwikPen, INJECT 5 TO 16 UNITS INTO THE SKIN WITH MEALS, Disp: 45 mL, Rfl: 0   Insulin Pen Needle (UNIFINE PENTIPS)  31G X 8 MM MISC, USE AS DIRECTED, Disp: 100 each, Rfl: 3   Lancets (FREESTYLE) lancets, Use as instructed, Disp: 100 each, Rfl: 12   sildenafil (VIAGRA) 100 MG tablet, Take 0.5-1 tablets (50-100 mg total) by mouth daily as needed for erectile dysfunction., Disp: 90 tablet, Rfl: 0  No Known Allergies  I personally reviewed active problem list, medication list, allergies, family history, social history, health maintenance with the patient/caregiver today.   ROS  Ten systems reviewed and is negative except as mentioned in HPI   Objective  Vitals:   11/30/22 1440  BP: 122/70  Pulse: 71  Resp: 16  Temp: 97.8 F (36.6 C)  TempSrc: Oral  SpO2: 97%  Weight: 239 lb 4.8 oz (108.5 kg)  Height: '6\' 2"'$  (1.88 m)    Body mass index is 30.72 kg/m.  Physical Exam  Constitutional: Patient appears well-developed and well-nourished. Obese  No distress.  HEENT: head atraumatic, normocephalic, pupils equal and reactive to light,, neck supple Cardiovascular: Normal rate, regular rhythm and normal heart sounds.  No murmur heard. No BLE edema. Pulmonary/Chest: Effort normal and breath sounds normal. No respiratory distress. Abdominal: Soft.  There is no tenderness. Psychiatric: Patient has a normal mood and affect. behavior is normal. Judgment and thought content normal.   Recent Results (from the past 2160 hour(s))  CBC with Differential/Platelet     Status: None   Collection Time: 09/28/22  9:32 AM  Result Value Ref Range   WBC 6.6 3.8 - 10.8 Thousand/uL   RBC 5.42 4.20 - 5.80 Million/uL   Hemoglobin 16.5 13.2 - 17.1 g/dL   HCT 46.8 38.5 - 50.0 %   MCV 86.3 80.0 - 100.0 fL   MCH 30.4 27.0 - 33.0 pg   MCHC 35.3 32.0 - 36.0 g/dL   RDW 11.6 11.0 - 15.0 %   Platelets 195 140 - 400 Thousand/uL   MPV 10.1 7.5 - 12.5 fL   Neutro Abs 4,376 1,500 - 7,800 cells/uL   Lymphs Abs 1,366 850 - 3,900 cells/uL   Absolute Monocytes 766 200 - 950 cells/uL   Eosinophils Absolute 53 15 - 500 cells/uL    Basophils Absolute 40 0 - 200 cells/uL   Neutrophils Relative % 66.3 %   Total Lymphocyte 20.7 %   Monocytes Relative 11.6 %   Eosinophils Relative 0.8 %   Basophils Relative 0.6 %  BASIC METABOLIC PANEL WITH GFR     Status: Abnormal   Collection Time: 09/28/22  9:32 AM  Result Value Ref Range   Glucose, Bld 300 (H) 65 - 99  mg/dL    Comment: .            Fasting reference interval . For someone without known diabetes, a glucose value >125 mg/dL indicates that they may have diabetes and this should be confirmed with a follow-up test. .    BUN 14 7 - 25 mg/dL   Creat 0.99 0.70 - 1.30 mg/dL   eGFR 88 > OR = 60 mL/min/1.58m   BUN/Creatinine Ratio SEE NOTE: 6 - 22 (calc)    Comment:    Not Reported: BUN and Creatinine are within    reference range. .    Sodium 134 (L) 135 - 146 mmol/L   Potassium 4.2 3.5 - 5.3 mmol/L   Chloride 97 (L) 98 - 110 mmol/L   CO2 28 20 - 32 mmol/L   Calcium 8.9 8.6 - 10.3 mg/dL  POCT HgB A1C     Status: Abnormal   Collection Time: 11/30/22  2:47 PM  Result Value Ref Range   Hemoglobin A1C 7.2 (A) 4.0 - 5.6 %   HbA1c POC (<> result, manual entry)     HbA1c, POC (prediabetic range)     HbA1c, POC (controlled diabetic range)      PHQ2/9:    11/30/2022    2:39 PM 10/01/2022    3:05 PM 09/28/2022    9:06 AM 08/17/2022    2:37 PM 05/18/2022    2:49 PM  Depression screen PHQ 2/9  Decreased Interest 0 0 0 0 0  Down, Depressed, Hopeless 0 0 0 0 0  PHQ - 2 Score 0 0 0 0 0  Altered sleeping 0 0 0 0 0  Tired, decreased energy 0 0 0 0 0  Change in appetite 0 0 0 0 0  Feeling bad or failure about yourself  0 0 0 0 0  Trouble concentrating 0 0 0 0 0  Moving slowly or fidgety/restless 0 0 0 0 0  Suicidal thoughts 0 0 0 0 0  PHQ-9 Score 0 0 0 0 0    phq 9 is negative   Fall Risk:    11/30/2022    2:39 PM 10/01/2022    3:05 PM 09/28/2022    9:05 AM 08/17/2022    2:37 PM 05/18/2022    2:49 PM  Fall Risk   Falls in the past year? 0 0 0 0 0  Number falls  in past yr:  0 0 0 0  Injury with Fall?  0 0 0 0  Risk for fall due to : No Fall Risks No Fall Risks No Fall Risks No Fall Risks No Fall Risks  Follow up Falls prevention discussed Falls prevention discussed Falls prevention discussed Falls prevention discussed Falls prevention discussed      Functional Status Survey: Is the patient deaf or have difficulty hearing?: No Does the patient have difficulty seeing, even when wearing glasses/contacts?: No Does the patient have difficulty concentrating, remembering, or making decisions?: No Does the patient have difficulty walking or climbing stairs?: No Does the patient have difficulty dressing or bathing?: No Does the patient have difficulty doing errands alone such as visiting a doctor's office or shopping?: No    Assessment & Plan  1. 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 LADA (latent autoimmune diabetes in adults), managed as type 1 ([facility]  Consider Omnipod  3. Dyslipidemia   4. Vitamin D deficiency  Continue supplementation  5. Benign prostatic hyperplasia with  urinary frequency  Stable   6. Golfer's elbow, left  Improved  7. Hypogonadism male  Doing well at this time

## 2022-12-07 DIAGNOSIS — H524 Presbyopia: Secondary | ICD-10-CM | POA: Diagnosis not present

## 2022-12-07 DIAGNOSIS — E119 Type 2 diabetes mellitus without complications: Secondary | ICD-10-CM | POA: Diagnosis not present

## 2022-12-07 DIAGNOSIS — H25013 Cortical age-related cataract, bilateral: Secondary | ICD-10-CM | POA: Diagnosis not present

## 2022-12-07 LAB — HM DIABETES EYE EXAM

## 2022-12-09 NOTE — Progress Notes (Signed)
Name: Ivan Booker   MRN: [id number]    DOB: 1966-03-28   Date:12/10/2022       Progress Note  Subjective  Chief Complaint  Left Foot  HPI  Patient states he has intermittent paresthesia on both feet but worse on the left, he states seems to be more aggravating after playing golf and this week it was more sore/present on lateral aspect of left foot and also has some erythema. He states symptoms are not constant but bothersome. He has a long history of type 1 diabetes.    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 ([place]) 03/07/2015   Dyslipidemia 03/07/2015   Hypogonadism male 03/07/2015   Obesity (BMI 30.0-34.9) 03/07/2015   Allergic rhinitis, seasonal 03/07/2015   Vitamin D deficiency 03/07/2015    Past Surgical History:  Procedure Laterality Date   VASECTOMY  1993    Family History  Problem Relation Age of Onset   COPD Father        Smoker    Bladder Cancer Neg Hx    Kidney cancer Neg Hx    Prostate cancer Neg Hx     Social History   Tobacco Use   Smoking status: Never   Smokeless tobacco: Never  Substance Use Topics   Alcohol use: Yes    Comment: 1-2 beers on weekends     Current Outpatient Medications:    albuterol (VENTOLIN HFA) 108 (90 Base) MCG/ACT inhaler, Inhale 2 puffs into the lungs every 6 (six) hours as needed for wheezing or shortness of breath., Disp: 8.5 g, Rfl: 0   aspirin 81 MG chewable tablet, Chew 1 tablet by mouth daily., Disp: , Rfl:    atorvastatin (LIPITOR) 40 MG tablet, Take 1 tablet (40 mg total) by mouth daily., Disp: 90 tablet, Rfl: 3   azelastine (ASTELIN) 0.1 % nasal spray, Instill 2 sprays in each nostril twice daily, Disp: 30 mL, Rfl: 12   Cholecalciferol (VITAMIN D) 2000 UNITS CAPS, Take 1 capsule by mouth daily., Disp: , Rfl:    Continuous Blood Gluc Sensor ([place]) MISC, 1 each by Does not apply route as directed. Every 10 days, Disp: 9 each, Rfl: 1   diclofenac Sodium (VOLTAREN) 1 % GEL, Apply 4 g topically 4 (four) times daily., Disp: 100 g, Rfl: 1   fluticasone (FLONASE) 50 MCG/ACT nasal spray, Instill 2 sprays in each nostril once daily, Disp: 16 g, Rfl: 0   Glucagon (GVOKE HYPOPEN 1-PACK) 1 MG/0.2ML SOAJ, Inject 1 mg into the skin daily as needed. May repeat in 15 minutes, Disp: 0.4 mL, Rfl: 1   glucose blood (FREESTYLE LITE) test strip, CHECK FASTING BLOOD SUGARS 5 TIMES DAILY, Disp: 300 strip, Rfl: 2   insulin degludec (TRESIBA FLEXTOUCH) 200 UNIT/ML FlexTouch Pen, INJECT 36 UNITS INTO THE SKIN DAILY., Disp: 9 mL, Rfl: 2   insulin lispro (HUMALOG KWIKPEN) 100 UNIT/ML KwikPen, INJECT 5 TO 16 UNITS INTO THE SKIN WITH MEALS, Disp: 45 mL, Rfl: 0   Insulin Pen Needle (UNIFINE PENTIPS) 31G X 8 MM MISC, USE AS DIRECTED, Disp: 100 each, Rfl: 3   Lancets (FREESTYLE) lancets, Use as instructed, Disp: 100 each, Rfl: 12   pregabalin (LYRICA) 50 MG capsule, Take 1 capsule (50 mg total) by mouth 3 (three) times daily. Start qhs for the first few days and titrate up as tolerated  to up to three times a day, Disp: 90 capsule, Rfl: 0   sildenafil (VIAGRA) 100 MG tablet, Take 0.5-1 tablets (50-100 mg total) by mouth daily as needed for erectile dysfunction., Disp: 90 tablet, Rfl: 0  No Known Allergies  I personally reviewed active problem list, medication list, allergies, family history, social history, health maintenance with the patient/caregiver today.   ROS  Ten systems reviewed and is negative except as mentioned in HPI   Objective  Vitals:   12/10/22 1436  BP: 126/72  Pulse: 91  Resp: 18  Temp: 98.1 F (36.7 C)  TempSrc: Oral  SpO2: 98%  Weight: 237 lb 6.4 oz (107.7 kg)  Height: 6\' 2"  (1.88 m)    Body mass index is 30.48 kg/m.  Physical Exam  Constitutional: Patient appears well-developed and well-nourished. No distress.  HEENT: head  atraumatic, normocephalic, pupils equal and reactive to light,, neck supple Cardiovascular: Normal rate, regular rhythm and normal heart sounds.  No murmur heard. No BLE edema. Pulmonary/Chest: Effort normal and breath sounds normal. No respiratory distress. Abdominal: Soft.  There is no tenderness. Psychiatric: Patient has a normal mood and affect. behavior is normal. Judgment and thought content normal.   Recent Results (from the past 2160 hour(s))  CBC with Differential/Platelet     Status: None   Collection Time: 09/28/22  9:32 AM  Result Value Ref Range   WBC 6.6 3.8 - 10.8 Thousand/uL   RBC 5.42 4.20 - 5.80 Million/uL   Hemoglobin 16.5 13.2 - 17.1 g/dL   HCT 46.8 38.5 - 50.0 %   MCV 86.3 80.0 - 100.0 fL   MCH 30.4 27.0 - 33.0 pg   MCHC 35.3 32.0 - 36.0 g/dL   RDW 11.6 11.0 - 15.0 %   Platelets 195 140 - 400 Thousand/uL   MPV 10.1 7.5 - 12.5 fL   Neutro Abs 4,376 1,500 - 7,800 cells/uL   Lymphs Abs 1,366 850 - 3,900 cells/uL   Absolute Monocytes 766 200 - 950 cells/uL   Eosinophils Absolute 53 15 - 500 cells/uL   Basophils Absolute 40 0 - 200 cells/uL   Neutrophils Relative % 66.3 %   Total Lymphocyte 20.7 %   Monocytes Relative 11.6 %   Eosinophils Relative 0.8 %   Basophils Relative 0.6 %  BASIC METABOLIC PANEL WITH GFR     Status: Abnormal   Collection Time: 09/28/22  9:32 AM  Result Value Ref Range   Glucose, Bld 300 (H) 65 - 99 mg/dL    Comment: .            Fasting reference interval . For someone without known diabetes, a glucose value >125 mg/dL indicates that they may have diabetes and this should be confirmed with a follow-up test. .    BUN 14 7 - 25 mg/dL   Creat 0.99 0.70 - 1.30 mg/dL   eGFR 88 > OR = 60 mL/min/1.49m2   BUN/Creatinine Ratio SEE NOTE: 6 - 22 (calc)    Comment:    Not Reported: BUN and Creatinine are within    reference range. .    Sodium 134 (L) 135 - 146 mmol/L   Potassium 4.2 3.5 - 5.3 mmol/L   Chloride 97 (L) 98 - 110 mmol/L    CO2 28 20 - 32 mmol/L   Calcium 8.9 8.6 - 10.3 mg/dL  POCT HgB A1C     Status: Abnormal   Collection Time: 11/30/22  2:47 PM  Result Value Ref Range   Hemoglobin  A1C 7.2 (A) 4.0 - 5.6 %   HbA1c POC (<> result, manual entry)     HbA1c, POC (prediabetic range)     HbA1c, POC (controlled diabetic range)    HM DIABETES EYE EXAM     Status: None   Collection Time: 12/07/22 12:00 AM  Result Value Ref Range   HM Diabetic Eye Exam No Retinopathy No Retinopathy    Diabetic Foot Exam: Diabetic Foot Exam - Simple   Simple Foot Form Visual Inspection No deformities, no ulcerations, no other skin breakdown bilaterally: Yes Sensation Testing Intact to touch and monofilament testing bilaterally: Yes Pulse Check Posterior Tibialis and Dorsalis pulse intact bilaterally: Yes Comments      PHQ2/9:    12/10/2022    2:44 PM 11/30/2022    2:39 PM 10/01/2022    3:05 PM 09/28/2022    9:06 AM 08/17/2022    2:37 PM  Depression screen PHQ 2/9  Decreased Interest 0 0 0 0 0  Down, Depressed, Hopeless 0 0 0 0 0  PHQ - 2 Score 0 0 0 0 0  Altered sleeping 0 0 0 0 0  Tired, decreased energy 0 0 0 0 0  Change in appetite 0 0 0 0 0  Feeling bad or failure about yourself  0 0 0 0 0  Trouble concentrating 0 0 0 0 0  Moving slowly or fidgety/restless 0 0 0 0 0  Suicidal thoughts 0 0 0 0 0  PHQ-9 Score 0 0 0 0 0    phq 9 is negative   Fall Risk:    12/10/2022    2:44 PM 11/30/2022    2:39 PM 10/01/2022    3:05 PM 09/28/2022    9:05 AM 08/17/2022    2:37 PM  Fall Risk   Falls in the past year? 0 0 0 0 0  Number falls in past yr:   0 0 0  Injury with Fall?   0 0 0  Risk for fall due to : No Fall Risks No Fall Risks No Fall Risks No Fall Risks No Fall Risks  Follow up Falls prevention discussed Falls prevention discussed Falls prevention discussed Falls prevention discussed Falls prevention discussed       Assessment & Plan  1. 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 Start qhs for the first few days and titrate up as tolerated to up to three times a day  Dispense: 90 capsule; Refill: 0  Discussed possible side effects we will check B12 and folate and if low he will not fill Lyrica, we will replace it first, otherwise he will start lyrica and titrate up slowly   2. Paresthesia of both feet  - B12 and Folate Panel - pregabalin (LYRICA) 50 MG capsule; Take 1 capsule (50 mg total) by mouth 3 (three) times daily. Start qhs for the first few days and titrate up as tolerated to up to three times a day  Dispense: 90 capsule; Refill: 0

## 2022-12-10 ENCOUNTER — Ambulatory Visit (INDEPENDENT_AMBULATORY_CARE_PROVIDER_SITE_OTHER): Payer: BC Managed Care – PPO | Admitting: Family Medicine

## 2022-12-10 ENCOUNTER — Other Ambulatory Visit: Payer: Self-pay

## 2022-12-10 ENCOUNTER — Encounter: Payer: Self-pay | Admitting: Family Medicine

## 2022-12-10 VITALS — BP 126/72 | HR 91 | Temp 98.1°F | Resp 18 | Ht 74.0 in | Wt 237.4 lb

## 2022-12-10 DIAGNOSIS — R350 Frequency of micturition: Secondary | ICD-10-CM | POA: Diagnosis not present

## 2022-12-10 DIAGNOSIS — E1049 Type 1 diabetes mellitus with other diabetic neurological complication: Secondary | ICD-10-CM

## 2022-12-10 DIAGNOSIS — N521 Erectile dysfunction due to diseases classified elsewhere: Secondary | ICD-10-CM

## 2022-12-10 DIAGNOSIS — N401 Enlarged prostate with lower urinary tract symptoms: Secondary | ICD-10-CM | POA: Diagnosis not present

## 2022-12-10 DIAGNOSIS — E538 Deficiency of other specified B group vitamins: Secondary | ICD-10-CM | POA: Diagnosis not present

## 2022-12-10 DIAGNOSIS — R202 Paresthesia of skin: Secondary | ICD-10-CM | POA: Diagnosis not present

## 2022-12-10 DIAGNOSIS — E139 Other specified diabetes mellitus without complications: Secondary | ICD-10-CM | POA: Diagnosis not present

## 2022-12-10 DIAGNOSIS — E785 Hyperlipidemia, unspecified: Secondary | ICD-10-CM | POA: Diagnosis not present

## 2022-12-10 MED ORDER — PREGABALIN 50 MG PO CAPS
50.0000 mg | ORAL_CAPSULE | Freq: Three times a day (TID) | ORAL | 0 refills | Status: DC
  Filled 2022-12-10: qty 90, 30d supply, fill #0

## 2022-12-11 LAB — B12 AND FOLATE PANEL
Folate: 9.3 ng/mL
Vitamin B-12: 349 pg/mL (ref 200–1100)

## 2022-12-13 ENCOUNTER — Ambulatory Visit: Payer: Self-pay

## 2022-12-13 NOTE — Telephone Encounter (Signed)
  Chief Complaint: Medication question Symptoms:  Frequency:  Pertinent Negatives: Patient denies  Disposition: [] ED /[] Urgent Care (no appt availability in office) / [] Appointment(In office/virtual)/ []  [facility]/ [] Home Care/ [] Refused Recommended Disposition /[] [facility]/ [x]  Follow-up with PCP Additional Notes: PT states that Dr. Ancil Boozer discussed medication changes based on B12 levels at recent OV. Pt would like to know which medications he should be taking now that lab results are back.  Please advise.    Summary: med?   Patient called in says he has gotten is b12 results and based on that , wants to know if Dr Ancil Boozer is gonna change his med for diabetic neuropathy. He doesn't know the name of it        Reason for Disposition  [1] Pharmacy calling with prescription question AND [2] triager unable to answer question  Answer Assessment - Initial Assessment Questions 1. NAME of MEDICINE: "What medicine(s) are you calling about?"     Unsure 2. QUESTION: "What is your question?" (e.g., double dose of medicine, side effect)     What meds should pt be taking? 3. PRESCRIBER: "Who prescribed the medicine?" Reason: if prescribed by specialist, call should be referred to that group.     Dr. Ancil Boozer 4. SYMPTOMS: "Do you have any symptoms?" If Yes, ask: "What symptoms are you having?"  "How bad are the symptoms (e.g., mild, moderate, severe)  Protocols used: Medication Question Call-A-AH

## 2022-12-14 ENCOUNTER — Other Ambulatory Visit: Payer: Self-pay

## 2022-12-14 ENCOUNTER — Other Ambulatory Visit: Payer: Self-pay | Admitting: Family Medicine

## 2022-12-14 DIAGNOSIS — E1069 Type 1 diabetes mellitus with other specified complication: Secondary | ICD-10-CM

## 2022-12-14 DIAGNOSIS — E1049 Type 1 diabetes mellitus with other diabetic neurological complication: Secondary | ICD-10-CM

## 2022-12-14 MED ORDER — FREESTYLE LITE TEST VI STRP
ORAL_STRIP | 2 refills | Status: DC
  Filled 2022-12-14: qty 300, 60d supply, fill #0
  Filled 2023-02-02: qty 300, 60d supply, fill #1
  Filled 2023-03-23: qty 300, 60d supply, fill #2

## 2022-12-14 NOTE — Telephone Encounter (Signed)
Spoke with patient and he will try B-12 for a month and then add pregabalin if needed.

## 2022-12-16 ENCOUNTER — Other Ambulatory Visit: Payer: Self-pay

## 2022-12-20 ENCOUNTER — Other Ambulatory Visit: Payer: Self-pay

## 2022-12-28 ENCOUNTER — Other Ambulatory Visit: Payer: Self-pay

## 2022-12-28 ENCOUNTER — Other Ambulatory Visit: Payer: Self-pay | Admitting: Family Medicine

## 2022-12-28 DIAGNOSIS — E1049 Type 1 diabetes mellitus with other diabetic neurological complication: Secondary | ICD-10-CM

## 2022-12-28 MED ORDER — INSULIN LISPRO (1 UNIT DIAL) 100 UNIT/ML (KWIKPEN)
5.0000 [IU] | PEN_INJECTOR | Freq: Three times a day (TID) | SUBCUTANEOUS | 0 refills | Status: DC
  Filled 2022-12-28: qty 45, 94d supply, fill #0

## 2023-01-11 ENCOUNTER — Other Ambulatory Visit: Payer: Self-pay

## 2023-01-12 ENCOUNTER — Other Ambulatory Visit: Payer: Self-pay | Admitting: Family Medicine

## 2023-01-12 ENCOUNTER — Other Ambulatory Visit: Payer: Self-pay

## 2023-01-12 DIAGNOSIS — E1049 Type 1 diabetes mellitus with other diabetic neurological complication: Secondary | ICD-10-CM

## 2023-01-12 MED ORDER — UNIFINE PENTIPS 31G X 8 MM MISC
3 refills | Status: DC
  Filled 2023-01-12: qty 100, 25d supply, fill #0
  Filled 2023-03-15: qty 100, 25d supply, fill #1
  Filled 2023-05-26: qty 100, 25d supply, fill #2
  Filled 2023-07-26: qty 100, 25d supply, fill #3

## 2023-01-13 ENCOUNTER — Telehealth: Payer: Self-pay | Admitting: Family Medicine

## 2023-01-13 NOTE — Telephone Encounter (Signed)
Copied from CRM [id number]. Topic: General - Other >> Jan 13, 2023  1:36 PM [place] B wrote: Reason for CRM: patient called in about  b12 shots  only helping a little and has  tingling his feet at times and he wants to start on the tablets. Please call back to discuss

## 2023-01-14 NOTE — Telephone Encounter (Signed)
Left detailed vm °

## 2023-01-17 ENCOUNTER — Other Ambulatory Visit: Payer: Self-pay | Admitting: Family Medicine

## 2023-01-23 ENCOUNTER — Other Ambulatory Visit: Payer: Self-pay

## 2023-01-23 ENCOUNTER — Other Ambulatory Visit: Payer: Self-pay | Admitting: Family Medicine

## 2023-01-23 DIAGNOSIS — E1049 Type 1 diabetes mellitus with other diabetic neurological complication: Secondary | ICD-10-CM

## 2023-01-23 MED ORDER — TRESIBA FLEXTOUCH 200 UNIT/ML ~~LOC~~ SOPN
PEN_INJECTOR | SUBCUTANEOUS | 2 refills | Status: DC
  Filled 2023-01-23: qty 9, 50d supply, fill #0
  Filled 2023-03-15: qty 9, 50d supply, fill #1
  Filled 2023-05-05: qty 9, 50d supply, fill #2

## 2023-01-24 ENCOUNTER — Other Ambulatory Visit: Payer: Self-pay

## 2023-02-02 ENCOUNTER — Other Ambulatory Visit: Payer: Self-pay

## 2023-02-07 ENCOUNTER — Other Ambulatory Visit: Payer: Self-pay

## 2023-03-07 NOTE — Progress Notes (Unsigned)
Name: Ivan Booker   MRN: [id number]    DOB: [birth year]   Date:03/08/2023       Progress Note  Subjective  Chief Complaint  Follow Up  HPI  DMI with ED: he is doing well , A1C is down from 7.1 % to 6.8 % , 6.9 % and today is 7.2 %  He states not using Dexcom over the past few weeks due to traveling and forgetting to re-order it, she checks his FSBS multiple times a day and 30 days average was 127 and 90 days was 140   No polyphagia, polyuria or polydipsia. ED he is back Revatio. He was diagnosed with DM as an young adult ( mid-20's ) and has good knowledge of his condition.   BPH: used to see Urologist - Dr. Lonna Cobb - years ago, had normal biopsies in the past. He was on Cialis but stopped working, taking sildenafil with goodrx now and doing well We will recheck PSA during CPE   Paresthesia on both feet but worse on the left:  he states seems to be more aggravating after playing golf he also noticed some erythema, we gave him  Lyrica and he has been taking twice daily and symptoms are significantly better   Hypogonadism: He is not on testosterone supplementation anymore because insurance denied coverage.  Energy level continues to be good, he states normal libido He takes viagra prn   Hyperlipidemia: at goal, taking Atorvastatin every day, No complaints of cramping or chest pain. He has been taking 40 mg of atorvastatin, unable to tolerate higher dose and last LDL was at goal - below 70. We will recheck labs during his CPE  Vitamin D deficiency: He is taking his vitamin D supplement daily.   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) 03/07/2015   Dyslipidemia 03/07/2015    Hypogonadism male 03/07/2015   Obesity (BMI 30.0-34.9) 03/07/2015   Allergic rhinitis, seasonal 03/07/2015   Vitamin D deficiency 03/07/2015    Past Surgical History:  Procedure Laterality Date   VASECTOMY  1993    Family History  Problem Relation Age of Onset   COPD Father        Smoker    Bladder Cancer Neg Hx    Kidney cancer Neg Hx    Prostate cancer Neg Hx     Social History   Tobacco Use   Smoking status: Never   Smokeless tobacco: Never  Substance Use Topics   Alcohol use: Yes    Comment: 1-2 beers on weekends     Current Outpatient Medications:    aspirin 81 MG chewable tablet, Chew 1 tablet by mouth daily., Disp: , Rfl:    atorvastatin (LIPITOR) 40 MG tablet, Take 1 tablet (40 mg total) by mouth daily., Disp: 90 tablet, Rfl: 3   Cholecalciferol (VITAMIN D) 2000 UNITS CAPS, Take 1 capsule by mouth daily., Disp: , Rfl:    Continuous Blood Gluc Sensor (DEXCOM G7 SENSOR) MISC, 1 each by Does not apply route as directed. Every 10 days, Disp: 9 each, Rfl: 1   diclofenac Sodium (VOLTAREN) 1 % GEL, Apply 4 g topically 4 (four) times daily., Disp: 100 g, Rfl:  1   Glucagon (GVOKE HYPOPEN 1-PACK) 1 MG/0.2ML SOAJ, Inject 1 mg into the skin daily as needed. May repeat in 15 minutes, Disp: 0.4 mL, Rfl: 1   glucose blood (FREESTYLE LITE) test strip, CHECK FASTING BLOOD SUGARS 5 TIMES DAILY, Disp: 300 strip, Rfl: 2   insulin degludec (TRESIBA FLEXTOUCH) 200 UNIT/ML FlexTouch Pen, INJECT 36 UNITS INTO THE SKIN DAILY., Disp: 9 mL, Rfl: 2   insulin lispro (HUMALOG KWIKPEN) 100 UNIT/ML KwikPen, Inject 5-16 Units into the skin 3 (three) times daily with meals., Disp: 45 mL, Rfl: 0   Insulin Pen Needle (UNIFINE PENTIPS) 31G X 8 MM MISC, USE AS DIRECTED, Disp: 100 each, Rfl: 3   Lancets (FREESTYLE) lancets, Use as instructed, Disp: 100 each, Rfl: 12   sildenafil (VIAGRA) 100 MG tablet, Take 0.5-1 tablets (50-100 mg total) by mouth daily as needed for erectile dysfunction., Disp: 90  tablet, Rfl: 0   pregabalin (LYRICA) 50 MG capsule, Take 1 capsule (50 mg total) by mouth 2 (two) times daily., Disp: 180 capsule, Rfl: 0  No Known Allergies  I personally reviewed active problem list, medication list, allergies, family history, social history, health maintenance with the patient/caregiver today.   ROS  Ten systems reviewed and is negative except as mentioned in HPI   Objective  Vitals:   03/08/23 1544  BP: 116/68  Pulse: 73  Resp: 16  Weight: 238 lb (108 kg)  Height: 6\' 2"  (1.88 m)    Body mass index is 30.56 kg/m.  Physical Exam  Constitutional: Patient appears well-developed and well-nourished. Obese  No distress.  HEENT: head atraumatic, normocephalic, pupils equal and reactive to light, neck supple Cardiovascular: Normal rate, regular rhythm and normal heart sounds.  No murmur heard. No BLE edema. Pulmonary/Chest: Effort normal and breath sounds normal. No respiratory distress. Abdominal: Soft.  There is no tenderness. Psychiatric: Patient has a normal mood and affect. behavior is normal. Judgment and thought content normal.   Recent Results (from the past 2160 hour(s))  B12 and Folate Panel     Status: None   Collection Time: 12/10/22  3:36 PM  Result Value Ref Range   Vitamin B-12 349 200 - 1,100 pg/mL    Comment: . Please Note: Although the reference range for vitamin B12 is [phone] pg/mL, it has been reported that between 5 and 10% of patients with values between 200 and 400 pg/mL may experience neuropsychiatric and hematologic abnormalities due to occult B12 deficiency; less than 1% of patients with values above 400 pg/mL will have symptoms. .    Folate 9.3 ng/mL    Comment:                            Reference Range                            Low:           <3.4                            Borderline:    3.4-5.4                            Normal:        >5.4 .   POCT HgB A1C     Status: Abnormal   Collection Time: 03/08/23  3:50 PM   Result Value Ref Range   Hemoglobin A1C 6.3 (A) 4.0 - 5.6 %   HbA1c POC (<> result, manual entry)     HbA1c, POC (prediabetic range)     HbA1c, POC (controlled diabetic range)      Diabetic Foot Exam: Diabetic Foot Exam - Simple   Simple Foot Form Visual Inspection No deformities, no ulcerations, no other skin breakdown bilaterally: Yes Sensation Testing Intact to touch and monofilament testing bilaterally: Yes Pulse Check Posterior Tibialis and Dorsalis pulse intact bilaterally: Yes Comments     PHQ2/9:    03/08/2023    3:45 PM 12/10/2022    2:44 PM 11/30/2022    2:39 PM 10/01/2022    3:05 PM 09/28/2022    9:06 AM  Depression screen PHQ 2/9  Decreased Interest 0 0 0 0 0  Down, Depressed, Hopeless 0 0 0 0 0  PHQ - 2 Score 0 0 0 0 0  Altered sleeping 0 0 0 0 0  Tired, decreased energy 0 0 0 0 0  Change in appetite 0 0 0 0 0  Feeling bad or failure about yourself  0 0 0 0 0  Trouble concentrating 0 0 0 0 0  Moving slowly or fidgety/restless 0 0 0 0 0  Suicidal thoughts 0 0 0 0 0  PHQ-9 Score 0 0 0 0 0    phq 9 is negative   Fall Risk:    03/08/2023    3:45 PM 12/10/2022    2:44 PM 11/30/2022    2:39 PM 10/01/2022    3:05 PM 09/28/2022    9:05 AM  Fall Risk   Falls in the past year? 0 0 0 0 0  Number falls in past yr: 0   0 0  Injury with Fall? 0   0 0  Risk for fall due to : No Fall Risks No Fall Risks No Fall Risks No Fall Risks No Fall Risks  Follow up Falls prevention discussed Falls prevention discussed Falls prevention discussed Falls prevention discussed Falls prevention discussed      Functional Status Survey: Is the patient deaf or have difficulty hearing?: No Does the patient have difficulty seeing, even when wearing glasses/contacts?: No Does the patient have difficulty concentrating, remembering, or making decisions?: No Does the patient have difficulty walking or climbing stairs?: No Does the patient have difficulty dressing or bathing?: No Does the  patient have difficulty doing errands alone such as visiting a doctor's office or shopping?: No    Assessment & Plan  1. 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  Dispense: 180 capsule; Refill: 0  2. Paresthesia of both feet  - pregabalin (LYRICA) 50 MG capsule; Take 1 capsule (50 mg total) by mouth 2 (two) times daily.  Dispense: 180 capsule; Refill: 0  3. Dyslipidemia   4. Golfer's elbow, left  He is doing much better now   5. LADA (latent autoimmune diabetes in adults), managed as type 1 (HCC)   6. Benign prostatic hyperplasia with urinary frequency   Stable

## 2023-03-08 ENCOUNTER — Encounter: Payer: Self-pay | Admitting: Family Medicine

## 2023-03-08 ENCOUNTER — Ambulatory Visit (INDEPENDENT_AMBULATORY_CARE_PROVIDER_SITE_OTHER): Payer: BC Managed Care – PPO | Admitting: Family Medicine

## 2023-03-08 ENCOUNTER — Other Ambulatory Visit: Payer: Self-pay

## 2023-03-08 VITALS — BP 116/68 | HR 73 | Resp 16 | Ht 74.0 in | Wt 238.0 lb

## 2023-03-08 DIAGNOSIS — N521 Erectile dysfunction due to diseases classified elsewhere: Secondary | ICD-10-CM | POA: Diagnosis not present

## 2023-03-08 DIAGNOSIS — N401 Enlarged prostate with lower urinary tract symptoms: Secondary | ICD-10-CM | POA: Diagnosis not present

## 2023-03-08 DIAGNOSIS — R350 Frequency of micturition: Secondary | ICD-10-CM

## 2023-03-08 DIAGNOSIS — M7702 Medial epicondylitis, left elbow: Secondary | ICD-10-CM

## 2023-03-08 DIAGNOSIS — R202 Paresthesia of skin: Secondary | ICD-10-CM

## 2023-03-08 DIAGNOSIS — E785 Hyperlipidemia, unspecified: Secondary | ICD-10-CM | POA: Diagnosis not present

## 2023-03-08 DIAGNOSIS — E139 Other specified diabetes mellitus without complications: Secondary | ICD-10-CM

## 2023-03-08 DIAGNOSIS — E1049 Type 1 diabetes mellitus with other diabetic neurological complication: Secondary | ICD-10-CM | POA: Diagnosis not present

## 2023-03-08 LAB — POCT GLYCOSYLATED HEMOGLOBIN (HGB A1C): Hemoglobin A1C: 6.3 % — AB (ref 4.0–5.6)

## 2023-03-08 MED ORDER — PREGABALIN 50 MG PO CAPS
50.0000 mg | ORAL_CAPSULE | Freq: Two times a day (BID) | ORAL | 0 refills | Status: DC
  Filled 2023-03-08: qty 180, 90d supply, fill #0

## 2023-03-17 ENCOUNTER — Other Ambulatory Visit: Payer: Self-pay

## 2023-03-23 ENCOUNTER — Other Ambulatory Visit: Payer: Self-pay

## 2023-03-23 ENCOUNTER — Other Ambulatory Visit: Payer: Self-pay | Admitting: Family Medicine

## 2023-03-23 DIAGNOSIS — E1049 Type 1 diabetes mellitus with other diabetic neurological complication: Secondary | ICD-10-CM

## 2023-03-23 DIAGNOSIS — E1069 Type 1 diabetes mellitus with other specified complication: Secondary | ICD-10-CM

## 2023-03-23 NOTE — Telephone Encounter (Unsigned)
Copied from CRM [id number]. Topic: General - Other >> Mar 23, 2023  3:47 PM Everette C wrote: Reason for CRM: Medication Refill - Medication: Rx #: [id number]  glucose blood (FREESTYLE LITE) test strip [[id number]]    Has the patient contacted their pharmacy? Yes.  The patient would like to be contacted to further discuss modifying the quantity they are prescribed  (Agent: If no, request that the patient contact the pharmacy for the refill. If patient does not wish to contact the pharmacy document the reason why and proceed with request.) (Agent: If yes, when and what did the pharmacy advise?)  Preferred Pharmacy (with phone number or street name): [facility] [address] [place] [address] Phone: [phone] Fax: [phone] Hours: M-F 7:30a-5:30p   Has the patient been seen for an appointment in the last year OR does the patient have an upcoming appointment? Yes.    Agent: Please be advised that RX refills may take up to 3 business days. We ask that you follow-up with your pharmacy.

## 2023-03-24 ENCOUNTER — Other Ambulatory Visit: Payer: Self-pay | Admitting: Family Medicine

## 2023-03-24 ENCOUNTER — Other Ambulatory Visit: Payer: Self-pay

## 2023-03-24 DIAGNOSIS — E538 Deficiency of other specified B group vitamins: Secondary | ICD-10-CM | POA: Insufficient documentation

## 2023-03-24 MED ORDER — FREESTYLE LITE TEST VI STRP
ORAL_STRIP | 0 refills | Status: DC
  Filled 2023-03-24: qty 300, fill #0
  Filled 2023-03-28: qty 100, 20d supply, fill #0

## 2023-03-24 NOTE — Telephone Encounter (Signed)
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   In 2 months [name], MD [facility]

## 2023-03-25 ENCOUNTER — Other Ambulatory Visit: Payer: Self-pay | Admitting: Family Medicine

## 2023-03-25 DIAGNOSIS — E1049 Type 1 diabetes mellitus with other diabetic neurological complication: Secondary | ICD-10-CM

## 2023-03-27 ENCOUNTER — Other Ambulatory Visit: Payer: Self-pay

## 2023-03-27 ENCOUNTER — Other Ambulatory Visit: Payer: Self-pay | Admitting: Family Medicine

## 2023-03-27 DIAGNOSIS — E1049 Type 1 diabetes mellitus with other diabetic neurological complication: Secondary | ICD-10-CM

## 2023-03-27 MED FILL — Insulin Lispro Soln Pen-injector 100 Unit/ML (1 Unit Dial): SUBCUTANEOUS | 94 days supply | Qty: 45 | Fill #0 | Status: CN

## 2023-03-28 ENCOUNTER — Telehealth: Payer: Self-pay

## 2023-03-28 ENCOUNTER — Other Ambulatory Visit: Payer: Self-pay

## 2023-03-28 DIAGNOSIS — E1049 Type 1 diabetes mellitus with other diabetic neurological complication: Secondary | ICD-10-CM

## 2023-03-28 DIAGNOSIS — E1069 Type 1 diabetes mellitus with other specified complication: Secondary | ICD-10-CM

## 2023-03-28 MED ORDER — FREESTYLE LITE TEST VI STRP
ORAL_STRIP | 0 refills | Status: DC
Start: 2023-03-28 — End: 2023-03-28
  Filled 2023-03-28: qty 300, fill #0

## 2023-03-28 MED ORDER — FREESTYLE LITE TEST VI STRP
ORAL_STRIP | 0 refills | Status: DC
Start: 2023-03-28 — End: 2023-05-26
  Filled 2023-03-28: qty 300, 50d supply, fill #0

## 2023-03-28 MED FILL — Insulin Lispro Soln Pen-injector 100 Unit/ML (1 Unit Dial): SUBCUTANEOUS | 90 days supply | Qty: 45 | Fill #0 | Status: AC

## 2023-03-28 NOTE — Addendum Note (Signed)
Addended by: Ross Ludwig on: 03/28/2023 05:57 PM   Modules accepted: Orders

## 2023-03-28 NOTE — Telephone Encounter (Signed)
Pt had called in with Danielle, [facility] agent and said he is having problem getting Freestyle Test Strips refilled. Rx was sent to [facility] on 03/24/23 #300. Advised her to let pt know I would call pharmacy and then call pt back. [facility] and spoke with Aundra Millet, RX Tech advised her of above on Freestyle Test Strips. She states with insurance rejecting saying too soon to fill, next refill would be 04/05/23. Pt doesn't have enough strips to last until then so she ran without insurance and for #100 cost was $131.54. she spoke with pharmacist and was advised to check to see how often pt testing and could change frequency and they should be able to override the approval since directions changed. Called and spoke with pt. Advised him of above as well. Pt states he tests 4-5x/day and it has been effective at keeping his A1c level down. Advised pt I would send in rx for Freestyle Test Strips testing 4x/day with qty #300 and see if they will approve. Advised pt to call pharmacy tomorrow and check to see if approved or not and if not to call back. Pt verbalized understanding.

## 2023-03-29 ENCOUNTER — Other Ambulatory Visit: Payer: Self-pay

## 2023-03-31 ENCOUNTER — Other Ambulatory Visit: Payer: Self-pay

## 2023-04-13 ENCOUNTER — Telehealth: Payer: Self-pay | Admitting: Family Medicine

## 2023-04-13 NOTE — Telephone Encounter (Signed)
Sent billing email to [facility] on 04/12/23 @ 11:03 AM to add an additional diagnosis code for B12 & Folic Acid lab that was done on 12/10/22 at the request of patient's wife due patient receiving a bill from [facility].  Per Dr. Carlynn Purl diagnosis code E53.8 was to be added. The [facility] billing inquiry tracking number is [id number].

## 2023-04-20 NOTE — Progress Notes (Unsigned)
Name: Ivan Booker   MRN: [id number]    DOB: Mar 14, 1966   Date:04/21/2023       Progress Note  Subjective  Chief Complaint  Annual Exam  HPI  Patient presents for annual CPE.  IPSS Questionnaire (AUA-7): Over the past month.   1)  How often have you had a sensation of not emptying your bladder completely after you finish urinating?  0 - Not at all  2)  How often have you had to urinate again less than two hours after you finished urinating? 0 - Not at all  3)  How often have you found you stopped and started again several times when you urinated?  0 - Not at all  4) How difficult have you found it to postpone urination?  0 - Not at all  5) How often have you had a weak urinary stream?  0 - Not at all  6) How often have you had to push or strain to begin urination?  0 - Not at all  7) How many times did you most typically get up to urinate from the time you went to bed until the time you got up in the morning?  1 - 1 time  Total score:  0-7 mildly symptomatic   8-19 moderately symptomatic   20-35 severely symptomatic     Diet: tries to follow a diabetic diet  Exercise: continue regular activity  Last Dental Exam: up to date  Last Eye Exam: up to date   Depression: phq 9 is negative    04/21/2023    2:10 PM 03/08/2023    3:45 PM 12/10/2022    2:44 PM 11/30/2022    2:39 PM 10/01/2022    3:05 PM  Depression screen PHQ 2/9  Decreased Interest 0 0 0 0 0  Down, Depressed, Hopeless 0 0 0 0 0  PHQ - 2 Score 0 0 0 0 0  Altered sleeping 0 0 0 0 0  Tired, decreased energy 0 0 0 0 0  Change in appetite 0 0 0 0 0  Feeling bad or failure about yourself  0 0 0 0 0  Trouble concentrating 0 0 0 0 0  Moving slowly or fidgety/restless 0 0 0 0 0  Suicidal thoughts 0 0 0 0 0  PHQ-9 Score 0 0 0 0 0    Hypertension:  BP Readings from Last 3 Encounters:  04/21/23 114/70  03/08/23 116/68  12/10/22 126/72    Obesity: Wt Readings from Last 3 Encounters:  04/21/23 241 lb (109.3 kg)   03/08/23 238 lb (108 kg)  12/10/22 237 lb 6.4 oz (107.7 kg)   BMI Readings from Last 3 Encounters:  04/21/23 30.94 kg/m  03/08/23 30.56 kg/m  12/10/22 30.48 kg/m     Lipids:  Lab Results  Component Value Date   CHOL 132 03/08/2022   CHOL 145 02/16/2021   CHOL 154 02/20/2020   Lab Results  Component Value Date   HDL 51 03/08/2022   HDL 48 02/16/2021   HDL 48 02/20/2020   Lab Results  Component Value Date   LDLCALC 65 03/08/2022   LDLCALC 74 02/16/2021   LDLCALC 83 02/20/2020   Lab Results  Component Value Date   TRIG 80 03/08/2022   TRIG 148 02/16/2021   TRIG 124 02/20/2020   Lab Results  Component Value Date   CHOLHDL 2.6 03/08/2022   CHOLHDL 3.0 02/16/2021   CHOLHDL 3.2 02/20/2020   No results found for: "LDLDIRECT"  Glucose:  Glucose, Bld  Date Value Ref Range Status  09/28/2022 300 (H) 65 - 99 mg/dL Final    Comment:    .            Fasting reference interval . For someone without known diabetes, a glucose value >125 mg/dL indicates that they may have diabetes and this should be confirmed with a follow-up test. .   03/08/2022 121 (H) 65 - 99 mg/dL Final    Comment:    .            Fasting reference interval . For someone without known diabetes, a glucose value between 100 and 125 mg/dL is consistent with prediabetes and should be confirmed with a follow-up test. .   02/16/2021 155 (H) 65 - 139 mg/dL Final    Comment:    .        Non-fasting reference interval .    Glucose-Capillary  Date Value Ref Range Status  03/02/2015 185 (H) 65 - 99 mg/dL Final    Flowsheet Row Office Visit from 04/21/2023 in [facility]  AUDIT-C Score 2      Married STD testing and prevention (HIV/chl/gon/syphilis): N/A Sexual history:  Hep C Screening: 05/09/12 Skin cancer: Discussed monitoring for atypical lesions Colorectal cancer: 05/10/14 Prostate cancer:   Lab Results  Component Value Date   PSA 0.54 03/08/2022    PSA 0.45 02/16/2021   PSA 0.4 02/20/2020     Lung cancer:  Low Dose CT Chest recommended if Age 22-80 years, 30 pack-year currently smoking OR have quit w/in 15years. Patient  not a candidate for screening   AAA: The USPSTF recommends one-time screening with ultrasonography in men ages 58 to 75 years who have ever smoked. Patient   not a candidate for screening  ECG:  03/03/15  Vaccines:    Tdap: up to date Shingrix: up to date Pneumonia: up to date Flu: up to date COVID-19: N/A  Advanced Care Planning: A voluntary discussion about advance care planning including the explanation and discussion of advance directives.  Discussed health care proxy and Living will, and the patient was able to identify a health care proxy as wife .  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) 03/07/2015   Dyslipidemia 03/07/2015   Hypogonadism male 03/07/2015   Obesity (BMI 30.0-34.9) 03/07/2015   Allergic rhinitis, seasonal 03/07/2015   Vitamin D deficiency 03/07/2015    Past Surgical History:  Procedure Laterality Date   VASECTOMY  1993    Family History  Problem Relation Age of Onset   COPD Father        Smoker    Bladder Cancer Neg Hx    Kidney cancer Neg Hx    Prostate cancer Neg Hx     Social History   Socioeconomic History   Marital status: Married    Spouse name: Zella Ball   Number of children: 5   Years of education: Not on file   Highest education level: 12th grade  Occupational History   Not on file  Tobacco Use   Smoking status: Never   Smokeless tobacco: Never  Vaping Use   Vaping status: Never Used  Substance and Sexual Activity  Alcohol use: Yes    Comment: 1-2 beers on weekends   Drug use:  No   Sexual activity: Yes    Partners: Female    Birth control/protection: None  Other Topics Concern   Not on file  Social History Narrative   Not on file   Social Determinants of Health   Financial Resource Strain: Low Risk  (03/04/2023)   Overall Financial Resource Strain (CARDIA)    Difficulty of Paying Living Expenses: Not hard at all  Food Insecurity: No Food Insecurity (03/04/2023)   Hunger Vital Sign    Worried About Running Out of Food in the Last Year: Never true    Ran Out of Food in the Last Year: Never true  Transportation Needs: No Transportation Needs (03/04/2023)   PRAPARE - Administrator, Civil Service (Medical): No    Lack of Transportation (Non-Medical): No  Physical Activity: Sufficiently Active (03/04/2023)   Exercise Vital Sign    Days of Exercise per Week: 4 days    Minutes of Exercise per Session: 60 min  Stress: No Stress Concern Present (03/04/2023)   [employer] of Occupational Health - Occupational Stress Questionnaire    Feeling of Stress : Not at all  Social Connections: Moderately Integrated (03/04/2023)   Social Connection and Isolation Panel [NHANES]    Frequency of Communication with Friends and Family: More than three times a week    Frequency of Social Gatherings with Friends and Family: Once a week    Attends Religious Services: More than 4 times per year    Active Member of [employer] or Organizations: No    Attends Banker Meetings: Not on file    Marital Status: Married  Catering manager Violence: Not At Risk (02/16/2021)   Humiliation, Afraid, Rape, and Kick questionnaire    Fear of Current or Ex-Partner: No    Emotionally Abused: No    Physically Abused: No    Sexually Abused: No     Current Outpatient Medications:    aspirin 81 MG chewable tablet, Chew 1 tablet by mouth daily., Disp: , Rfl:    atorvastatin (LIPITOR) 40 MG tablet, Take 1 tablet (40 mg total) by mouth daily., Disp: 90 tablet, Rfl: 3    Cholecalciferol (VITAMIN D) 2000 UNITS CAPS, Take 1 capsule by mouth daily., Disp: , Rfl:    Continuous Blood Gluc Sensor (DEXCOM G7 SENSOR) MISC, 1 each by Does not apply route as directed. Every 10 days, Disp: 9 each, Rfl: 1   diclofenac Sodium (VOLTAREN) 1 % GEL, Apply 4 g topically 4 (four) times daily., Disp: 100 g, Rfl: 1   Glucagon (GVOKE HYPOPEN 1-PACK) 1 MG/0.2ML SOAJ, Inject 1 mg into the skin daily as needed. May repeat in 15 minutes, Disp: 0.4 mL, Rfl: 1   glucose blood (FREESTYLE LITE) test strip, CHECK FASTING BLOOD SUGARS 4-6 TIMES DAILY, Disp: 300 strip, Rfl: 0   insulin degludec (TRESIBA FLEXTOUCH) 200 UNIT/ML FlexTouch Pen, INJECT 36 UNITS INTO THE SKIN DAILY., Disp: 9 mL, Rfl: 2   insulin lispro (HUMALOG KWIKPEN) 100 UNIT/ML KwikPen, Inject 5-16 Units into the skin 3 (three) times daily with meals., Disp: 45 mL, Rfl: 0   Insulin Pen Needle (UNIFINE PENTIPS) 31G X 8 MM MISC, USE AS DIRECTED, Disp: 100 each, Rfl: 3   Lancets (FREESTYLE) lancets, Use as instructed, Disp: 100 each, Rfl: 12   pregabalin (LYRICA) 50 MG capsule, Take 1 capsule (50 mg total) by mouth 2 (two) times daily., Disp:  180 capsule, Rfl: 0   sildenafil (VIAGRA) 100 MG tablet, Take 0.5-1 tablets (50-100 mg total) by mouth daily as needed for erectile dysfunction., Disp: 90 tablet, Rfl: 0  No Known Allergies   ROS  Constitutional: Negative for fever or weight change.  Respiratory: Negative for cough and shortness of breath.   Cardiovascular: Negative for chest pain or palpitations.  Gastrointestinal: Negative for abdominal pain, no bowel changes.  Musculoskeletal: Negative for gait problem or joint swelling.  Skin: Negative for rash.  Neurological: Negative for dizziness or headache.  No other specific complaints in a complete review of systems (except as listed in HPI above).    Objective  Vitals:   04/21/23 1403  BP: 114/70  Pulse: 65  Resp: 14  Temp: 98 F (36.7 C)  TempSrc: Oral  SpO2: 96%   Weight: 241 lb (109.3 kg)  Height: 6\' 2"  (1.88 m)    Body mass index is 30.94 kg/m.  Physical Exam  Constitutional: Patient appears well-developed and well-nourished. No distress.  HENT: Head: Normocephalic and atraumatic. Ears: B TMs ok, no erythema or effusion; Nose: Nose normal. Mouth/Throat: Oropharynx is clear and moist. No oropharyngeal exudate.  Eyes: Conjunctivae and EOM are normal. Pupils are equal, round, and reactive to light. No scleral icterus.  Neck: Normal range of motion. Neck supple. No JVD present. No thyromegaly present.  Cardiovascular: Normal rate, regular rhythm and normal heart sounds.  No murmur heard. No BLE edema. Pulmonary/Chest: Effort normal and breath sounds normal. No respiratory distress. Abdominal: Soft. Bowel sounds are normal, no distension. There is no tenderness. no masses MALE GENITALIA: Normal descended testes bilaterally, no masses palpated, no hernias, no lesions, no discharge RECTAL: Prostate normal size and consistency, no rectal masses or hemorrhoids Musculoskeletal: Normal range of motion, no joint effusions. No gross deformities Neurological: he is alert and oriented to person, place, and time. No cranial nerve deficit. Coordination, balance, strength, speech and gait are normal.  Skin: Skin is warm and dry. No rash noted. No erythema.  Psychiatric: Patient has a normal mood and affect. behavior is normal. Judgment and thought content normal.   Recent Results (from the past 2160 hour(s))  POCT HgB A1C     Status: Abnormal   Collection Time: 03/08/23  3:50 PM  Result Value Ref Range   Hemoglobin A1C 6.3 (A) 4.0 - 5.6 %   HbA1c POC (<> result, manual entry)     HbA1c, POC (prediabetic range)     HbA1c, POC (controlled diabetic range)       Fall Risk:    04/21/2023    2:10 PM 03/08/2023    3:45 PM 12/10/2022    2:44 PM 11/30/2022    2:39 PM 10/01/2022    3:05 PM  Fall Risk   Falls in the past year? 0 0 0 0 0  Number falls in past yr:   0   0  Injury with Fall?  0   0  Risk for fall due to : No Fall Risks No Fall Risks No Fall Risks No Fall Risks No Fall Risks  Follow up Falls evaluation completed;Education provided;Falls prevention discussed Falls prevention discussed Falls prevention discussed Falls prevention discussed Falls prevention discussed     Functional Status Survey: Is the patient deaf or have difficulty hearing?: No Does the patient have difficulty seeing, even when wearing glasses/contacts?: No Does the patient have difficulty concentrating, remembering, or making decisions?: No Does the patient have difficulty walking or climbing stairs?: No Does the  patient have difficulty dressing or bathing?: No Does the patient have difficulty doing errands alone such as visiting a doctor's office or shopping?: No    Assessment & Plan  1. Well adult exam  - Urine Microalbumin w/creat. ratio - Lipid panel - CBC with Differential/Platelet - COMPLETE METABOLIC PANEL WITH GFR - PSA - VITAMIN D 25 Hydroxy (Vit-D Deficiency, Fractures) - Vitamin B12 - EKG 12-Lead  2. Type 1 diabetes mellitus with other diabetic neurological complication (HCC)  - Urine Microalbumin w/creat. ratio - Lipid panel - EKG 12-Lead  3. Vitamin D deficiency  - VITAMIN D 25 Hydroxy (Vit-D Deficiency, Fractures)  4. Low serum vitamin B12  - Vitamin B12  5. Dyslipidemia  - EKG 12-Lead  6. Benign prostatic hyperplasia with urinary frequency  - PSA  7. Long-term use of high-risk medication  - CBC with Differential/Platelet - COMPLETE METABOLIC PANEL WITH GFR - Vitamin B12     -Prostate cancer screening and PSA options (with potential risks and benefits of testing vs not testing) were discussed along with recent recs/guidelines. -USPSTF grade A and B recommendations reviewed with patient; age-appropriate recommendations, preventive care, screening tests, etc discussed and encouraged; healthy living encouraged; see AVS for  patient education given to patient -Discussed importance of 150 minutes of physical activity weekly, eat two servings of fish weekly, eat one serving of tree nuts ( cashews, pistachios, pecans, almonds.Marland Kitchen) every other day, eat 6 servings of fruit/vegetables daily and drink plenty of water and avoid sweet beverages.  -Reviewed Health Maintenance: yes

## 2023-04-21 ENCOUNTER — Encounter: Payer: Self-pay | Admitting: Family Medicine

## 2023-04-21 ENCOUNTER — Ambulatory Visit (INDEPENDENT_AMBULATORY_CARE_PROVIDER_SITE_OTHER): Payer: BC Managed Care – PPO | Admitting: Family Medicine

## 2023-04-21 VITALS — BP 114/70 | HR 65 | Temp 98.0°F | Resp 14 | Ht 74.0 in | Wt 241.0 lb

## 2023-04-21 DIAGNOSIS — N401 Enlarged prostate with lower urinary tract symptoms: Secondary | ICD-10-CM

## 2023-04-21 DIAGNOSIS — E785 Hyperlipidemia, unspecified: Secondary | ICD-10-CM

## 2023-04-21 DIAGNOSIS — E538 Deficiency of other specified B group vitamins: Secondary | ICD-10-CM

## 2023-04-21 DIAGNOSIS — Z0000 Encounter for general adult medical examination without abnormal findings: Secondary | ICD-10-CM

## 2023-04-21 DIAGNOSIS — Z79899 Other long term (current) drug therapy: Secondary | ICD-10-CM | POA: Diagnosis not present

## 2023-04-21 DIAGNOSIS — E1049 Type 1 diabetes mellitus with other diabetic neurological complication: Secondary | ICD-10-CM | POA: Diagnosis not present

## 2023-04-21 DIAGNOSIS — E559 Vitamin D deficiency, unspecified: Secondary | ICD-10-CM | POA: Diagnosis not present

## 2023-04-21 DIAGNOSIS — R350 Frequency of micturition: Secondary | ICD-10-CM

## 2023-04-21 LAB — CBC WITH DIFFERENTIAL/PLATELET
Absolute Monocytes: 828 cells/uL (ref 200–950)
Basophils Absolute: 86 cells/uL (ref 0–200)
Basophils Relative: 1.2 %
Eosinophils Absolute: 281 cells/uL (ref 15–500)
Eosinophils Relative: 3.9 %
HCT: 46.5 % (ref 38.5–50.0)
Hemoglobin: 16.1 g/dL (ref 13.2–17.1)
Lymphs Abs: 2167 cells/uL (ref 850–3900)
MCH: 30.3 pg (ref 27.0–33.0)
MCHC: 34.6 g/dL (ref 32.0–36.0)
MCV: 87.6 fL (ref 80.0–100.0)
MPV: 10.1 fL (ref 7.5–12.5)
Monocytes Relative: 11.5 %
Neutro Abs: 3838 cells/uL (ref 1500–7800)
Neutrophils Relative %: 53.3 %
Platelets: 205 10*3/uL (ref 140–400)
RBC: 5.31 10*6/uL (ref 4.20–5.80)
RDW: 12.2 % (ref 11.0–15.0)
Total Lymphocyte: 30.1 %
WBC: 7.2 10*3/uL (ref 3.8–10.8)

## 2023-04-22 ENCOUNTER — Encounter: Payer: BC Managed Care – PPO | Admitting: Family Medicine

## 2023-04-22 LAB — COMPLETE METABOLIC PANEL WITH GFR: Total Protein: 6.3 g/dL (ref 6.1–8.1)

## 2023-04-26 NOTE — Telephone Encounter (Signed)
Spoke with patient and informed that the additional diagnosis code (E53.8) was submitted to [facility] on 04/13/23.  [facility] has resubmitted the claim to insurance and to allow 45 days to process.  Patient verbalized understanding and will relay the message to his wife as well.

## 2023-05-06 ENCOUNTER — Telehealth: Payer: Self-pay | Admitting: Family Medicine

## 2023-05-06 ENCOUNTER — Other Ambulatory Visit: Payer: Self-pay

## 2023-05-06 DIAGNOSIS — E1069 Type 1 diabetes mellitus with other specified complication: Secondary | ICD-10-CM

## 2023-05-06 MED ORDER — SILDENAFIL CITRATE 100 MG PO TABS
50.0000 mg | ORAL_TABLET | Freq: Every day | ORAL | 0 refills | Status: DC | PRN
Start: 2023-05-06 — End: 2024-04-23
  Filled 2023-05-06: qty 6, 30d supply, fill #0
  Filled 2023-05-12 (×2): qty 84, 84d supply, fill #1
  Filled 2023-05-12: qty 6, 30d supply, fill #1

## 2023-05-06 NOTE — Telephone Encounter (Signed)
Medication Refill - Medication: sildenafil (VIAGRA) 100 MG tablet   Wants 90 day supply   Has the patient contacted their pharmacy? Yes.   (Agent: If no, request that the patient contact the pharmacy for the refill. If patient does not wish to contact the pharmacy document the reason why and proceed with request.) (Agent: If yes, when and what did the pharmacy advise?)  Preferred Pharmacy (with phone number or street name):  [facility] [address] [place] - [address]  [address] [place] [address]  Phone: [phone] Fax: [phone]   Has the patient been seen for an appointment in the last year OR does the patient have an upcoming appointment? Yes.    Agent: Please be advised that RX refills may take up to 3 business days. We ask that you follow-up with your pharmacy.

## 2023-05-12 ENCOUNTER — Other Ambulatory Visit: Payer: Self-pay

## 2023-05-26 ENCOUNTER — Other Ambulatory Visit: Payer: Self-pay | Admitting: Family Medicine

## 2023-05-26 ENCOUNTER — Other Ambulatory Visit: Payer: Self-pay

## 2023-05-26 DIAGNOSIS — E1049 Type 1 diabetes mellitus with other diabetic neurological complication: Secondary | ICD-10-CM

## 2023-05-26 DIAGNOSIS — E1069 Type 1 diabetes mellitus with other specified complication: Secondary | ICD-10-CM

## 2023-05-26 MED ORDER — FREESTYLE LITE TEST VI STRP
ORAL_STRIP | 0 refills | Status: DC
Start: 2023-05-26 — End: 2023-07-26
  Filled 2023-05-26: qty 300, 50d supply, fill #0

## 2023-06-13 NOTE — Progress Notes (Unsigned)
Name: Ivan Booker   MRN: [id number]    DOB: 12-Jul-1966   Date:06/14/2023       Progress Note  Subjective  Chief Complaint  Follow Up  HPI  DMI with ED: he is doing well , A1C is down from 7.1 % to 6.8 % , 6.9 % , 7.2%, down to 6.3 % and today is 6.6 % , he just returned from a cruise and was not as compliant with diet.   He did not use his Dexcom during his trip.  FSBS multiple times a day and 30 days average was 147  No polyphagia, polyuria or polydipsia. ED he is back Revatio. He was diagnosed with DM as an young adult ( mid-20's ) and has good knowledge of his condition.He will order Dexcom again    BPH: used to see Urologist - Dr. Lonna Cobb - years ago, had normal biopsies in the past. He was on Cialis but stopped working, taking sildenafil with goodrx now and doing well Last PSA was normal   Paresthesia on both feet but worse on the left:  he states seems to be more aggravating after playing golf he also noticed some erythema, we gave him  Lyrica and he has been taking twice daily plus tens units  and symptoms are significantly better   Hypogonadism: He is not on testosterone supplementation anymore because insurance denied coverage.  Energy level continues to be good, he states normal libido He takes viagra prn . Unchanged  Hyperlipidemia: at goal, taking Atorvastatin every day, No complaints of cramping or chest pain. He has been taking 40 mg of atorvastatin, unable to tolerate higher dose and last LDL was at goal - 72, previously 65   Vitamin D deficiency: He is taking his vitamin D supplement daily. Last level was 53   Golfer's elbow: he is doing better, he took time off when his foot was painful and since started lyrica and took time off he is doing much better . No longer needs to wear a brace   Patient Active Problem List   Diagnosis Date Noted   Low serum vitamin B12 03/24/2023   Erectile dysfunction 06/26/2015   BPH (benign prostatic hyperplasia) 03/20/2015   Type 1  diabetes mellitus with neuropathy causing erectile dysfunction (HCC) 03/07/2015   Dyslipidemia 03/07/2015   Hypogonadism male 03/07/2015   Obesity (BMI 30.0-34.9) 03/07/2015   Allergic rhinitis, seasonal 03/07/2015   Vitamin D deficiency 03/07/2015    Past Surgical History:  Procedure Laterality Date   VASECTOMY  1993    Family History  Problem Relation Age of Onset   COPD Father        Smoker    Bladder Cancer Neg Hx    Kidney cancer Neg Hx    Prostate cancer Neg Hx     Social History   Tobacco Use   Smoking status: Never   Smokeless tobacco: Never  Substance Use Topics   Alcohol use: Yes    Comment: 1-2 beers on weekends     Current Outpatient Medications:    aspirin 81 MG chewable tablet, Chew 1 tablet by mouth daily., Disp: , Rfl:    Cholecalciferol (VITAMIN D) 2000 UNITS CAPS, Take 1 capsule by mouth daily., Disp: , Rfl:    diclofenac Sodium (VOLTAREN) 1 % GEL, Apply 4 g topically 4 (four) times daily., Disp: 100 g, Rfl: 1   Glucagon (GVOKE HYPOPEN 1-PACK) 1 MG/0.2ML SOAJ, Inject 1 mg into the skin daily as needed. May repeat in  15 minutes, Disp: 0.4 mL, Rfl: 1   glucose blood (FREESTYLE LITE) test strip, CHECK FASTING BLOOD SUGARS 4-6 TIMES DAILY, Disp: 300 strip, Rfl: 0   Insulin Pen Needle (UNIFINE PENTIPS) 31G X 8 MM MISC, USE AS DIRECTED, Disp: 100 each, Rfl: 3   Lancets (FREESTYLE) lancets, Use as instructed, Disp: 100 each, Rfl: 12   sildenafil (VIAGRA) 100 MG tablet, Take 0.5-1 tablets (50-100 mg total) by mouth daily as needed for erectile dysfunction., Disp: 90 tablet, Rfl: 0   atorvastatin (LIPITOR) 40 MG tablet, Take 1 tablet (40 mg total) by mouth daily., Disp: 90 tablet, Rfl: 3   Continuous Glucose Sensor (DEXCOM G7 SENSOR) MISC, 1 each by Does not apply route as directed. Every 10 days, Disp: 9 each, Rfl: 1   insulin degludec (TRESIBA FLEXTOUCH) 200 UNIT/ML FlexTouch Pen, INJECT 36 UNITS INTO THE SKIN DAILY., Disp: 9 mL, Rfl: 2   insulin lispro  (HUMALOG KWIKPEN) 100 UNIT/ML KwikPen, Inject 5-16 Units into the skin 3 (three) times daily with meals., Disp: 45 mL, Rfl: 1   pregabalin (LYRICA) 50 MG capsule, Take 1 capsule (50 mg total) by mouth 2 (two) times daily., Disp: 180 capsule, Rfl: 1  No Known Allergies  I personally reviewed active problem list, medication list, allergies, family history, social history, health maintenance with the patient/caregiver today.   ROS  Ten systems reviewed and is negative except as mentioned in HPI    Objective  Vitals:   06/14/23 1422  BP: 112/72  Pulse: 80  Resp: 16  Temp: 98.8 F (37.1 C)  TempSrc: Oral  SpO2: 96%  Weight: 243 lb 1.6 oz (110.3 kg)  Height: 6\' 2"  (1.88 m)    Body mass index is 31.21 kg/m.  Physical Exam  Constitutional: Patient appears well-developed and well-nourished. Obese  No distress.  HEENT: head atraumatic, normocephalic, pupils equal and reactive to light, neck supple Cardiovascular: Normal rate, regular rhythm and normal heart sounds.  No murmur heard. No BLE edema. Pulmonary/Chest: Effort normal and breath sounds normal. No respiratory distress. Abdominal: Soft.  There is no tenderness. Psychiatric: Patient has a normal mood and affect. behavior is normal. Judgment and thought content normal.    PHQ2/9:    06/14/2023    2:33 PM 04/21/2023    2:10 PM 03/08/2023    3:45 PM 12/10/2022    2:44 PM 11/30/2022    2:39 PM  Depression screen PHQ 2/9  Decreased Interest 0 0 0 0 0  Down, Depressed, Hopeless 0 0 0 0 0  PHQ - 2 Score 0 0 0 0 0  Altered sleeping 0 0 0 0 0  Tired, decreased energy 0 0 0 0 0  Change in appetite 0 0 0 0 0  Feeling bad or failure about yourself  0 0 0 0 0  Trouble concentrating 0 0 0 0 0  Moving slowly or fidgety/restless 0 0 0 0 0  Suicidal thoughts 0 0 0 0 0  PHQ-9 Score 0 0 0 0 0    phq 9 is negative   Fall Risk:    06/14/2023    2:33 PM 04/21/2023    2:10 PM 03/08/2023    3:45 PM 12/10/2022    2:44 PM 11/30/2022     2:39 PM  Fall Risk   Falls in the past year? 0 0 0 0 0  Number falls in past yr:   0    Injury with Fall?   0    Risk for  fall due to : No Fall Risks No Fall Risks No Fall Risks No Fall Risks No Fall Risks  Follow up Falls prevention discussed Falls evaluation completed;Education provided;Falls prevention discussed Falls prevention discussed Falls prevention discussed Falls prevention discussed      Assessment & Plan  1. 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 Every 10 days  Dispense: 9 each; Refill: 1 - insulin degludec (TRESIBA FLEXTOUCH) 200 UNIT/ML FlexTouch Pen; INJECT 36 UNITS INTO THE SKIN DAILY.  Dispense: 9 mL; Refill: 2 - insulin lispro (HUMALOG KWIKPEN) 100 UNIT/ML KwikPen; Inject 5-16 Units into the skin 3 (three) times daily with meals.  Dispense: 45 mL; Refill: 1 - pregabalin (LYRICA) 50 MG capsule; Take 1 capsule (50 mg total) by mouth 2 (two) times daily.  Dispense: 180 capsule; Refill: 1  2. LADA (latent autoimmune diabetes in adults), managed as type 1 (HCC)   3. Need for immunization against influenza  - Flu vaccine trivalent PF, 6mos and older(Flulaval,Afluria,Fluarix,Fluzone)  4. Dyslipidemia  - atorvastatin (LIPITOR) 40 MG tablet; Take 1 tablet (40 mg total) by mouth daily.  Dispense: 90 tablet; Refill: 3  5. Paresthesia of both feet  - pregabalin (LYRICA) 50 MG capsule; Take 1 capsule (50 mg total) by mouth 2 (two) times daily.  Dispense: 180 capsule; Refill: 1  6. Low serum vitamin B12  Continue supplementation  7. Golfer's elbow, left  Doing well   8. Vitamin D deficiency  Continue supplementation   9. Hypogonadism male

## 2023-06-14 ENCOUNTER — Other Ambulatory Visit: Payer: Self-pay

## 2023-06-14 ENCOUNTER — Encounter: Payer: Self-pay | Admitting: Family Medicine

## 2023-06-14 ENCOUNTER — Ambulatory Visit (INDEPENDENT_AMBULATORY_CARE_PROVIDER_SITE_OTHER): Payer: BC Managed Care – PPO | Admitting: Family Medicine

## 2023-06-14 VITALS — BP 112/72 | HR 80 | Temp 98.8°F | Resp 16 | Ht 74.0 in | Wt 243.1 lb

## 2023-06-14 DIAGNOSIS — E785 Hyperlipidemia, unspecified: Secondary | ICD-10-CM

## 2023-06-14 DIAGNOSIS — E291 Testicular hypofunction: Secondary | ICD-10-CM

## 2023-06-14 DIAGNOSIS — E139 Other specified diabetes mellitus without complications: Secondary | ICD-10-CM

## 2023-06-14 DIAGNOSIS — E1049 Type 1 diabetes mellitus with other diabetic neurological complication: Secondary | ICD-10-CM

## 2023-06-14 DIAGNOSIS — N521 Erectile dysfunction due to diseases classified elsewhere: Secondary | ICD-10-CM

## 2023-06-14 DIAGNOSIS — E538 Deficiency of other specified B group vitamins: Secondary | ICD-10-CM

## 2023-06-14 DIAGNOSIS — R202 Paresthesia of skin: Secondary | ICD-10-CM

## 2023-06-14 DIAGNOSIS — M7702 Medial epicondylitis, left elbow: Secondary | ICD-10-CM

## 2023-06-14 DIAGNOSIS — E559 Vitamin D deficiency, unspecified: Secondary | ICD-10-CM | POA: Diagnosis not present

## 2023-06-14 DIAGNOSIS — Z23 Encounter for immunization: Secondary | ICD-10-CM | POA: Diagnosis not present

## 2023-06-14 LAB — POCT GLYCOSYLATED HEMOGLOBIN (HGB A1C): Hemoglobin A1C: 6.6 % — AB (ref 4.0–5.6)

## 2023-06-14 MED ORDER — TRESIBA FLEXTOUCH 200 UNIT/ML ~~LOC~~ SOPN
36.0000 [IU] | PEN_INJECTOR | Freq: Every day | SUBCUTANEOUS | 2 refills | Status: DC
Start: 2023-06-14 — End: 2023-11-28
  Filled 2023-06-14 – 2023-06-22 (×2): qty 9, 50d supply, fill #0
  Filled 2023-08-14: qty 9, 50d supply, fill #1
  Filled 2023-10-04: qty 9, 50d supply, fill #2

## 2023-06-14 MED ORDER — PREGABALIN 50 MG PO CAPS
50.0000 mg | ORAL_CAPSULE | Freq: Two times a day (BID) | ORAL | 1 refills | Status: DC
  Filled 2023-06-14 – 2023-06-22 (×2): qty 180, 90d supply, fill #0
  Filled 2023-11-15: qty 180, 90d supply, fill #1

## 2023-06-14 MED ORDER — ATORVASTATIN CALCIUM 40 MG PO TABS
40.0000 mg | ORAL_TABLET | Freq: Every day | ORAL | 3 refills | Status: DC
Start: 2023-06-14 — End: 2024-06-18
  Filled 2023-06-14 – 2023-06-22 (×3): qty 90, 90d supply, fill #0
  Filled 2023-09-14: qty 90, 90d supply, fill #1
  Filled 2023-12-23: qty 90, 90d supply, fill #2
  Filled 2024-03-09: qty 90, 90d supply, fill #3

## 2023-06-14 MED ORDER — DEXCOM G7 SENSOR MISC
1.0000 | 1 refills | Status: DC
Start: 2023-06-14 — End: 2024-08-17
  Filled 2023-06-14 – 2023-06-22 (×2): qty 9, 90d supply, fill #0
  Filled 2024-04-22: qty 9, 90d supply, fill #1

## 2023-06-14 MED ORDER — INSULIN LISPRO (1 UNIT DIAL) 100 UNIT/ML (KWIKPEN)
5.0000 [IU] | PEN_INJECTOR | Freq: Three times a day (TID) | SUBCUTANEOUS | 1 refills | Status: DC
Start: 2023-06-14 — End: 2024-01-16
  Filled 2023-06-14: qty 30, 63d supply, fill #0
  Filled 2023-06-22: qty 45, 90d supply, fill #0
  Filled 2023-10-07: qty 45, 90d supply, fill #1

## 2023-06-22 ENCOUNTER — Other Ambulatory Visit: Payer: Self-pay

## 2023-06-23 ENCOUNTER — Other Ambulatory Visit: Payer: Self-pay

## 2023-07-14 ENCOUNTER — Other Ambulatory Visit (HOSPITAL_BASED_OUTPATIENT_CLINIC_OR_DEPARTMENT_OTHER): Payer: Self-pay

## 2023-07-18 ENCOUNTER — Other Ambulatory Visit: Payer: Self-pay

## 2023-07-24 ENCOUNTER — Other Ambulatory Visit: Payer: Self-pay | Admitting: Family Medicine

## 2023-07-24 DIAGNOSIS — E1069 Type 1 diabetes mellitus with other specified complication: Secondary | ICD-10-CM

## 2023-07-24 DIAGNOSIS — E1049 Type 1 diabetes mellitus with other diabetic neurological complication: Secondary | ICD-10-CM

## 2023-07-25 ENCOUNTER — Other Ambulatory Visit: Payer: Self-pay

## 2023-07-25 ENCOUNTER — Other Ambulatory Visit: Payer: Self-pay | Admitting: Family Medicine

## 2023-07-25 DIAGNOSIS — E1049 Type 1 diabetes mellitus with other diabetic neurological complication: Secondary | ICD-10-CM

## 2023-07-25 DIAGNOSIS — E1069 Type 1 diabetes mellitus with other specified complication: Secondary | ICD-10-CM

## 2023-07-26 ENCOUNTER — Other Ambulatory Visit: Payer: Self-pay

## 2023-07-26 MED FILL — Glucose Blood Test Strip: 50 days supply | Qty: 300 | Fill #0 | Status: AC

## 2023-08-15 ENCOUNTER — Other Ambulatory Visit: Payer: Self-pay

## 2023-08-17 ENCOUNTER — Other Ambulatory Visit: Payer: Self-pay

## 2023-09-14 ENCOUNTER — Other Ambulatory Visit: Payer: Self-pay

## 2023-09-14 ENCOUNTER — Other Ambulatory Visit: Payer: Self-pay | Admitting: Family Medicine

## 2023-09-14 DIAGNOSIS — E1069 Type 1 diabetes mellitus with other specified complication: Secondary | ICD-10-CM

## 2023-09-14 DIAGNOSIS — E1049 Type 1 diabetes mellitus with other diabetic neurological complication: Secondary | ICD-10-CM

## 2023-09-14 MED ORDER — FREESTYLE LITE TEST VI STRP
1.0000 | ORAL_STRIP | Freq: Every day | 0 refills | Status: DC
  Filled 2023-09-14: qty 300, 50d supply, fill #0

## 2023-09-14 NOTE — Progress Notes (Unsigned)
   There were no vitals taken for this visit.   Subjective:    Patient ID: Ivan Booker, male    DOB: 03/04/1966, 57 y.o.   MRN: [id number]  HPI: Ivan Booker is a 57 y.o. male  No chief complaint on file.   Discussed the use of AI scribe software for clinical note transcription with the patient, who gave verbal consent to proceed.  History of Present Illness           06/14/2023    2:33 PM 04/21/2023    2:10 PM 03/08/2023    3:45 PM  Depression screen PHQ 2/9  Decreased Interest 0 0 0  Down, Depressed, Hopeless 0 0 0  PHQ - 2 Score 0 0 0  Altered sleeping 0 0 0  Tired, decreased energy 0 0 0  Change in appetite 0 0 0  Feeling bad or failure about yourself  0 0 0  Trouble concentrating 0 0 0  Moving slowly or fidgety/restless 0 0 0  Suicidal thoughts 0 0 0  PHQ-9 Score 0 0 0    Relevant past medical, surgical, family and social history reviewed and updated as indicated. Interim medical history since our last visit reviewed. Allergies and medications reviewed and updated.  Review of Systems  Per HPI unless specifically indicated above     Objective:    There were no vitals taken for this visit.  {Vitals History (Optional):23777} Wt Readings from Last 3 Encounters:  06/14/23 243 lb 1.6 oz (110.3 kg)  04/21/23 241 lb (109.3 kg)  03/08/23 238 lb (108 kg)    Physical Exam  Results for orders placed or performed in visit on 06/14/23  POCT HgB A1C   Collection Time: 06/14/23  2:35 PM  Result Value Ref Range   Hemoglobin A1C 6.6 (A) 4.0 - 5.6 %   HbA1c POC (<> result, manual entry)     HbA1c, POC (prediabetic range)     HbA1c, POC (controlled diabetic range)     {Labs (Optional):23779}    Assessment & Plan:   Problem List Items Addressed This Visit   None    Assessment and Plan             Follow up plan: No follow-ups on file.

## 2023-09-15 ENCOUNTER — Ambulatory Visit (INDEPENDENT_AMBULATORY_CARE_PROVIDER_SITE_OTHER): Payer: BC Managed Care – PPO | Admitting: Nurse Practitioner

## 2023-09-15 ENCOUNTER — Encounter: Payer: Self-pay | Admitting: Nurse Practitioner

## 2023-09-15 VITALS — BP 112/70 | HR 79 | Temp 98.2°F | Resp 16 | Ht 74.0 in | Wt 242.9 lb

## 2023-09-15 DIAGNOSIS — E1049 Type 1 diabetes mellitus with other diabetic neurological complication: Secondary | ICD-10-CM

## 2023-09-15 DIAGNOSIS — E785 Hyperlipidemia, unspecified: Secondary | ICD-10-CM | POA: Diagnosis not present

## 2023-09-15 DIAGNOSIS — N401 Enlarged prostate with lower urinary tract symptoms: Secondary | ICD-10-CM

## 2023-09-15 DIAGNOSIS — R350 Frequency of micturition: Secondary | ICD-10-CM | POA: Diagnosis not present

## 2023-09-15 DIAGNOSIS — E66811 Obesity, class 1: Secondary | ICD-10-CM | POA: Diagnosis not present

## 2023-09-15 DIAGNOSIS — E291 Testicular hypofunction: Secondary | ICD-10-CM

## 2023-09-15 DIAGNOSIS — N521 Erectile dysfunction due to diseases classified elsewhere: Secondary | ICD-10-CM | POA: Diagnosis not present

## 2023-09-15 DIAGNOSIS — N528 Other male erectile dysfunction: Secondary | ICD-10-CM | POA: Diagnosis not present

## 2023-09-15 LAB — POCT GLYCOSYLATED HEMOGLOBIN (HGB A1C): Hemoglobin A1C: 7.2 % — AB (ref 4.0–5.6)

## 2023-09-15 NOTE — Assessment & Plan Note (Signed)
ED related to DM.  Increased A1C to 7.2

## 2023-09-27 ENCOUNTER — Other Ambulatory Visit: Payer: Self-pay

## 2023-10-05 ENCOUNTER — Other Ambulatory Visit: Payer: Self-pay

## 2023-10-05 ENCOUNTER — Other Ambulatory Visit: Payer: Self-pay | Admitting: Family Medicine

## 2023-10-05 DIAGNOSIS — E1049 Type 1 diabetes mellitus with other diabetic neurological complication: Secondary | ICD-10-CM

## 2023-10-05 MED ORDER — INSUPEN PEN NEEDLES 31G X 8 MM MISC
1.0000 | 3 refills | Status: DC
  Filled 2023-10-05: qty 100, 25d supply, fill #0
  Filled 2023-12-01: qty 100, 25d supply, fill #1
  Filled 2024-01-28: qty 100, 25d supply, fill #2
  Filled 2024-04-22: qty 100, 25d supply, fill #3

## 2023-11-15 ENCOUNTER — Other Ambulatory Visit: Payer: Self-pay | Admitting: Family Medicine

## 2023-11-15 DIAGNOSIS — E1049 Type 1 diabetes mellitus with other diabetic neurological complication: Secondary | ICD-10-CM

## 2023-11-15 DIAGNOSIS — E1069 Type 1 diabetes mellitus with other specified complication: Secondary | ICD-10-CM

## 2023-11-16 ENCOUNTER — Other Ambulatory Visit: Payer: Self-pay

## 2023-11-16 MED ORDER — FREESTYLE LITE TEST VI STRP
1.0000 | ORAL_STRIP | Freq: Every day | 0 refills | Status: DC
  Filled 2023-11-16: qty 300, 50d supply, fill #0

## 2023-11-21 ENCOUNTER — Ambulatory Visit: Payer: BC Managed Care – PPO | Admitting: Family Medicine

## 2023-11-21 ENCOUNTER — Encounter: Payer: Self-pay | Admitting: Family Medicine

## 2023-11-21 VITALS — BP 118/74 | HR 85 | Resp 16 | Ht 74.0 in | Wt 242.8 lb

## 2023-11-21 DIAGNOSIS — K6289 Other specified diseases of anus and rectum: Secondary | ICD-10-CM

## 2023-11-21 DIAGNOSIS — Z8719 Personal history of other diseases of the digestive system: Secondary | ICD-10-CM

## 2023-11-21 DIAGNOSIS — Z1211 Encounter for screening for malignant neoplasm of colon: Secondary | ICD-10-CM | POA: Diagnosis not present

## 2023-11-21 NOTE — Progress Notes (Signed)
 Name: Ivan Booker   MRN: [id number]    DOB: 10/19/1965   Date:11/21/2023       Progress Note  Subjective  Chief Complaint  Chief Complaint  Patient presents with   Rectal Pain    Not pain but a throbbing sensation near incision of when he had surgery long time ago. Pt states almost feels like a pulling sensation and wants PCP to give a look at it    Discussed the use of AI scribe software for clinical note transcription with the patient, who gave verbal consent to proceed.  History of Present Illness   Ivan Booker is a 58 year old male who presents with anal discomfort.  He has been experiencing intermittent anal discomfort over the past few weeks, describing it as a 'little tingle, throb' rather than pain. The sensation has subsided over the last couple of days. The discomfort began after using rough toilet paper at work, which he suspects may have caused irritation. No rectal pain, burning, or blood in the stools. No fever, chills, or significant changes in bowel habits, although there was some difficulty with bowel movements around the time the discomfort started. He manages the symptoms by soaking in hot salt water and applying Vaseline, which he finds helpful. No recent straining or constipation, and stools are smooth, resembling a banana.  He recalls a similar sensation occurring occasionally over the years since his surgery for a rectal wall tear and abscess approximately 20-25 years ago. He has a history of an anal fissure.  He has type 1 diabetes, with recent blood sugar levels averaging between 138 and 140 mg/dL over the past 7 to 14 days. He reports occasional blood sugar readings over 200 mg/dL but generally maintains good control.        Patient Active Problem List   Diagnosis Date Noted   Low serum vitamin B12 03/24/2023   Erectile dysfunction 06/26/2015   BPH (benign prostatic hyperplasia) 03/20/2015   Type 1 diabetes mellitus with neuropathy causing erectile  dysfunction (HCC) 03/07/2015   Dyslipidemia 03/07/2015   Hypogonadism male 03/07/2015   Obesity (BMI 30.0-34.9) 03/07/2015   Allergic rhinitis, seasonal 03/07/2015   Vitamin D deficiency 03/07/2015    Social History   Tobacco Use   Smoking status: Never   Smokeless tobacco: Never  Substance Use Topics   Alcohol use: Yes    Comment: 1-2 beers on weekends     Current Outpatient Medications:    aspirin 81 MG chewable tablet, Chew 1 tablet by mouth daily., Disp: , Rfl:    atorvastatin (LIPITOR) 40 MG tablet, Take 1 tablet (40 mg total) by mouth daily., Disp: 90 tablet, Rfl: 3   Cholecalciferol (VITAMIN D) 2000 UNITS CAPS, Take 1 capsule by mouth daily., Disp: , Rfl:    Continuous Glucose Sensor (DEXCOM G7 SENSOR) MISC, Use as directed to check blood sugar every 10 days., Disp: 9 each, Rfl: 1   diclofenac Sodium (VOLTAREN) 1 % GEL, Apply 4 g topically 4 (four) times daily., Disp: 100 g, Rfl: 1   Glucagon (GVOKE HYPOPEN 1-PACK) 1 MG/0.2ML SOAJ, Inject 1 mg into the skin daily as needed. May repeat in 15 minutes, Disp: 0.4 mL, Rfl: 1   glucose blood (FREESTYLE LITE) test strip, Use 1 each 4 (four) to 6 (six) times daily to check blood sugars., Disp: 300 strip, Rfl: 0   insulin degludec (TRESIBA FLEXTOUCH) 200 UNIT/ML FlexTouch Pen, Inject 36 Units into the skin daily., Disp: 9 mL, Rfl: 2  insulin lispro (HUMALOG KWIKPEN) 100 UNIT/ML KwikPen, Inject 5-16 Units into the skin 3 (three) times daily with meals., Disp: 45 mL, Rfl: 1   Insulin Pen Needle (INSUPEN PEN NEEDLES) 31G X 8 MM MISC, Use as directed to inject Tresiba/Humalog subcutaneously., Disp: 100 each, Rfl: 3   Lancets (FREESTYLE) lancets, Use as instructed, Disp: 100 each, Rfl: 12   pregabalin (LYRICA) 50 MG capsule, Take 1 capsule (50 mg total) by mouth 2 (two) times daily., Disp: 180 capsule, Rfl: 1   sildenafil (VIAGRA) 100 MG tablet, Take 0.5-1 tablets (50-100 mg total) by mouth daily as needed for erectile dysfunction., Disp:  90 tablet, Rfl: 0  No Known Allergies  ROS  Ten systems reviewed and is negative except as mentioned in HPI    Objective  Vitals:   11/21/23 1427  BP: 118/74  Pulse: 85  Resp: 16  SpO2: 98%  Weight: 242 lb 12.8 oz (110.1 kg)  Height: 6\' 2"  (1.88 m)    Body mass index is 31.17 kg/m.    Physical Exam  Constitutional: Patient appears well-developed and well-nourished.  No distress.  HEENT: head atraumatic, normocephalic, pupils equal and reactive to light, neck supple, throat within normal limits Cardiovascular: Normal rate, regular rhythm and normal heart sounds.  No murmur heard. No BLE edema. Pulmonary/Chest: Effort normal and breath sounds normal. No respiratory distress. Abdominal: Soft.  There is no tenderness. Rectal exam: some hypopigmentation on anal fold, anal area normal except for an area of scarring at 7 o'clock position. No redness or irritation observed . Prostate is smooth but enlarged Psychiatric: Patient has a normal mood and affect. behavior is normal. Judgment and thought content normal.   Recent Results (from the past 2160 hours)  POCT HgB A1C     Status: Abnormal   Collection Time: 09/15/23  2:13 PM  Result Value Ref Range   Hemoglobin A1C 7.2 (A) 4.0 - 5.6 %   HbA1c POC (<> result, manual entry)     HbA1c, POC (prediabetic range)     HbA1c, POC (controlled diabetic range)       Assessment and Plan    Anal Irritation History of anal fissure with recent intermittent discomfort, likely due to irritation from rough toilet paper. No signs of inflammation or fissure on examination. -Continue use of Vaseline for barrier protection. -Avoid rough toilet paper and consider using soft flushable wipes. -Ensure stools remain soft to avoid straining.  Colon Cancer Screening Patient is due for colonoscopy. -Schedule colonoscopy for colon cancer screening. -Show scar from previous anal fissure to GI specialist prior to  colonoscopy.  Follow-up in 3 weeks  for routine check-up.

## 2023-11-26 ENCOUNTER — Other Ambulatory Visit: Payer: Self-pay | Admitting: Family Medicine

## 2023-11-26 DIAGNOSIS — E1049 Type 1 diabetes mellitus with other diabetic neurological complication: Secondary | ICD-10-CM

## 2023-11-27 ENCOUNTER — Other Ambulatory Visit: Payer: Self-pay | Admitting: Family Medicine

## 2023-11-27 ENCOUNTER — Other Ambulatory Visit: Payer: Self-pay

## 2023-11-27 DIAGNOSIS — N521 Erectile dysfunction due to diseases classified elsewhere: Secondary | ICD-10-CM

## 2023-11-28 ENCOUNTER — Other Ambulatory Visit: Payer: Self-pay

## 2023-11-28 MED FILL — Insulin Degludec Soln Pen-Injector 200 Unit/ML: SUBCUTANEOUS | 50 days supply | Qty: 9 | Fill #0 | Status: AC

## 2023-11-29 ENCOUNTER — Telehealth: Payer: Self-pay | Admitting: *Deleted

## 2023-11-29 ENCOUNTER — Other Ambulatory Visit: Payer: Self-pay

## 2023-11-29 NOTE — Telephone Encounter (Signed)
 Patient was transferred to my phone because he did mention colonoscopy. However, after further discuss with patient and check the referral, the referral is for History of anal fissures and Colon cancer screening. Patient will need an appointment to see a provider.

## 2023-12-06 NOTE — Telephone Encounter (Signed)
 okay

## 2023-12-07 ENCOUNTER — Other Ambulatory Visit: Payer: Self-pay | Admitting: Family Medicine

## 2023-12-07 ENCOUNTER — Telehealth: Payer: Self-pay

## 2023-12-07 ENCOUNTER — Other Ambulatory Visit: Payer: Self-pay

## 2023-12-07 DIAGNOSIS — Z1211 Encounter for screening for malignant neoplasm of colon: Secondary | ICD-10-CM

## 2023-12-07 DIAGNOSIS — K6289 Other specified diseases of anus and rectum: Secondary | ICD-10-CM

## 2023-12-07 NOTE — Telephone Encounter (Signed)
 Pt requesting call back to schedule colonoscopy.

## 2023-12-07 NOTE — Telephone Encounter (Signed)
 Spoken to patient last week on 11/29/2023 and there was a telephone encounter as well.  His referral is for History of anal fissures and screening which will require an office visit.

## 2023-12-07 NOTE — Telephone Encounter (Signed)
 okay

## 2023-12-08 DIAGNOSIS — H524 Presbyopia: Secondary | ICD-10-CM | POA: Diagnosis not present

## 2023-12-08 DIAGNOSIS — H5200 Hypermetropia, unspecified eye: Secondary | ICD-10-CM | POA: Diagnosis not present

## 2023-12-08 DIAGNOSIS — H31002 Unspecified chorioretinal scars, left eye: Secondary | ICD-10-CM | POA: Diagnosis not present

## 2023-12-08 DIAGNOSIS — E119 Type 2 diabetes mellitus without complications: Secondary | ICD-10-CM | POA: Diagnosis not present

## 2023-12-08 LAB — HM DIABETES EYE EXAM

## 2023-12-12 NOTE — Addendum Note (Signed)
 Addended by: Davene Costain on: 12/12/2023 11:18 AM   Modules accepted: Orders

## 2023-12-12 NOTE — Telephone Encounter (Signed)
 Copied from CRM [id number]. Topic: Referral - Request for Referral >> Dec 12, 2023 10:09 AM Everette C wrote: Did the patient discuss referral with their provider in the last year? Yes (If No - schedule appointment) (If Yes - send message)  Appointment offered? No  Type of order/referral and detailed reason for visit: Gastroenterology / Rectal concern   Preference of office, provider, location: [facility]   If referral order, have you been seen by this specialty before? Yes (If Yes, this issue or another issue? When? Where?  Can we respond through MyChart? No

## 2023-12-13 ENCOUNTER — Other Ambulatory Visit: Payer: Self-pay | Admitting: Family Medicine

## 2023-12-13 DIAGNOSIS — K6289 Other specified diseases of anus and rectum: Secondary | ICD-10-CM

## 2023-12-13 DIAGNOSIS — Z8719 Personal history of other diseases of the digestive system: Secondary | ICD-10-CM

## 2023-12-14 ENCOUNTER — Encounter: Payer: Self-pay | Admitting: Family Medicine

## 2023-12-14 ENCOUNTER — Ambulatory Visit: Payer: BC Managed Care – PPO | Admitting: Family Medicine

## 2023-12-14 VITALS — BP 124/76 | HR 78 | Resp 16 | Ht 74.0 in | Wt 239.5 lb

## 2023-12-14 DIAGNOSIS — E1049 Type 1 diabetes mellitus with other diabetic neurological complication: Secondary | ICD-10-CM

## 2023-12-14 DIAGNOSIS — N521 Erectile dysfunction due to diseases classified elsewhere: Secondary | ICD-10-CM | POA: Diagnosis not present

## 2023-12-14 DIAGNOSIS — R350 Frequency of micturition: Secondary | ICD-10-CM

## 2023-12-14 DIAGNOSIS — E785 Hyperlipidemia, unspecified: Secondary | ICD-10-CM

## 2023-12-14 DIAGNOSIS — E538 Deficiency of other specified B group vitamins: Secondary | ICD-10-CM

## 2023-12-14 DIAGNOSIS — E559 Vitamin D deficiency, unspecified: Secondary | ICD-10-CM | POA: Diagnosis not present

## 2023-12-14 DIAGNOSIS — N401 Enlarged prostate with lower urinary tract symptoms: Secondary | ICD-10-CM

## 2023-12-14 DIAGNOSIS — E139 Other specified diabetes mellitus without complications: Secondary | ICD-10-CM

## 2023-12-14 LAB — POCT GLYCOSYLATED HEMOGLOBIN (HGB A1C): Hemoglobin A1C: 6.5 % — AB (ref 4.0–5.6)

## 2023-12-14 NOTE — Progress Notes (Signed)
 Name: Ivan Booker   MRN: [id number]    DOB: 07/06/1966   Date:12/14/2023       Progress Note  Subjective  Chief Complaint  Chief Complaint  Patient presents with   Medical Management of Chronic Issues   HPI   DMI with ED: he is doing well , A1C today is at goal at 6.5 %, he states glucose has been well controlled 7 and 14 day average 137, 30 day average was 140 .  No polyphagia, polyuria or polydipsia. ED he is back Revatio. He was diagnosed with DM as an young adult ( mid-20's ) and has good knowledge of his condition. He takes insulin pre meal and long acting   History of gastroenteritis: he had episode that lasted 2 days over 2 weeks ago , since that time he has noticed anal irritation worse when wiping, also some epigastric pain and heartburn . Advised to change to very bland diet and take Pepcid bid for about a week after that go down to once a day and see if improves, he already has scheduled visit to see GI, he was seen here for anal irritation and no lesions at the time. Anal problem present when sitting for a while.    BPH: used to see Urologist - Dr. Lonna Cobb - years ago, had normal biopsies in the past. He was on Cialis but stopped working, taking sildenafil for ED with goodrx now and doing well Last PSA was normal    Paresthesia on both feet but worse on the left:  he states seems to be more aggravating after playing golf he also noticed some erythema, we gave him  Lyrica and he has been taking twice daily plus tens units  and has works well for him   Hypogonadism: He is not on testosterone supplementation anymore because insurance denied coverage.  Energy level continues to be good, he states normal libido He takes viagra prn . Stable  Hyperlipidemia: at goal, taking Atorvastatin every day, No complaints of cramping or chest pain. He has been taking 40 mg of atorvastatin last level was a little higher at 72    Vitamin D deficiency: He is taking his vitamin D supplement daily.  Last level was 53 , continue supplements    Patient Active Problem List   Diagnosis Date Noted   Low serum vitamin B12 03/24/2023   Erectile dysfunction 06/26/2015   BPH (benign prostatic hyperplasia) 03/20/2015   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) 03/07/2015   Dyslipidemia 03/07/2015   Hypogonadism male 03/07/2015   Obesity (BMI 30.0-34.9) 03/07/2015   Allergic rhinitis, seasonal 03/07/2015   Vitamin D deficiency 03/07/2015    Past Surgical History:  Procedure Laterality Date   VASECTOMY  1993    Family History  Problem Relation Age of Onset   COPD Father        Smoker    Bladder Cancer Neg Hx    Kidney cancer Neg Hx    Prostate cancer Neg Hx     Social History   Tobacco Use   Smoking status: Never   Smokeless tobacco: Never  Substance Use Topics   Alcohol use: Yes    Comment: 1-2 beers on weekends     Current Outpatient Medications:    aspirin 81 MG chewable tablet, Chew 1 tablet by mouth daily., Disp: , Rfl:    atorvastatin (LIPITOR) 40 MG tablet, Take 1 tablet (40 mg total) by mouth daily., Disp: 90 tablet, Rfl: 3  Cholecalciferol (VITAMIN D) 2000 UNITS CAPS, Take 1 capsule by mouth daily., Disp: , Rfl:    Continuous Glucose Sensor (DEXCOM G7 SENSOR) MISC, Use as directed to check blood sugar every 10 days., Disp: 9 each, Rfl: 1   diclofenac Sodium (VOLTAREN) 1 % GEL, Apply 4 g topically 4 (four) times daily., Disp: 100 g, Rfl: 1   Glucagon (GVOKE HYPOPEN 1-PACK) 1 MG/0.2ML SOAJ, Inject 1 mg into the skin daily as needed. May repeat in 15 minutes, Disp: 0.4 mL, Rfl: 1   glucose blood (FREESTYLE LITE) test strip, Use 1 each 4 (four) to 6 (six) times daily to check blood sugars., Disp: 300 strip, Rfl: 0   insulin degludec (TRESIBA FLEXTOUCH) 200 UNIT/ML FlexTouch Pen, Inject 36 Units into the skin daily., Disp: 9 mL, Rfl: 2   insulin lispro (HUMALOG KWIKPEN) 100 UNIT/ML KwikPen, Inject 5-16 Units into the skin 3 (three)  times daily with meals., Disp: 45 mL, Rfl: 1   Insulin Pen Needle (INSUPEN PEN NEEDLES) 31G X 8 MM MISC, Use as directed to inject Tresiba/Humalog subcutaneously., Disp: 100 each, Rfl: 3   Lancets (FREESTYLE) lancets, Use as instructed, Disp: 100 each, Rfl: 12   pregabalin (LYRICA) 50 MG capsule, Take 1 capsule (50 mg total) by mouth 2 (two) times daily., Disp: 180 capsule, Rfl: 1   sildenafil (VIAGRA) 100 MG tablet, Take 0.5-1 tablets (50-100 mg total) by mouth daily as needed for erectile dysfunction., Disp: 90 tablet, Rfl: 0  No Known Allergies  I personally reviewed active problem list, medication list, allergies, family history with the patient/caregiver today.   ROS  Ten systems reviewed and is negative except as mentioned in HPI    Objective  Vitals:   12/14/23 1425  BP: 124/76  Pulse: 78  Resp: 16  SpO2: 96%  Weight: 239 lb 8 oz (108.6 kg)  Height: 6\' 2"  (1.88 m)    Body mass index is 30.75 kg/m.  Physical Exam  Constitutional: Patient appears well-developed and well-nourished. Obese  No distress.  HEENT: head atraumatic, normocephalic, pupils equal and reactive to light, neck supple Cardiovascular: Normal rate, regular rhythm and normal heart sounds.  No murmur heard. No BLE edema. Pulmonary/Chest: Effort normal and breath sounds normal. No respiratory distress. Abdominal: Soft.  There is no tenderness. Psychiatric: Patient has a normal mood and affect. behavior is normal. Judgment and thought content normal.   Recent Results (from the past 2160 hours)  POCT HgB A1C     Status: Abnormal   Collection Time: 09/15/23  2:13 PM  Result Value Ref Range   Hemoglobin A1C 7.2 (A) 4.0 - 5.6 %   HbA1c POC (<> result, manual entry)     HbA1c, POC (prediabetic range)     HbA1c, POC (controlled diabetic range)    POCT glycosylated hemoglobin (Hb A1C)     Status: Abnormal   Collection Time: 12/14/23  2:30 PM  Result Value Ref Range   Hemoglobin A1C 6.5 (A) 4.0 - 5.6 %    HbA1c POC (<> result, manual entry)     HbA1c, POC (prediabetic range)     HbA1c, POC (controlled diabetic range)      Diabetic Foot Exam:     PHQ2/9:    11/21/2023    2:17 PM 09/15/2023    2:12 PM 06/14/2023    2:33 PM 04/21/2023    2:10 PM 03/08/2023    3:45 PM  Depression screen PHQ 2/9  Decreased Interest 0 0 0 0 0  Down,  Depressed, Hopeless 0 0 0 0 0  PHQ - 2 Score 0 0 0 0 0  Altered sleeping 0  0 0 0  Tired, decreased energy 0  0 0 0  Change in appetite 0  0 0 0  Feeling bad or failure about yourself  0  0 0 0  Trouble concentrating 0  0 0 0  Moving slowly or fidgety/restless 0  0 0 0  Suicidal thoughts 0  0 0 0  PHQ-9 Score 0  0 0 0  Difficult doing work/chores Not difficult at all        phq 9 is negative  Fall Risk:    11/21/2023    2:17 PM 09/15/2023    2:12 PM 06/14/2023    2:33 PM 04/21/2023    2:10 PM 03/08/2023    3:45 PM  Fall Risk   Falls in the past year? 0 0 0 0 0  Number falls in past yr: 0    0  Injury with Fall? 0    0  Risk for fall due to : No Fall Risks No Fall Risks No Fall Risks No Fall Risks No Fall Risks  Follow up Falls prevention discussed;Education provided;Falls evaluation completed Falls prevention discussed Falls prevention discussed Falls evaluation completed;Education provided;Falls prevention discussed Falls prevention discussed     Assessment & Plan  1. 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) (Primary)  - POCT glycosylated hemoglobin (Hb A1C)  2. LADA (latent autoimmune diabetes in adults), managed as type 1 (HCC)  Doing well, continue medications  3. Dyslipidemia  On statin therapy   4. Vitamin D deficiency  Continue supplements  5. Benign prostatic hyperplasia with urinary frequency  stable  6. Low serum vitamin B12  Taking supplements

## 2023-12-20 ENCOUNTER — Other Ambulatory Visit (HOSPITAL_COMMUNITY): Payer: Self-pay

## 2023-12-29 ENCOUNTER — Ambulatory Visit: Payer: Self-pay | Admitting: *Deleted

## 2023-12-29 NOTE — Telephone Encounter (Signed)
 Chief Complaint: abdominal pain / heartburn, nausea and rectal pain / irritation Symptoms: abdominal pain comes and goes middle chest at times and nausea at times. No pain now. Reports taking antacid and helps with burning/ heartburn. Rectal pain/ irritation at times prior to defecation. Throbbing pain and after BM pain goes away. Hx anal abscess.  Frequency: several weeks ago, after getting sick possible covid Pertinent Negatives: Patient denies chest pain no difficulty breathing no fever no pain now  Disposition: [] ED /[] Urgent Care (no appt availability in office) / [x] Appointment(In office/virtual)/ []  [facility]/ [] Home Care/ [] Refused Recommended Disposition /[] [facility]/ []  Follow-up with PCP Additional Notes:   Appt scheduled for tomorrow with PCP . Recommended if sx worsen call back.       Copied from CRM [id number]. Topic: Clinical - Medical Advice >> Dec 29, 2023 11:02 AM Shon Hale wrote: Reason for CRM: Patient states he is still having heartburn and nausea. Patient having irritation around his rear. Patient states symptoms are on and off but not all the time.   Pt seeking clinical advice. Reason for Disposition  [1] MILD pain (e.g., does not interfere with normal activities) AND [2] pain comes and goes (cramps) [3] present > 48 hours  (Exception: This same abdominal pain is a chronic symptom recurrent or ongoing AND present > 4 weeks.)  Answer Assessment - Initial Assessment Questions 1. LOCATION: "Where does it hurt?"      Burn in middle of chest area and reports rectal pain /irritation 2. RADIATION: "Does the pain shoot anywhere else?" (e.g., chest, back)     na 3. ONSET: "When did the pain begin?" (Minutes, hours or days ago)      Few weeks ago  4. SUDDEN: "Gradual or sudden onset?"     na 5. PATTERN "Does the pain come and go, or is it constant?"    - If it comes and goes: "How long does it last?" "Do you have pain now?"     (Note: Comes and goes  means the pain is intermittent. It goes away completely between bouts.)    - If constant: "Is it getting better, staying the same, or getting worse?"      (Note: Constant means the pain never goes away completely; most serious pain is constant and gets worse.)      Comes and goes  6. SEVERITY: "How bad is the pain?"  (e.g., Scale 1-10; mild, moderate, or severe)    - MILD (1-3): Doesn't interfere with normal activities, abdomen soft and not tender to touch.     - MODERATE (4-7): Interferes with normal activities or awakens from sleep, abdomen tender to touch.     - SEVERE (8-10): Excruciating pain, doubled over, unable to do any normal activities.       Mild  pain low 1/10 7. RECURRENT SYMPTOM: "Have you ever had this type of stomach pain before?" If Yes, ask: "When was the last time?" and "What happened that time?"      Yes  8. CAUSE: "What do you think is causing the stomach pain?"     na 9. RELIEVING/AGGRAVATING FACTORS: "What makes it better or worse?" (e.g., antacids, bending or twisting motion, bowel movement)     Antacids  10. OTHER SYMPTOMS: "Do you have any other symptoms?" (e.g., back pain, diarrhea, fever, urination pain, vomiting)       Nausea, abdominal pain comes and goes .feels like burning /heartburn  Protocols used: Abdominal Pain - Male-A-AH

## 2023-12-30 ENCOUNTER — Ambulatory Visit (INDEPENDENT_AMBULATORY_CARE_PROVIDER_SITE_OTHER): Admitting: Family Medicine

## 2023-12-30 ENCOUNTER — Encounter: Payer: Self-pay | Admitting: Family Medicine

## 2023-12-30 ENCOUNTER — Other Ambulatory Visit (HOSPITAL_COMMUNITY): Payer: Self-pay

## 2023-12-30 ENCOUNTER — Other Ambulatory Visit: Payer: Self-pay

## 2023-12-30 VITALS — BP 124/72 | HR 89 | Resp 16 | Ht 74.0 in | Wt 240.1 lb

## 2023-12-30 DIAGNOSIS — Z8719 Personal history of other diseases of the digestive system: Secondary | ICD-10-CM

## 2023-12-30 DIAGNOSIS — K219 Gastro-esophageal reflux disease without esophagitis: Secondary | ICD-10-CM | POA: Diagnosis not present

## 2023-12-30 DIAGNOSIS — R0681 Apnea, not elsewhere classified: Secondary | ICD-10-CM

## 2023-12-30 DIAGNOSIS — K6289 Other specified diseases of anus and rectum: Secondary | ICD-10-CM

## 2023-12-30 MED ORDER — OMEPRAZOLE 20 MG PO CPDR
20.0000 mg | DELAYED_RELEASE_CAPSULE | Freq: Every day | ORAL | 1 refills | Status: DC
  Filled 2023-12-30: qty 30, 30d supply, fill #0
  Filled 2024-01-28: qty 30, 30d supply, fill #1

## 2023-12-30 NOTE — Progress Notes (Signed)
 Name: Ivan Booker   MRN: [id number]    DOB: [birth year]   Date:12/30/2023       Progress Note  Subjective  Chief Complaint  Chief Complaint  Patient presents with   Abdominal Pain    Pt had a stomach bug sometime in Feb it has been on/off    Rectal Pain    Throbbing pain from his previous incision    Discussed the use of AI scribe software for clinical note transcription with the patient, who gave verbal consent to proceed.  History of Present Illness Ivan Booker is a 58 year old male who presents with gastrointestinal symptoms following a viral illness. He is accompanied by his wife. He was referred to a gastroenterologist for further evaluation.  He has been experiencing gastrointestinal symptoms since mid-February following a viral illness, which he believes he contracted from his wife. The illness involved significant straining during episodes of vomiting, but he currently has no vomiting or diarrhea. He associates the onset of his symptoms with this stomach virus.  He reports intermittent irritation and discomfort in the area of a previous anal fissure  and surgical scar on the cranium, which sometimes coincides with nausea. The irritation initially felt like he had 'wiped too hard' or scratched something. He experiences irritation primarily when sitting for extended periods, but not while walking or sleeping. He describes the sensation as 'annoying' rather than painful. He has been using Vaseline on the irritated area, which provides some relief.  He experiences symptoms of indigestion and reflux, particularly after consuming spicy or greasy foods, such as [place] cuisine. These foods cause a 'flare up' of symptoms, leading him to avoid them. He uses Pepcid for stomach discomfort. His weight has remained stable, and he maintains a normal appetite. No significant weight loss.  He mentions that his blood sugar levels have been fluctuating, with episodes of high and low readings. He  manages these fluctuations with insulin and dietary adjustments, such as drinking grape juice when his sugar levels drop.    Patient Active Problem List   Diagnosis Date Noted   Low serum vitamin B12 03/24/2023   Erectile dysfunction 06/26/2015   BPH (benign prostatic hyperplasia) 03/20/2015   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) 03/07/2015   Dyslipidemia 03/07/2015   Hypogonadism male 03/07/2015   Obesity (BMI 30.0-34.9) 03/07/2015   Allergic rhinitis, seasonal 03/07/2015   Vitamin D deficiency 03/07/2015    Social History   Tobacco Use   Smoking status: Never   Smokeless tobacco: Never  Substance Use Topics   Alcohol use: Yes    Comment: 1-2 beers on weekends     Current Outpatient Medications:    aspirin 81 MG chewable tablet, Chew 1 tablet by mouth daily., Disp: , Rfl:    atorvastatin (LIPITOR) 40 MG tablet, Take 1 tablet (40 mg total) by mouth daily., Disp: 90 tablet, Rfl: 3   Cholecalciferol (VITAMIN D) 2000 UNITS CAPS, Take 1 capsule by mouth daily., Disp: , Rfl:    Continuous Glucose Sensor (DEXCOM G7 SENSOR) MISC, Use as directed to check blood sugar every 10 days., Disp: 9 each, Rfl: 1   diclofenac Sodium (VOLTAREN) 1 % GEL, Apply 4 g topically 4 (four) times daily., Disp: 100 g, Rfl: 1   Glucagon (GVOKE HYPOPEN 1-PACK) 1 MG/0.2ML SOAJ, Inject 1 mg into the skin daily as needed. May repeat in 15 minutes, Disp: 0.4 mL, Rfl: 1   glucose blood (FREESTYLE LITE) test strip, Use  1 each 4 (four) to 6 (six) times daily to check blood sugars., Disp: 300 strip, Rfl: 0   insulin degludec (TRESIBA FLEXTOUCH) 200 UNIT/ML FlexTouch Pen, Inject 36 Units into the skin daily., Disp: 9 mL, Rfl: 2   insulin lispro (HUMALOG KWIKPEN) 100 UNIT/ML KwikPen, Inject 5-16 Units into the skin 3 (three) times daily with meals., Disp: 45 mL, Rfl: 1   Insulin Pen Needle (INSUPEN PEN NEEDLES) 31G X 8 MM MISC, Use as directed to inject Tresiba/Humalog  subcutaneously., Disp: 100 each, Rfl: 3   Lancets (FREESTYLE) lancets, Use as instructed, Disp: 100 each, Rfl: 12   pregabalin (LYRICA) 50 MG capsule, Take 1 capsule (50 mg total) by mouth 2 (two) times daily., Disp: 180 capsule, Rfl: 1   sildenafil (VIAGRA) 100 MG tablet, Take 0.5-1 tablets (50-100 mg total) by mouth daily as needed for erectile dysfunction., Disp: 90 tablet, Rfl: 0  No Known Allergies  ROS  Ten systems reviewed and is negative except as mentioned in HPI    Objective  Vitals:   12/30/23 1320  BP: 124/72  Pulse: 89  Resp: 16  SpO2: 96%  Weight: 240 lb 1.6 oz (108.9 kg)  Height: 6\' 2"  (1.88 m)    Body mass index is 30.83 kg/m.    Physical Exam CONSTITUTIONAL: Patient appears well-developed and well-nourished. No distress. HEENT: Head atraumatic, normocephalic, neck supple. CARDIOVASCULAR: Normal rate, regular rhythm and normal heart sounds. No murmur heard. No BLE edema. PULMONARY: Effort normal and breath sounds normal. No respiratory distress. ABDOMINAL: There is no tenderness or distention. MUSCULOSKELETAL: Normal gait. Without gross motor or sensory deficit. PSYCHIATRIC: Patient has a normal mood and affect. Behavior is normal. Judgment and thought content normal. RECTAL: Rectal exam normal.  Recent Results (from the past 2160 hours)  POCT glycosylated hemoglobin (Hb A1C)     Status: Abnormal   Collection Time: 12/14/23  2:30 PM  Result Value Ref Range   Hemoglobin A1C 6.5 (A) 4.0 - 5.6 %   HbA1c POC (<> result, manual entry)     HbA1c, POC (prediabetic range)     HbA1c, POC (controlled diabetic range)      Assessment & Plan  Gastroenteritis Viral gastroenteritis in February led to ongoing GI symptoms. Likely stomach lining irritation. - Prescribed omeprazole 20 mg every morning before breakfast. Consider evening dose if symptoms persist. - Advised bland diet, avoiding spicy, greasy foods, alcohol, and carbonated drinks. - Instructed to avoid  frequent dining out and choose non-spicy, non-greasy options.  Irritable Bowel Syndrome (IBS) Suspected IBS post-gastroenteritis with abdominal irritation and altered bowel habits.  - Provided IBS educational materials. - Advised symptom and dietary trigger monitoring. - Encouraged gastroenterologist follow-up.  Anorectal irritation Irritation possibly related to previous fissure and abscess, worsened by sitting and wiping. - Recommended Vaseline or A and D ointment for irritation. - Advised continued use of topical treatments as needed.  Follow-up Consultation with gastroenterologist scheduled for June 16th. - Encouraged weekly contact with gastroenterologist's office for cancellations to expedite appointment.

## 2023-12-30 NOTE — Patient Instructions (Signed)
Irritable Bowel Syndrome, Adult  Irritable bowel syndrome (IBS) is a group of symptoms that affects the organs responsible for digestion (gastrointestinal tract, or GI tract). IBS is not one specific disease. To regulate how the GI tract works, the body sends signals back and forth between the intestines and the brain. If you have IBS, there may be a problem with these signals. As a result, the GI tract does not function normally. The intestines may become more sensitive and overreact to certain things. This may be especially true when you eat certain foods or when you are under stress. There are four main types of IBS. These may be determined based on the consistency of your stool (feces): IBS with mostly (predominance of) diarrhea. IBS with predominance of constipation. IBS with mixed bowel habits. This includes both diarrhea and constipation. IBS unclassified. This includes IBS that cannot be categorized into one of the other three main types. It is important to know which type of IBS you have. Certain treatments are more likely to be helpful for certain types of IBS. What are the causes? The exact cause of IBS is not known. What increases the risk? You may have a higher risk for IBS if you: Are male. Are younger than 40 years. Have a family history of IBS. Have a mental health condition, such as depression, anxiety, or post-traumatic stress disorder. Have had a bacterial infection of your GI tract. What are the signs or symptoms? Symptoms of IBS vary from person to person. The main symptom is abdominal pain or discomfort. Other symptoms usually include one or more of the following: Diarrhea, constipation, or both. Swelling or bloating in the abdomen. Feeling full after eating a small or regular-sized meal. Frequent gas. Mucus in the stool. A feeling of having more stool left after a bowel movement. Symptoms tend to come and go. They may be triggered by stress, mental health  conditions, or certain foods. How is this diagnosed? This condition may be diagnosed based on a physical exam, your medical history, and your symptoms. You may have tests, such as: Blood tests. Stool test. Colonoscopy. This is a procedure in which your GI tract is viewed with a long, thin, flexible tube. How is this treated? There is no cure for IBS, but treatment can help relieve symptoms. Treatment depends on the type of IBS you have, and may include: Changes to your diet, such as: Avoiding foods that cause symptoms. Drinking more water. Following a low-FODMAP (fermentable oligosaccharides, disaccharides, monosaccharides, and polyols) diet for up to 6 weeks, or as told by your health care provider. FODMAPs are sugars that are hard for some people to digest. Eating more fiber. Eating small meals at the same times every day. Medicines. These may include: Fiber supplements, if you have constipation. Medicine to control diarrhea (antidiarrheal medicines). Medicine to help control muscle tightening (spasms) in your GI tract (antispasmodic medicines). Medicines to help with mental health conditions, such as antidepressants. Talk therapy or counseling. Working with a dietitian to help create a food plan that is right for you. Managing your stress. Follow these instructions at home: Eating and drinking  Eat a healthy diet. Eat 5-6 small meals a day. Try to eat meals at about the same times each day. Do not eat large meals. Gradually eat more fiber-rich foods. These include whole grains, fruits, and vegetables. This may be especially helpful if you have IBS with constipation. Eat a diet low in FODMAPs. You may need to avoid foods such as   citrus fruits, cabbage, garlic, and onions. Drink enough fluid to keep your urine pale yellow. Keep a journal of foods that seem to trigger symptoms. Avoid foods and drinks that: Contain added sugar. Make your symptoms worse. These may include dairy  products, caffeinated drinks, and carbonated drinks. Alcohol use Do not drink alcohol if: Your health care provider tells you not to drink. You are pregnant, may be pregnant, or are planning to become pregnant. If you drink alcohol: Limit how much you have to: 0-1 drink a day for women. 0-2 drinks a day for men. Know how much alcohol is in your drink. In the [place] one drink equals one 12 oz bottle of beer (355 mL), one 5 oz glass of wine (148 mL), or one 1 oz glass of hard liquor (44 mL) General instructions Take over-the-counter and prescription medicines only as told by your health care provider. This includes supplements. Get enough exercise. Do at least 150 minutes of moderate-intensity exercise each week. Manage your stress. Getting enough sleep and exercise can help you manage stress. Keep all follow-up visits. This is important. This includes all visits with your health care provider and therapist. Where to find more information International Foundation for Functional Gastrointestinal Disorders: aboutibs.org National Institute of Diabetes and Digestive and Kidney Diseases: niddk.nih.gov Contact a health care provider if: You have constant pain. You lose weight. You have diarrhea that gets worse. You have bleeding from the rectum. You vomit often. You have a fever. Get help right away if: You have severe abdominal pain. You have diarrhea with symptoms of dehydration, such as dizziness or dry mouth. You have bloody or black stools. You have severe abdominal bloating. You have vomiting that does not stop. You have blood in your vomit. Summary Irritable bowel syndrome (IBS) is not one specific disease. It is a group of symptoms that affects digestion. Your intestines may become more sensitive and overreact to certain things. This may be especially true when you eat certain foods or when you are under stress. There is no cure for IBS, but treatment can help relieve  symptoms. This information is not intended to replace advice given to you by your health care provider. Make sure you discuss any questions you have with your health care provider. Document Revised: 08/26/2021 Document Reviewed: 08/26/2021 Elsevier Patient Education  2024 Elsevier Inc.  

## 2024-01-03 ENCOUNTER — Other Ambulatory Visit: Payer: Self-pay | Admitting: Family Medicine

## 2024-01-03 ENCOUNTER — Other Ambulatory Visit: Payer: Self-pay

## 2024-01-03 DIAGNOSIS — E1049 Type 1 diabetes mellitus with other diabetic neurological complication: Secondary | ICD-10-CM

## 2024-01-03 DIAGNOSIS — E1069 Type 1 diabetes mellitus with other specified complication: Secondary | ICD-10-CM

## 2024-01-04 ENCOUNTER — Other Ambulatory Visit: Payer: Self-pay

## 2024-01-06 ENCOUNTER — Other Ambulatory Visit: Payer: Self-pay | Admitting: Family Medicine

## 2024-01-06 ENCOUNTER — Other Ambulatory Visit: Payer: Self-pay

## 2024-01-06 DIAGNOSIS — E1049 Type 1 diabetes mellitus with other diabetic neurological complication: Secondary | ICD-10-CM

## 2024-01-06 DIAGNOSIS — E1069 Type 1 diabetes mellitus with other specified complication: Secondary | ICD-10-CM

## 2024-01-09 ENCOUNTER — Other Ambulatory Visit: Payer: Self-pay

## 2024-01-09 MED FILL — Glucose Blood Test Strip: 50 days supply | Qty: 300 | Fill #0 | Status: AC

## 2024-01-10 ENCOUNTER — Other Ambulatory Visit: Payer: Self-pay

## 2024-01-10 DIAGNOSIS — R21 Rash and other nonspecific skin eruption: Secondary | ICD-10-CM | POA: Diagnosis not present

## 2024-01-10 DIAGNOSIS — K219 Gastro-esophageal reflux disease without esophagitis: Secondary | ICD-10-CM | POA: Diagnosis not present

## 2024-01-10 DIAGNOSIS — Z1211 Encounter for screening for malignant neoplasm of colon: Secondary | ICD-10-CM | POA: Diagnosis not present

## 2024-01-10 MED ORDER — NA SULFATE-K SULFATE-MG SULF 17.5-3.13-1.6 GM/177ML PO SOLN
1.0000 | ORAL | 0 refills | Status: DC
Start: 2024-01-10 — End: 2024-03-16
  Filled 2024-01-10: qty 354, 1d supply, fill #0

## 2024-01-15 ENCOUNTER — Other Ambulatory Visit: Payer: Self-pay | Admitting: Family Medicine

## 2024-01-15 ENCOUNTER — Other Ambulatory Visit: Payer: Self-pay

## 2024-01-15 DIAGNOSIS — N521 Erectile dysfunction due to diseases classified elsewhere: Secondary | ICD-10-CM

## 2024-01-15 MED FILL — Insulin Degludec Soln Pen-Injector 200 Unit/ML: SUBCUTANEOUS | 50 days supply | Qty: 9 | Fill #1 | Status: AC

## 2024-01-16 ENCOUNTER — Other Ambulatory Visit: Payer: Self-pay

## 2024-01-16 MED FILL — Insulin Lispro Soln Pen-injector 100 Unit/ML (1 Unit Dial): SUBCUTANEOUS | 94 days supply | Qty: 45 | Fill #0 | Status: AC

## 2024-02-02 ENCOUNTER — Ambulatory Visit (INDEPENDENT_AMBULATORY_CARE_PROVIDER_SITE_OTHER): Payer: Self-pay

## 2024-02-02 DIAGNOSIS — K2960 Other gastritis without bleeding: Secondary | ICD-10-CM | POA: Diagnosis not present

## 2024-02-02 DIAGNOSIS — Z1211 Encounter for screening for malignant neoplasm of colon: Secondary | ICD-10-CM

## 2024-02-02 DIAGNOSIS — K2970 Gastritis, unspecified, without bleeding: Secondary | ICD-10-CM | POA: Diagnosis not present

## 2024-02-02 DIAGNOSIS — K640 First degree hemorrhoids: Secondary | ICD-10-CM

## 2024-02-02 DIAGNOSIS — K5730 Diverticulosis of large intestine without perforation or abscess without bleeding: Secondary | ICD-10-CM

## 2024-02-26 ENCOUNTER — Other Ambulatory Visit: Payer: Self-pay | Admitting: Family Medicine

## 2024-02-26 DIAGNOSIS — K219 Gastro-esophageal reflux disease without esophagitis: Secondary | ICD-10-CM

## 2024-02-27 ENCOUNTER — Other Ambulatory Visit: Payer: Self-pay

## 2024-02-27 MED ORDER — OMEPRAZOLE 20 MG PO CPDR
20.0000 mg | DELAYED_RELEASE_CAPSULE | Freq: Every day | ORAL | 1 refills | Status: DC
  Filled 2024-02-27: qty 30, 30d supply, fill #0
  Filled 2024-03-28: qty 30, 30d supply, fill #1

## 2024-02-28 ENCOUNTER — Encounter: Payer: Self-pay | Admitting: Family Medicine

## 2024-02-29 ENCOUNTER — Other Ambulatory Visit: Payer: Self-pay

## 2024-02-29 MED ORDER — BLOOD GLUCOSE TEST VI STRP
ORAL_STRIP | 12 refills | Status: DC
  Filled 2024-02-29: qty 100, 30d supply, fill #0
  Filled 2024-03-09: qty 100, 30d supply, fill #1

## 2024-02-29 MED ORDER — ONETOUCH ULTRA 2 W/DEVICE KIT
PACK | Freq: Three times a day (TID) | 0 refills | Status: AC
  Filled 2024-02-29: qty 1, 1d supply, fill #0

## 2024-03-09 ENCOUNTER — Other Ambulatory Visit (HOSPITAL_COMMUNITY): Payer: Self-pay

## 2024-03-09 ENCOUNTER — Other Ambulatory Visit: Payer: Self-pay

## 2024-03-09 MED FILL — Insulin Degludec Soln Pen-Injector 200 Unit/ML: SUBCUTANEOUS | 50 days supply | Qty: 9 | Fill #2 | Status: AC

## 2024-03-12 ENCOUNTER — Other Ambulatory Visit: Payer: Self-pay

## 2024-03-16 ENCOUNTER — Other Ambulatory Visit: Payer: Self-pay

## 2024-03-16 ENCOUNTER — Ambulatory Visit: Admitting: Family Medicine

## 2024-03-16 ENCOUNTER — Encounter: Payer: Self-pay | Admitting: Family Medicine

## 2024-03-16 VITALS — BP 114/72 | HR 80 | Resp 16 | Ht 74.0 in | Wt 239.1 lb

## 2024-03-16 DIAGNOSIS — E1042 Type 1 diabetes mellitus with diabetic polyneuropathy: Secondary | ICD-10-CM

## 2024-03-16 DIAGNOSIS — R0681 Apnea, not elsewhere classified: Secondary | ICD-10-CM

## 2024-03-16 DIAGNOSIS — R0982 Postnasal drip: Secondary | ICD-10-CM | POA: Diagnosis not present

## 2024-03-16 DIAGNOSIS — E7800 Pure hypercholesterolemia, unspecified: Secondary | ICD-10-CM

## 2024-03-16 DIAGNOSIS — E538 Deficiency of other specified B group vitamins: Secondary | ICD-10-CM

## 2024-03-16 DIAGNOSIS — K219 Gastro-esophageal reflux disease without esophagitis: Secondary | ICD-10-CM | POA: Diagnosis not present

## 2024-03-16 DIAGNOSIS — E559 Vitamin D deficiency, unspecified: Secondary | ICD-10-CM | POA: Diagnosis not present

## 2024-03-16 DIAGNOSIS — E139 Other specified diabetes mellitus without complications: Secondary | ICD-10-CM

## 2024-03-16 DIAGNOSIS — Z794 Long term (current) use of insulin: Secondary | ICD-10-CM

## 2024-03-16 LAB — POCT GLYCOSYLATED HEMOGLOBIN (HGB A1C): Hemoglobin A1C: 6.5 % — AB (ref 4.0–5.6)

## 2024-03-16 MED ORDER — LEVOCETIRIZINE DIHYDROCHLORIDE 5 MG PO TABS
5.0000 mg | ORAL_TABLET | Freq: Every evening | ORAL | 1 refills | Status: DC
  Filled 2024-03-16: qty 30, 30d supply, fill #0

## 2024-03-16 MED ORDER — FLUTICASONE PROPIONATE 50 MCG/ACT NA SUSP
2.0000 | Freq: Every day | NASAL | 1 refills | Status: DC
  Filled 2024-03-16 (×2): qty 16, 30d supply, fill #0
  Filled 2024-04-22: qty 16, 30d supply, fill #1

## 2024-03-16 NOTE — Progress Notes (Signed)
 Name: Ivan Booker   MRN: [id number]    DOB: [birth year]   Date:03/16/2024       Progress Note  Subjective  Chief Complaint  Chief Complaint  Patient presents with   Medical Management of Chronic Issues   Discussed the use of AI scribe software for clinical note transcription with the patient, who gave verbal consent to proceed.  History of Present Illness Ivan Booker is a 58 year old male with type 1 diabetes and neuropathy who presents with hoarseness and allergy symptoms.  He has been experiencing hoarseness and sneezing since mowing the lawn last week. His voice has remained hoarse, though it improved after taking Benadryl last night. He does not take regular allergy medication but notes that allergies have been more bothersome in recent years. No shortness of breath, wheezing, itchy eyes, nose, or throat. He feels better than earlier in the week.  He has type 1 diabetes and recently started using a new glucose monitoring system, OneTouch, due to insurance changes. His blood sugar levels average around 160 mg/dL, with occasional spikes to 240 mg/dL. He continues to use Humalog  before meals and Tresiba  36 units in the morning. He also uses a Dexcom for monitoring but spaces out its use. His blood sugar can spike after meals, such as after eating peanut butter crackers.  He experiences neuropathy in his feet, managed with pregabalin  and a TENS unit, both of which he finds helpful. His feet used to burn at the end of the day but have improved.  He has a history of hemorrhoids, which have subsided. His stomach issues have calmed down after a recent procedure. He was previously on omeprazole  for gastritis.  He takes B12 and vitamin D  supplements daily. He also takes atorvastatin  for cholesterol management, which he reports is effective.    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) 03/07/2015   Dyslipidemia 03/07/2015   Hypogonadism male 03/07/2015   Obesity (BMI 30.0-34.9) 03/07/2015   Allergic rhinitis, seasonal 03/07/2015   Vitamin D  deficiency 03/07/2015    Past Surgical History:  Procedure Laterality Date   VASECTOMY  1993    Family History  Problem Relation Age of Onset   COPD Father        Smoker    Bladder Cancer Neg Hx    Kidney cancer Neg Hx    Prostate cancer Neg Hx     Social History   Tobacco Use   Smoking status: Never   Smokeless tobacco: Never  Substance Use Topics   Alcohol use: Yes    Comment: 1-2 beers on weekends     Current Outpatient Medications:    aspirin 81 MG chewable tablet, Chew 1 tablet by mouth daily., Disp: , Rfl:    atorvastatin  (LIPITOR) 40 MG tablet, Take 1 tablet (40 mg total) by mouth daily., Disp: 90 tablet, Rfl: 3   Blood Glucose Monitoring Suppl (ONE TOUCH ULTRA 2) w/Device KIT, Check blood glucose 3 (three) times daily., Disp: 1 kit, Rfl: 0   Cholecalciferol (VITAMIN D ) 2000 UNITS CAPS, Take 1 capsule by mouth daily., Disp: , Rfl:    Continuous Glucose Sensor (DEXCOM G7 SENSOR) MISC, Use as directed to check blood sugar every 10 days., Disp: 9 each, Rfl: 1  fluticasone  (FLONASE ) 50 MCG/ACT nasal spray, Place 2 sprays into both nostrils daily., Disp: 16 g, Rfl: 1   Glucagon  (GVOKE HYPOPEN  1-PACK) 1 MG/0.2ML SOAJ, Inject 1 mg into the skin daily as needed. May repeat in 15 minutes, Disp: 0.4 mL, Rfl: 1   Glucose Blood (BLOOD GLUCOSE TEST STRIPS) STRP, Use as instructed 3 times a day, Disp: 100 each, Rfl: 12   insulin  degludec (TRESIBA  FLEXTOUCH) 200 UNIT/ML FlexTouch Pen, Inject 36 Units into the skin daily., Disp: 9 mL, Rfl: 2   insulin  lispro (HUMALOG ) 100 UNIT/ML KwikPen, Inject 5-16 Units into the skin 3 (three) times daily with meals., Disp: 45 mL, Rfl: 1    Insulin  Pen Needle (INSUPEN PEN NEEDLES) 31G X 8 MM MISC, Use as directed to inject Tresiba /Humalog  subcutaneously., Disp: 100 each, Rfl: 3   Lancets (FREESTYLE) lancets, Use as instructed, Disp: 100 each, Rfl: 12   levocetirizine (XYZAL) 5 MG tablet, Take 1 tablet (5 mg total) by mouth every evening., Disp: 30 tablet, Rfl: 1   omeprazole  (PRILOSEC) 20 MG capsule, Take 1 capsule (20 mg total) by mouth daily., Disp: 30 capsule, Rfl: 1   pregabalin  (LYRICA ) 50 MG capsule, Take 1 capsule (50 mg total) by mouth 2 (two) times daily., Disp: 180 capsule, Rfl: 1   sildenafil  (VIAGRA ) 100 MG tablet, Take 0.5-1 tablets (50-100 mg total) by mouth daily as needed for erectile dysfunction., Disp: 90 tablet, Rfl: 0  No Known Allergies  I personally reviewed active problem list, medication list, allergies, family history with the patient/caregiver today.   ROS  Ten systems reviewed and is negative except as mentioned in HPI    Objective Physical Exam CONSTITUTIONAL: Patient appears well-developed and well-nourished. No distress. HEENT: Head atraumatic, normocephalic, neck supple. CARDIOVASCULAR: Normal rate, regular rhythm and normal heart sounds. No murmur heard. No BLE edema, no clubbing, no cyanosis. PULMONARY: Effort normal and breath sounds normal. Lungs clear to auscultation, no wheezing, no bronchi. No respiratory distress. MUSCULOSKELETAL: Normal gait. Without gross motor or sensory deficit. PSYCHIATRIC: Patient has a normal mood and affect. Behavior is normal. Judgment and thought content normal.  Vitals:   03/16/24 1426  BP: 114/72  Pulse: 80  Resp: 16  SpO2: 97%  Weight: 239 lb 1.6 oz (108.5 kg)  Height: 6' 2 (1.88 m)    Body mass index is 30.7 kg/m.  Recent Results (from the past 2160 hours)  POCT glycosylated hemoglobin (Hb A1C)     Status: Abnormal   Collection Time: 03/16/24  2:32 PM  Result Value Ref Range   Hemoglobin A1C 6.5 (A) 4.0 - 5.6 %   HbA1c POC (<> result,  manual entry)     HbA1c, POC (prediabetic range)     HbA1c, POC (controlled diabetic range)      Diabetic Foot Exam:  Diabetic foot exam was performed with the following findings:   No data filed      PHQ2/9:    03/16/2024    2:22 PM 11/21/2023    2:17 PM 09/15/2023    2:12 PM 06/14/2023    2:33 PM 04/21/2023    2:10 PM  Depression screen PHQ 2/9  Decreased Interest 0 0 0 0 0  Down, Depressed, Hopeless 0 0 0 0 0  PHQ - 2 Score 0 0 0 0 0  Altered sleeping 0 0  0 0  Tired, decreased energy 0 0  0 0  Change in appetite 0 0  0 0  Feeling bad or failure about yourself  0 0  0 0  Trouble concentrating 0 0  0 0  Moving slowly or fidgety/restless 0 0  0 0  Suicidal thoughts 0 0  0 0  PHQ-9 Score 0 0  0 0  Difficult doing work/chores Not difficult at all Not difficult at all       phq 9 is negative  Fall Risk:    03/16/2024    2:22 PM 11/21/2023    2:17 PM 09/15/2023    2:12 PM 06/14/2023    2:33 PM 04/21/2023    2:10 PM  Fall Risk   Falls in the past year? 0 0 0 0 0  Number falls in past yr: 0 0     Injury with Fall? 0 0     Risk for fall due to : No Fall Risks No Fall Risks No Fall Risks No Fall Risks No Fall Risks  Follow up Falls prevention discussed;Education provided;Falls evaluation completed Falls prevention discussed;Education provided;Falls evaluation completed Falls prevention discussed Falls prevention discussed Falls evaluation completed;Education provided;Falls prevention discussed      Assessment & Plan Type 1 Diabetes Mellitus/LADA treated as type 1 associated hypercholesterolemia  and neuropathy Well-controlled with excellent A1c. Postprandial spikes noted. Prefers current regimen with Humalog  and Tresiba . Intermittent Dexcom use. - Continue Humalog  before meals using sliding scale. - Continue Tresiba  36 units in the morning. - Encourage consistent use of Dexcom for blood glucose monitoring. - Emphasize preprandial insulin  administration.  Diabetic  Neuropathy Effectively managed with pregabalin  and TENS unit. - Continue TENS unit.  Post nasal drainage Nasal drainage and hoarseness likely due to allergies. Prefers Xyzal if covered by insurance. - Prescribe Flonase  nasal spray. - Prescribe Xyzal if covered; consider Claritin  or Zyrtec otherwise. - Discuss saline spray and OTC antihistamines.  Gastritis/GERD Previously treated with omeprazole . Plan to taper to prevent rebound. - Taper omeprazole  to every other day, then every two days before discontinuation. - Use OTC Pepcid if symptoms recur. - Monitor for symptoms and contact provider if symptoms return during tapering.  Hemorrhoids Previously exacerbated by gastroenteritis. - Use corticosteroid suppositories if symptomatic.

## 2024-03-22 ENCOUNTER — Other Ambulatory Visit: Payer: Self-pay

## 2024-03-22 ENCOUNTER — Telehealth: Payer: Self-pay | Admitting: Family Medicine

## 2024-03-22 MED ORDER — BLOOD GLUCOSE TEST VI STRP
ORAL_STRIP | 12 refills | Status: DC
Start: 2024-03-22 — End: 2024-05-23
  Filled 2024-03-22: qty 100, 33d supply, fill #0
  Filled 2024-03-28: qty 100, 30d supply, fill #0
  Filled 2024-04-22: qty 100, 30d supply, fill #1
  Filled 2024-05-14 – 2024-05-16 (×3): qty 100, 30d supply, fill #2
  Filled 2024-05-22: qty 100, 33d supply, fill #2

## 2024-03-22 NOTE — Telephone Encounter (Unsigned)
 Copied from CRM [id number]. Topic: Clinical - Medication Refill >> Mar 22, 2024  8:15 AM Berwyn MATSU wrote: Medication: ONE TOUCH ULTRA 2 test strips; patient is requesting 90 day supply  3-4 times a day he is testing.   Has the patient contacted their pharmacy? Yes (Agent: If no, request that the patient contact the pharmacy for the refill. If patient does not wish to contact the pharmacy document the reason why and proceed with request.) (Agent: If yes, when and what did the pharmacy advise?)  This is the patient's preferred pharmacy:  [facility] [address] [place] [address] Phone: [phone] Fax: [phone]  Is this the correct pharmacy for this prescription? Yes If no, delete pharmacy and type the correct one.   Has the prescription been filled recently? Yes  Is the patient out of the medication? Yes  Has the patient been seen for an appointment in the last year OR does the patient have an upcoming appointment? Yes  Can we respond through MyChart? Yes  Agent: Please be advised that Rx refills may take up to 3 business days. We ask that you follow-up with your pharmacy.

## 2024-03-26 ENCOUNTER — Other Ambulatory Visit: Payer: Self-pay | Admitting: Family Medicine

## 2024-03-26 ENCOUNTER — Other Ambulatory Visit: Payer: Self-pay

## 2024-03-28 ENCOUNTER — Other Ambulatory Visit: Payer: Self-pay | Admitting: Family Medicine

## 2024-03-28 ENCOUNTER — Other Ambulatory Visit: Payer: Self-pay

## 2024-03-28 DIAGNOSIS — E1049 Type 1 diabetes mellitus with other diabetic neurological complication: Secondary | ICD-10-CM

## 2024-03-28 DIAGNOSIS — R202 Paresthesia of skin: Secondary | ICD-10-CM

## 2024-03-28 MED ORDER — PREGABALIN 50 MG PO CAPS
50.0000 mg | ORAL_CAPSULE | Freq: Two times a day (BID) | ORAL | 1 refills | Status: AC
Start: 2024-03-28 — End: ?
  Filled 2024-03-28: qty 180, 90d supply, fill #0

## 2024-04-03 DIAGNOSIS — R0683 Snoring: Secondary | ICD-10-CM | POA: Diagnosis not present

## 2024-04-03 DIAGNOSIS — F458 Other somatoform disorders: Secondary | ICD-10-CM | POA: Diagnosis not present

## 2024-04-03 DIAGNOSIS — G4710 Hypersomnia, unspecified: Secondary | ICD-10-CM | POA: Diagnosis not present

## 2024-04-03 DIAGNOSIS — J3089 Other allergic rhinitis: Secondary | ICD-10-CM | POA: Diagnosis not present

## 2024-04-08 DIAGNOSIS — G4733 Obstructive sleep apnea (adult) (pediatric): Secondary | ICD-10-CM | POA: Diagnosis not present

## 2024-04-22 ENCOUNTER — Other Ambulatory Visit: Payer: Self-pay | Admitting: Family Medicine

## 2024-04-22 DIAGNOSIS — E1069 Type 1 diabetes mellitus with other specified complication: Secondary | ICD-10-CM

## 2024-04-22 DIAGNOSIS — E1049 Type 1 diabetes mellitus with other diabetic neurological complication: Secondary | ICD-10-CM

## 2024-04-22 MED FILL — Insulin Lispro Soln Pen-injector 100 Unit/ML (1 Unit Dial): SUBCUTANEOUS | 94 days supply | Qty: 45 | Fill #1 | Status: AC

## 2024-04-23 ENCOUNTER — Other Ambulatory Visit: Payer: Self-pay

## 2024-04-23 ENCOUNTER — Other Ambulatory Visit: Payer: Self-pay | Admitting: Family Medicine

## 2024-04-23 DIAGNOSIS — N521 Erectile dysfunction due to diseases classified elsewhere: Secondary | ICD-10-CM

## 2024-04-23 DIAGNOSIS — E1069 Type 1 diabetes mellitus with other specified complication: Secondary | ICD-10-CM

## 2024-04-23 MED FILL — Sildenafil Citrate Tab 100 MG: ORAL | 90 days supply | Qty: 90 | Fill #0 | Status: AC

## 2024-04-25 ENCOUNTER — Encounter: Payer: BC Managed Care – PPO | Admitting: Family Medicine

## 2024-04-25 ENCOUNTER — Ambulatory Visit (INDEPENDENT_AMBULATORY_CARE_PROVIDER_SITE_OTHER): Payer: BC Managed Care – PPO | Admitting: Family Medicine

## 2024-04-25 ENCOUNTER — Other Ambulatory Visit: Payer: Self-pay

## 2024-04-25 ENCOUNTER — Encounter: Payer: Self-pay | Admitting: Family Medicine

## 2024-04-25 VITALS — BP 118/74 | HR 71 | Resp 16 | Ht 73.0 in | Wt 242.5 lb

## 2024-04-25 DIAGNOSIS — Z79899 Other long term (current) drug therapy: Secondary | ICD-10-CM | POA: Diagnosis not present

## 2024-04-25 DIAGNOSIS — N401 Enlarged prostate with lower urinary tract symptoms: Secondary | ICD-10-CM | POA: Diagnosis not present

## 2024-04-25 DIAGNOSIS — Z125 Encounter for screening for malignant neoplasm of prostate: Secondary | ICD-10-CM | POA: Diagnosis not present

## 2024-04-25 DIAGNOSIS — R350 Frequency of micturition: Secondary | ICD-10-CM

## 2024-04-25 DIAGNOSIS — E139 Other specified diabetes mellitus without complications: Secondary | ICD-10-CM | POA: Diagnosis not present

## 2024-04-25 DIAGNOSIS — Z0000 Encounter for general adult medical examination without abnormal findings: Secondary | ICD-10-CM

## 2024-04-25 NOTE — Progress Notes (Addendum)
 Name: Ivan Booker   MRN: [id number]    DOB: [birth year]   Date:04/25/2024       Progress Note  Subjective  Chief Complaint  Chief Complaint  Patient presents with   Annual Exam    HPI  Patient presents for annual CPE .   IPSS     Row Name 04/25/24 1353         International Prostate Symptom Score   How often have you had the sensation of not emptying your bladder? Not at All     How often have you had to urinate less than every two hours? Not at All     How often have you found you stopped and started again several times when you urinated? Less than 1 in 5 times     How often have you found it difficult to postpone urination? Not at All     How often have you had a weak urinary stream? Not at All     How often have you had to strain to start urination? Not at All     How many times did you typically get up at night to urinate? 1 Time     Total IPSS Score 2       Quality of Life due to urinary symptoms   If you were to spend the rest of your life with your urinary condition just the way it is now how would you feel about that? Pleased        Diet: avoiding sodas, eats balanced meals Exercise: active in his job, also mowing yards, not golfing lately due to the heat  Last Dental Exam: up to date Last Eye Exam: up to date   Depression: phq 9 is negative    04/25/2024    1:52 PM 03/16/2024    2:22 PM 11/21/2023    2:17 PM 09/15/2023    2:12 PM 06/14/2023    2:33 PM  Depression screen PHQ 2/9  Decreased Interest 0 0 0 0 0  Down, Depressed, Hopeless 0 0 0 0 0  PHQ - 2 Score 0 0 0 0 0  Altered sleeping 0 0 0  0  Tired, decreased energy 0 0 0  0  Change in appetite 0 0 0  0  Feeling bad or failure about yourself  0 0 0  0  Trouble concentrating 0 0 0  0  Moving slowly or fidgety/restless 0 0 0  0  Suicidal thoughts 0 0 0  0  PHQ-9 Score 0 0 0  0  Difficult doing work/chores Not difficult at all Not difficult at all Not difficult at all      Hypertension:  BP  Readings from Last 3 Encounters:  04/25/24 118/74  03/16/24 114/72  12/30/23 124/72    Obesity: Wt Readings from Last 3 Encounters:  04/25/24 242 lb 8 oz (110 kg)  03/16/24 239 lb 1.6 oz (108.5 kg)  12/30/23 240 lb 1.6 oz (108.9 kg)   BMI Readings from Last 3 Encounters:  04/25/24 31.99 kg/m  03/16/24 30.70 kg/m  12/30/23 30.83 kg/m     [employer] Visit from 04/25/2024 [facility]  AUDIT-C Score 0     Married STD testing and prevention (HIV/chl/gon/syphilis):  not applicable Sexual history: takes viagra , normal libido  Hep C Screening: completed Skin cancer: Discussed monitoring for atypical lesions Colorectal cancer: repeat in 2025 Prostate cancer:  yes Lab Results  Component Value Date  PSA 0.57 04/21/2023   PSA 0.54 03/08/2022   PSA 0.45 02/16/2021     Lung cancer:  Low Dose CT Chest recommended if Age 95-80 years, 30 pack-year currently smoking OR have quit w/in 15years. Patient  is not a candidate for screening   AAA: The USPSTF recommends one-time screening with ultrasonography in men ages 59 to 75 years who have ever smoked. Patient   is not a candidate for screening  ECG:  04/21/2023  Vaccines: reviewed with the patient.   Advanced Care Planning: A voluntary discussion about advance care planning including the explanation and discussion of advance directives.  Discussed health care proxy and Living will, and the patient was able to identify a health care proxy as wife.  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) 03/07/2015   Dyslipidemia 03/07/2015   Hypogonadism male 03/07/2015   Obesity  (BMI 30.0-34.9) 03/07/2015   Allergic rhinitis, seasonal 03/07/2015   Vitamin D  deficiency 03/07/2015    Past Surgical History:  Procedure Laterality Date   VASECTOMY  1993    Family History  Problem Relation Age of Onset   COPD Father        Smoker    Bladder Cancer Neg Hx    Kidney cancer Neg Hx    Prostate cancer Neg Hx     Social History   Socioeconomic History   Marital status: Married    Spouse name: Grayce   Number of children: 5   Years of education: Not on file   Highest education level: 12th grade  Occupational History   Not on file  Tobacco Use   Smoking status: Never   Smokeless tobacco: Never  Vaping Use   Vaping status: Never Used  Substance and Sexual Activity   Alcohol use: Not Currently    Comment: 1-2 beers on weekends   Drug use: No   Sexual activity: Yes    Partners: Female    Birth control/protection: None  Other Topics Concern   Not on file  Social History Narrative   Not on file   Social Drivers of Health   Financial Resource Strain: Low Risk  (03/12/2024)   Overall Financial Resource Strain (CARDIA)    Difficulty of Paying Living Expenses: Not hard at all  Food Insecurity: No Food Insecurity (03/12/2024)   Hunger Vital Sign    Worried About Running Out of Food in the Last Year: Never true    Ran Out of Food in the Last Year: Never true  Transportation Needs: No Transportation Needs (03/12/2024)   PRAPARE - Administrator, Civil Service (Medical): No    Lack of Transportation (Non-Medical): No  Physical Activity: Sufficiently Active (03/12/2024)   Exercise Vital Sign    Days of Exercise per Week: 3 days    Minutes of Exercise per Session: 60 min  Stress: No Stress Concern Present (03/12/2024)   [employer] of Occupational Health - Occupational Stress Questionnaire    Feeling of Stress: Not at all  Social Connections: Socially Integrated (03/12/2024)   Social Connection and Isolation Panel    Frequency of  Communication with Friends and Family: More than three times a week  Frequency of Social Gatherings with Friends and Family: Three times a week    Attends Religious Services: More than 4 times per year    Active Member of Clubs or Organizations: Yes    Attends Banker Meetings: More than 4 times per year    Marital Status: Married  Catering manager Violence: Not At Risk (04/25/2024)   Humiliation, Afraid, Rape, and Kick questionnaire    Fear of Current or Ex-Partner: No    Emotionally Abused: No    Physically Abused: No    Sexually Abused: No     Current Outpatient Medications:    aspirin 81 MG chewable tablet, Chew 1 tablet by mouth daily., Disp: , Rfl:    atorvastatin  (LIPITOR) 40 MG tablet, Take 1 tablet (40 mg total) by mouth daily., Disp: 90 tablet, Rfl: 3   Blood Glucose Monitoring Suppl (ONE TOUCH ULTRA 2) w/Device KIT, Check blood glucose 3 (three) times daily., Disp: 1 kit, Rfl: 0   Cholecalciferol (VITAMIN D ) 2000 UNITS CAPS, Take 1 capsule by mouth daily., Disp: , Rfl:    Continuous Glucose Sensor (DEXCOM G7 SENSOR) MISC, Use as directed to check blood sugar every 10 days., Disp: 9 each, Rfl: 1   fluticasone  (FLONASE ) 50 MCG/ACT nasal spray, Place 2 sprays into both nostrils daily., Disp: 16 g, Rfl: 1   Glucagon  (GVOKE HYPOPEN  1-PACK) 1 MG/0.2ML SOAJ, Inject 1 mg into the skin daily as needed. May repeat in 15 minutes, Disp: 0.4 mL, Rfl: 1   Glucose Blood (BLOOD GLUCOSE TEST STRIPS) STRP, Use as instructed 3 times a day, Disp: 100 each, Rfl: 12   insulin  degludec (TRESIBA  FLEXTOUCH) 200 UNIT/ML FlexTouch Pen, Inject 36 Units into the skin daily., Disp: 9 mL, Rfl: 2   insulin  lispro (HUMALOG ) 100 UNIT/ML KwikPen, Inject 5-16 Units into the skin 3 (three) times daily with meals., Disp: 45 mL, Rfl: 1   Insulin  Pen Needle (INSUPEN PEN NEEDLES) 31G X 8 MM MISC, Use as directed to inject Tresiba /Humalog  subcutaneously., Disp: 100 each, Rfl: 3   Lancets (FREESTYLE)  lancets, Use as instructed, Disp: 100 each, Rfl: 12   levocetirizine (XYZAL ) 5 MG tablet, Take 1 tablet (5 mg total) by mouth every evening., Disp: 30 tablet, Rfl: 1   omeprazole  (PRILOSEC) 20 MG capsule, Take 1 capsule (20 mg total) by mouth daily., Disp: 30 capsule, Rfl: 1   pregabalin  (LYRICA ) 50 MG capsule, Take 1 capsule (50 mg total) by mouth 2 (two) times daily., Disp: 180 capsule, Rfl: 1   sildenafil  (VIAGRA ) 100 MG tablet, Take 0.5-1 tablets (50-100 mg total) by mouth daily as needed for erectile dysfunction., Disp: 90 tablet, Rfl: 0  No Known Allergies   ROS  Constitutional: Negative for fever or weight change.  Respiratory: Negative for cough and shortness of breath.   Cardiovascular: Negative for chest pain or palpitations.  Gastrointestinal: Negative for abdominal pain, no bowel changes.  Musculoskeletal: Negative for gait problem or joint swelling.  Skin: Negative for rash.  Neurological: Negative for dizziness or headache.  No other specific complaints in a complete review of systems (except as listed in HPI above).    Objective  Vitals:   04/25/24 1351  BP: 118/74  Pulse: 71  Resp: 16  SpO2: 96%  Weight: 242 lb 8 oz (110 kg)  Height: 6' 1 (1.854 m)    Body mass index is 31.99 kg/m.  Physical Exam  Constitutional: Patient appears well-developed and well-nourished. No distress.  HENT: Head: Normocephalic and atraumatic.  Ears: B TMs ok, no erythema or effusion; Nose: Nose normal. Mouth/Throat: Oropharynx is clear and moist. No oropharyngeal exudate.  Eyes: Conjunctivae and EOM are normal. Pupils are equal, round, and reactive to light. No scleral icterus.  Neck: Normal range of motion. Neck supple. No JVD present. No thyromegaly present.  Cardiovascular: Normal rate, regular rhythm and normal heart sounds.  No murmur heard. No BLE edema. Pulmonary/Chest: Effort normal and breath sounds normal. No respiratory distress. Abdominal: Soft. Bowel sounds are normal,  no distension. There is no tenderness. no masses MALE GENITALIA: Normal descended testes bilaterally, no masses palpated, no hernias, no lesions, no discharge RECTAL: Prostate slightly enlarged but no nodules,  no rectal masses  Musculoskeletal: Normal range of motion, no joint effusions. No gross deformities Neurological: he is alert and oriented to person, place, and time. No cranial nerve deficit. Coordination, balance, strength, speech and gait are normal.  Skin: Skin is warm and dry. No rash noted. No erythema.  Psychiatric: Patient has a normal mood and affect. behavior is normal. Judgment and thought content normal.     Assessment & Plan  1. Well adult exam (Primary)  - Lipid panel - Microalbumin / creatinine urine ratio - CBC with Differential/Platelet - Comprehensive metabolic panel with GFR - PSA  2. LADA (latent autoimmune diabetes in adults), managed as type 1 (HCC)  - Microalbumin / creatinine urine ratio  3. Benign prostatic hyperplasia with urinary frequency  - PSA  4. Prostate cancer screening  - PSA  5. Long-term use of high-risk medication  - CBC with Differential/Platelet - Comprehensive metabolic panel with GFR    -Prostate cancer screening and PSA options (with potential risks and benefits of testing vs not testing) were discussed along with recent recs/guidelines. -USPSTF grade A and B recommendations reviewed with patient; age-appropriate recommendations, preventive care, screening tests, etc discussed and encouraged; healthy living encouraged; see AVS for patient education given to patient -Discussed importance of 150 minutes of physical activity weekly, eat two servings of fish weekly, eat one serving of tree nuts ( cashews, pistachios, pecans, almonds.SABRA) every other day, eat 6 servings of fruit/vegetables daily and drink plenty of water and avoid sweet beverages.  -Reviewed Health Maintenance: yes

## 2024-04-25 NOTE — Patient Instructions (Signed)

## 2024-04-26 ENCOUNTER — Ambulatory Visit: Payer: Self-pay | Admitting: Family Medicine

## 2024-04-26 LAB — CBC WITH DIFFERENTIAL/PLATELET
Absolute Lymphocytes: 2487 {cells}/uL (ref 850–3900)
Absolute Monocytes: 851 {cells}/uL (ref 200–950)
Basophils Absolute: 97 {cells}/uL (ref 0–200)
Basophils Relative: 1.2 %
Eosinophils Absolute: 292 {cells}/uL (ref 15–500)
Eosinophils Relative: 3.6 %
HCT: 49.1 % (ref 38.5–50.0)
Hemoglobin: 16.2 g/dL (ref 13.2–17.1)
MCH: 29.5 pg (ref 27.0–33.0)
MCHC: 33 g/dL (ref 32.0–36.0)
MCV: 89.4 fL (ref 80.0–100.0)
MPV: 10.2 fL (ref 7.5–12.5)
Monocytes Relative: 10.5 %
Neutro Abs: 4374 {cells}/uL (ref 1500–7800)
Neutrophils Relative %: 54 %
Platelets: 225 Thousand/uL (ref 140–400)
RBC: 5.49 Million/uL (ref 4.20–5.80)
RDW: 12.3 % (ref 11.0–15.0)
Total Lymphocyte: 30.7 %
WBC: 8.1 Thousand/uL (ref 3.8–10.8)

## 2024-04-26 LAB — COMPREHENSIVE METABOLIC PANEL WITH GFR
AG Ratio: 1.9 (calc) (ref 1.0–2.5)
ALT: 26 U/L (ref 9–46)
AST: 20 U/L (ref 10–35)
Albumin: 4.2 g/dL (ref 3.6–5.1)
Alkaline phosphatase (APISO): 103 U/L (ref 35–144)
BUN: 20 mg/dL (ref 7–25)
CO2: 28 mmol/L (ref 20–32)
Calcium: 9.2 mg/dL (ref 8.6–10.3)
Chloride: 103 mmol/L (ref 98–110)
Creat: 0.96 mg/dL (ref 0.70–1.35)
Globulin: 2.2 g/dL (ref 1.9–3.7)
Glucose, Bld: 117 mg/dL — ABNORMAL HIGH (ref 65–99)
Potassium: 4.2 mmol/L (ref 3.5–5.3)
Sodium: 138 mmol/L (ref 135–146)
Total Bilirubin: 0.9 mg/dL (ref 0.2–1.2)
Total Protein: 6.4 g/dL (ref 6.1–8.1)
eGFR: 90 mL/min/1.73m2 (ref >=60)

## 2024-04-26 LAB — LIPID PANEL
Cholesterol: 148 mg/dL (ref <200)
HDL: 49 mg/dL (ref >=40)
LDL Cholesterol (Calc): 79 mg/dL
Non-HDL Cholesterol (Calc): 99 mg/dL (ref <130)
Total CHOL/HDL Ratio: 3 (calc) (ref <5.0)
Triglycerides: 112 mg/dL (ref <150)

## 2024-04-26 LAB — MICROALBUMIN / CREATININE URINE RATIO
Creatinine, Urine: 197 mg/dL (ref 20–320)
Microalb, Ur: <0.2 mg/dL

## 2024-04-26 LAB — PSA: PSA: 0.68 ng/mL (ref <=4.00)

## 2024-04-30 DIAGNOSIS — R0683 Snoring: Secondary | ICD-10-CM | POA: Diagnosis not present

## 2024-04-30 DIAGNOSIS — G4733 Obstructive sleep apnea (adult) (pediatric): Secondary | ICD-10-CM | POA: Diagnosis not present

## 2024-04-30 DIAGNOSIS — G4710 Hypersomnia, unspecified: Secondary | ICD-10-CM | POA: Diagnosis not present

## 2024-04-30 DIAGNOSIS — E66811 Obesity, class 1: Secondary | ICD-10-CM | POA: Diagnosis not present

## 2024-05-14 ENCOUNTER — Other Ambulatory Visit: Payer: Self-pay

## 2024-05-14 ENCOUNTER — Other Ambulatory Visit: Payer: Self-pay | Admitting: Family Medicine

## 2024-05-14 DIAGNOSIS — R0681 Apnea, not elsewhere classified: Secondary | ICD-10-CM

## 2024-05-14 MED ORDER — OMEPRAZOLE 20 MG PO CPDR
20.0000 mg | DELAYED_RELEASE_CAPSULE | Freq: Every day | ORAL | 1 refills | Status: DC
  Filled 2024-05-14: qty 90, 90d supply, fill #0
  Filled 2024-07-30: qty 90, 90d supply, fill #1

## 2024-05-14 MED ORDER — OMEPRAZOLE 20 MG PO CPDR
20.0000 mg | DELAYED_RELEASE_CAPSULE | Freq: Every day | ORAL | 1 refills | Status: DC
  Filled 2024-05-14: qty 30, 30d supply, fill #0

## 2024-05-14 NOTE — Addendum Note (Signed)
 Addended by: GLENARD MIRE F on: 05/14/2024 10:32 AM   Modules accepted: Orders

## 2024-05-16 ENCOUNTER — Telehealth: Payer: Self-pay

## 2024-05-16 ENCOUNTER — Other Ambulatory Visit: Payer: Self-pay

## 2024-05-16 NOTE — Telephone Encounter (Signed)
 Copied from CRM [id number]. Topic: Clinical - Prescription Issue >> May 16, 2024  8:22 AM Berneda FALCON wrote: Reason for CRM: Pt states he needs a new prescription for the One Touch Test strips, he checks it 4-5 times per day so he is running out before they are due for a refill.  [facility] [address] [place] [address] Phone: [phone] Fax: [phone] Hours: M-F 7:30a-7:00p

## 2024-05-22 ENCOUNTER — Other Ambulatory Visit: Payer: Self-pay

## 2024-05-22 ENCOUNTER — Encounter: Payer: Self-pay | Admitting: Family Medicine

## 2024-05-23 ENCOUNTER — Other Ambulatory Visit: Payer: Self-pay

## 2024-05-23 ENCOUNTER — Other Ambulatory Visit: Payer: Self-pay | Admitting: Family Medicine

## 2024-05-23 MED ORDER — BLOOD GLUCOSE TEST VI STRP
1.0000 | ORAL_STRIP | Freq: Every day | 1 refills | Status: AC
  Filled 2024-05-23 – 2024-06-17 (×2): qty 450, 90d supply, fill #0
  Filled 2024-09-23: qty 450, 90d supply, fill #1

## 2024-06-12 ENCOUNTER — Other Ambulatory Visit: Payer: Self-pay

## 2024-06-17 ENCOUNTER — Other Ambulatory Visit: Payer: Self-pay | Admitting: Family Medicine

## 2024-06-17 ENCOUNTER — Other Ambulatory Visit: Payer: Self-pay

## 2024-06-17 DIAGNOSIS — R0982 Postnasal drip: Secondary | ICD-10-CM

## 2024-06-17 DIAGNOSIS — E785 Hyperlipidemia, unspecified: Secondary | ICD-10-CM

## 2024-06-17 DIAGNOSIS — E1049 Type 1 diabetes mellitus with other diabetic neurological complication: Secondary | ICD-10-CM

## 2024-06-18 ENCOUNTER — Encounter: Payer: Self-pay | Admitting: Family Medicine

## 2024-06-18 ENCOUNTER — Other Ambulatory Visit: Payer: Self-pay

## 2024-06-18 ENCOUNTER — Ambulatory Visit: Admitting: Family Medicine

## 2024-06-18 VITALS — BP 104/70 | HR 68 | Resp 16 | Ht 73.0 in | Wt 244.3 lb

## 2024-06-18 DIAGNOSIS — N401 Enlarged prostate with lower urinary tract symptoms: Secondary | ICD-10-CM

## 2024-06-18 DIAGNOSIS — R0681 Apnea, not elsewhere classified: Secondary | ICD-10-CM

## 2024-06-18 DIAGNOSIS — E538 Deficiency of other specified B group vitamins: Secondary | ICD-10-CM | POA: Diagnosis not present

## 2024-06-18 DIAGNOSIS — E1042 Type 1 diabetes mellitus with diabetic polyneuropathy: Secondary | ICD-10-CM

## 2024-06-18 DIAGNOSIS — K219 Gastro-esophageal reflux disease without esophagitis: Secondary | ICD-10-CM

## 2024-06-18 DIAGNOSIS — E559 Vitamin D deficiency, unspecified: Secondary | ICD-10-CM | POA: Diagnosis not present

## 2024-06-18 DIAGNOSIS — E291 Testicular hypofunction: Secondary | ICD-10-CM

## 2024-06-18 DIAGNOSIS — E139 Other specified diabetes mellitus without complications: Secondary | ICD-10-CM

## 2024-06-18 DIAGNOSIS — E785 Hyperlipidemia, unspecified: Secondary | ICD-10-CM

## 2024-06-18 DIAGNOSIS — R350 Frequency of micturition: Secondary | ICD-10-CM

## 2024-06-18 LAB — TESTOSTERONE TOTAL,FREE,BIO, MALES
Albumin: 4.2 g/dL (ref 3.6–5.1)
Sex Hormone Binding: 47 nmol/L (ref 22–77)
Testosterone, Bioavailable: 97 ng/dL — ABNORMAL LOW (ref 110.0–575.0)
Testosterone, Free: 50.4 pg/mL (ref 46.0–224.0)
Testosterone: 500 ng/dL (ref 250–827)

## 2024-06-18 LAB — POCT GLYCOSYLATED HEMOGLOBIN (HGB A1C): Hemoglobin A1C: 6.9 % — AB (ref 4.0–5.6)

## 2024-06-18 MED ORDER — ATORVASTATIN CALCIUM 40 MG PO TABS
40.0000 mg | ORAL_TABLET | Freq: Every day | ORAL | 3 refills | Status: AC
  Filled 2024-06-18: qty 90, 90d supply, fill #0
  Filled 2024-09-23: qty 90, 90d supply, fill #1

## 2024-06-18 NOTE — Progress Notes (Signed)
 Name: Ivan Booker   MRN: [id number]    DOB: 1965/12/21   Date:06/18/2024       Progress Note  Subjective  Chief Complaint  Chief Complaint  Patient presents with   Medical Management of Chronic Issues   Discussed the use of AI scribe software for clinical note transcription with the patient, who gave verbal consent to proceed.  History of Present Illness Ivan Booker is a 58 year old male with type 1 diabetes who presents for a follow-up visit.  He has latent autoimmune diabetes in adults (LADA) with a recent A1c of 6.9, which is slightly higher than previous levels. He experiences hypoglycemic episodes with blood sugar levels dropping to the 40s or 50s, which he can sense and manage. He uses a Dexcom sensor for continuous glucose monitoring and recently switched from the Dexcom 6 to the Dexcom 7, noting issues with sensor accuracy during the first 24 hours of use.   Date of Download: 06/18/2024 % Time CGM is active: 150% Average Glucose: 6.9 mg/dL Glucose Management Indicator: Dexcom  Glucose Variability: 35.3  (goal <36%) Time in Goal:  - Time in range 70-180: 68% - Time above range: 27% - Time below range: 5%   He has complications from diabetes including dyslipidemia, erectile dysfunction, and peripheral neuropathy. For erectile dysfunction, he uses Viagra  but finds it not always effective and experiences stress related to this issue. He also mentions a past low testosterone  level but has not been taking supplementation. For peripheral neuropathy, he takes pregabalin , one to two tablets daily, and uses a TENS unit, which he finds helpful.  He has a history of gastroesophageal reflux disease (GERD) and takes omeprazole  for management. He also has benign prostatic hyperplasia (BPH) with urinary frequency.  His current medications include atorvastatin  for dyslipidemia, Tresiba  for diabetes management, and fluticasone  nasal spray for allergies. He recently acquired a supply of  Tresiba  from a friend's family and has refilled his atorvastatin  and nasal spray prescriptions. He does not wish to receive the flu or pneumonia vaccines at this time.    Patient Active Problem List   Diagnosis Date Noted   GERD with apnea without esophagitis 03/16/2024   Low serum vitamin B12 03/24/2023   Erectile dysfunction 06/26/2015   BPH (benign prostatic hyperplasia) 03/20/2015   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) 03/07/2015   Dyslipidemia 03/07/2015   Hypogonadism male 03/07/2015   Obesity (BMI 30.0-34.9) 03/07/2015   Allergic rhinitis, seasonal 03/07/2015   Vitamin D  deficiency 03/07/2015    Past Surgical History:  Procedure Laterality Date   VASECTOMY  1993    Family History  Problem Relation Age of Onset   COPD Father        Smoker    Bladder Cancer Neg Hx    Kidney cancer Neg Hx    Prostate cancer Neg Hx     Social History   Tobacco Use   Smoking status: Never   Smokeless tobacco: Never  Substance Use Topics   Alcohol use: Not Currently    Comment: 1-2 beers on weekends     Current Outpatient Medications:    aspirin 81 MG chewable tablet, Chew 1 tablet by mouth daily., Disp: , Rfl:    atorvastatin  (LIPITOR) 40 MG tablet, Take 1 tablet (40 mg total) by mouth daily., Disp: 90 tablet, Rfl: 3   Blood Glucose Monitoring Suppl (ONE TOUCH ULTRA 2) w/Device KIT, Check blood glucose 3 (three) times daily., Disp: 1 kit, Rfl:  0   Cholecalciferol (VITAMIN D ) 2000 UNITS CAPS, Take 1 capsule by mouth daily., Disp: , Rfl:    Continuous Glucose Sensor (DEXCOM G7 SENSOR) MISC, Use as directed to check blood sugar every 10 days., Disp: 9 each, Rfl: 1   fluticasone  (FLONASE ) 50 MCG/ACT nasal spray, Place 2 sprays into both nostrils daily., Disp: 16 g, Rfl: 1   Glucagon  (GVOKE HYPOPEN  1-PACK) 1 MG/0.2ML SOAJ, Inject 1 mg into the skin daily as needed. May repeat in 15 minutes, Disp: 0.4 mL, Rfl: 1   Glucose Blood (BLOOD GLUCOSE TEST  STRIPS) STRP, Use to check blood sugar five times daily., Disp: 450 each, Rfl: 1   insulin  degludec (TRESIBA  FLEXTOUCH) 200 UNIT/ML FlexTouch Pen, Inject 36 Units into the skin daily., Disp: 9 mL, Rfl: 2   insulin  lispro (HUMALOG ) 100 UNIT/ML KwikPen, Inject 5-16 Units into the skin 3 (three) times daily with meals., Disp: 45 mL, Rfl: 1   Insulin  Pen Needle (INSUPEN PEN NEEDLES) 31G X 8 MM MISC, Use as directed to inject Tresiba /Humalog  subcutaneously., Disp: 100 each, Rfl: 3   Lancets (FREESTYLE) lancets, Use as instructed, Disp: 100 each, Rfl: 12   omeprazole  (PRILOSEC) 20 MG capsule, Take 1 capsule (20 mg total) by mouth daily., Disp: 90 capsule, Rfl: 1   pregabalin  (LYRICA ) 50 MG capsule, Take 1 capsule (50 mg total) by mouth 2 (two) times daily., Disp: 180 capsule, Rfl: 1   sildenafil  (VIAGRA ) 100 MG tablet, Take 0.5-1 tablets (50-100 mg total) by mouth daily as needed for erectile dysfunction., Disp: 90 tablet, Rfl: 0   levocetirizine (XYZAL ) 5 MG tablet, Take 1 tablet (5 mg total) by mouth every evening., Disp: 30 tablet, Rfl: 1  No Known Allergies  I personally reviewed active problem list, medication list, allergies with the patient/caregiver today.   ROS  Ten systems reviewed and is negative except as mentioned in HPI    Objective Physical Exam CONSTITUTIONAL: Patient appears well-developed and well-nourished.  No distress. HEENT: Head atraumatic, normocephalic, neck supple. CARDIOVASCULAR: Normal rate, regular rhythm and normal heart sounds.  No murmur heard. No BLE edema. PULMONARY: Effort normal and breath sounds normal. No respiratory distress. ABDOMINAL: There is no tenderness or distention. MUSCULOSKELETAL: Normal gait. Without gross motor or sensory deficit. PSYCHIATRIC: Patient has a normal mood and affect. behavior is normal. Judgment and thought content normal.  Vitals:   06/18/24 1414  BP: 104/70  Pulse: 68  Resp: 16  SpO2: 98%  Weight: 244 lb 4.8 oz (110.8 kg)   Height: 6' 1 (1.854 m)    Body mass index is 32.23 kg/m.  Recent Results (from the past 2160 hours)  Lipid panel     Status: None   Collection Time: 04/25/24  2:29 PM  Result Value Ref Range   Cholesterol 148 <200 mg/dL   HDL 49 > OR = 40 mg/dL   Triglycerides 887 <849 mg/dL   LDL Cholesterol (Calc) 79 mg/dL (calc)    Comment: Reference range: <100 . Desirable range <100 mg/dL for primary prevention;   <70 mg/dL for patients with CHD or diabetic patients  with > or = 2 CHD risk factors. SABRA LDL-C is now calculated using the Martin-Hopkins  calculation, which is a validated novel method providing  better accuracy than the Friedewald equation in the  estimation of LDL-C.  Gladis APPLETHWAITE et al. SANDREA. 7986;689(80): 2061-2068  (http://education.[facility].com/faq/FAQ164)    Total CHOL/HDL Ratio 3.0 <5.0 (calc)   Non-HDL Cholesterol (Calc) 99 <869 mg/dL (calc)  Comment: For patients with diabetes plus 1 major ASCVD risk  factor, treating to a non-HDL-C goal of <100 mg/dL  (LDL-C of <29 mg/dL) is considered a therapeutic  option.   Microalbumin / creatinine urine ratio     Status: None   Collection Time: 04/25/24  2:29 PM  Result Value Ref Range   Creatinine, Urine 197 20 - 320 mg/dL   Microalb, Ur <9.7 mg/dL    Comment: Reference Range Not established    Microalb Creat Ratio NOTE <30 mg/g creat    Comment: NOTE: The urine albumin value is less than  0.2 mg/dL therefore we are unable to calculate  excretion and/or creatinine ratio. . The ADA defines abnormalities in albumin excretion as follows: SABRA Albuminuria Category        Result (mg/g creatinine) . Normal to Mildly increased   <30 Moderately increased         30-299  Severely increased           > OR = 300 . The ADA recommends that at least two of three specimens collected within a 3-6 month period be abnormal before considering a patient to be within a diagnostic category.   CBC with Differential/Platelet      Status: None   Collection Time: 04/25/24  2:29 PM  Result Value Ref Range   WBC 8.1 3.8 - 10.8 Thousand/uL   RBC 5.49 4.20 - 5.80 Million/uL   Hemoglobin 16.2 13.2 - 17.1 g/dL   HCT 50.8 61.4 - 49.9 %   MCV 89.4 80.0 - 100.0 fL   MCH 29.5 27.0 - 33.0 pg   MCHC 33.0 32.0 - 36.0 g/dL    Comment: For adults, a slight decrease in the calculated MCHC value (in the range of 30 to 32 g/dL) is most likely not clinically significant; however, it should be interpreted with caution in correlation with other red cell parameters and the patient's clinical condition.    RDW 12.3 11.0 - 15.0 %   Platelets 225 140 - 400 Thousand/uL   MPV 10.2 7.5 - 12.5 fL   Neutro Abs 4,374 1,500 - 7,800 cells/uL   Absolute Lymphocytes 2,487 850 - 3,900 cells/uL   Absolute Monocytes 851 200 - 950 cells/uL   Eosinophils Absolute 292 15 - 500 cells/uL   Basophils Absolute 97 0 - 200 cells/uL   Neutrophils Relative % 54 %   Total Lymphocyte 30.7 %   Monocytes Relative 10.5 %   Eosinophils Relative 3.6 %   Basophils Relative 1.2 %  Comprehensive metabolic panel with GFR     Status: Abnormal   Collection Time: 04/25/24  2:29 PM  Result Value Ref Range   Glucose, Bld 117 (H) 65 - 99 mg/dL    Comment: .            Fasting reference interval . For someone without known diabetes, a glucose value between 100 and 125 mg/dL is consistent with prediabetes and should be confirmed with a follow-up test. .    BUN 20 7 - 25 mg/dL   Creat 9.03 9.29 - 8.64 mg/dL   eGFR 90 > OR = 60 fO/fpw/8.26f7   BUN/Creatinine Ratio SEE NOTE: 6 - 22 (calc)    Comment:    Not Reported: BUN and Creatinine are within    reference range. .    Sodium 138 135 - 146 mmol/L   Potassium 4.2 3.5 - 5.3 mmol/L   Chloride 103 98 - 110 mmol/L   CO2 28 20 -  32 mmol/L   Calcium  9.2 8.6 - 10.3 mg/dL   Total Protein 6.4 6.1 - 8.1 g/dL   Albumin 4.2 3.6 - 5.1 g/dL   Globulin 2.2 1.9 - 3.7 g/dL (calc)   AG Ratio 1.9 1.0 - 2.5 (calc)    Total Bilirubin 0.9 0.2 - 1.2 mg/dL   Alkaline phosphatase (APISO) 103 35 - 144 U/L   AST 20 10 - 35 U/L   ALT 26 9 - 46 U/L  PSA     Status: None   Collection Time: 04/25/24  2:29 PM  Result Value Ref Range   PSA 0.68 < OR = 4.00 ng/mL    Comment: The total PSA value from this assay system is  standardized against the WHO standard. The test  result will be approximately 20% lower when compared  to the equimolar-standardized total PSA (Beckman  Coulter). Comparison of serial PSA results should be  interpreted with this fact in mind. . This test was performed using the Siemens  chemiluminescent method. Values obtained from  different assay methods cannot be used interchangeably. PSA levels, regardless of value, should not be interpreted as absolute evidence of the presence or absence of disease.   POCT glycosylated hemoglobin (Hb A1C)     Status: Abnormal   Collection Time: 06/18/24  2:20 PM  Result Value Ref Range   Hemoglobin A1C 6.9 (A) 4.0 - 5.6 %   HbA1c POC (<> result, manual entry)     HbA1c, POC (prediabetic range)     HbA1c, POC (controlled diabetic range)      PHQ2/9:    06/18/2024    2:05 PM 04/25/2024    1:52 PM 03/16/2024    2:22 PM 11/21/2023    2:17 PM 09/15/2023    2:12 PM  Depression screen PHQ 2/9  Decreased Interest 0 0 0 0 0  Down, Depressed, Hopeless 0 0 0 0 0  PHQ - 2 Score 0 0 0 0 0  Altered sleeping 0 0 0 0   Tired, decreased energy 0 0 0 0   Change in appetite 0 0 0 0   Feeling bad or failure about yourself  0 0 0 0   Trouble concentrating 0 0 0 0   Moving slowly or fidgety/restless 0 0 0 0   Suicidal thoughts 0 0 0 0   PHQ-9 Score 0 0 0 0   Difficult doing work/chores Not difficult at all Not difficult at all Not difficult at all Not difficult at all     phq 9 is negative  Fall Risk:    06/18/2024    2:05 PM 04/25/2024    1:51 PM 03/16/2024    2:22 PM 11/21/2023    2:17 PM 09/15/2023    2:12 PM  Fall Risk   Falls in the past year? 0 0 0  0 0  Number falls in past yr: 0 0 0 0   Injury with Fall? 0 0 0 0   Risk for fall due to : No Fall Risks No Fall Risks No Fall Risks No Fall Risks No Fall Risks  Follow up Falls evaluation completed Falls evaluation completed Falls prevention discussed;Education provided;Falls evaluation completed Falls prevention discussed;Education provided;Falls evaluation completed Falls prevention discussed    Assessment & Plan Type 1 diabetes mellitus with peripheral neuropathy, dyslipidemia, and erectile dysfunction Type 1 diabetes managed with insulin . A1c at 6.9. Hypoglycemic episodes noted. Peripheral neuropathy managed with pregabalin  and TENS. Dyslipidemia managed with atorvastatin . Erectile dysfunction possibly related  to neuropathy and age. Past low testosterone  levels considered. - Refill atorvastatin  prescription. - Check testosterone  levels. - Discuss urologist referral for erectile dysfunction if needed. - Advise insulin  administration 20 minutes before meals.  Benign prostatic hyperplasia with lower urinary tract symptoms Benign prostatic hyperplasia with urinary frequency.  Gastroesophageal reflux disease  Vitamin B12 deficiency Vitamin B12 deficiency noted.  Vitamin D  deficiency Vitamin D  deficiency noted.

## 2024-06-19 ENCOUNTER — Other Ambulatory Visit: Payer: Self-pay

## 2024-06-19 ENCOUNTER — Ambulatory Visit: Payer: Self-pay | Admitting: Family Medicine

## 2024-06-20 ENCOUNTER — Other Ambulatory Visit: Payer: Self-pay | Admitting: Family Medicine

## 2024-06-20 ENCOUNTER — Other Ambulatory Visit: Payer: Self-pay

## 2024-06-20 DIAGNOSIS — E1049 Type 1 diabetes mellitus with other diabetic neurological complication: Secondary | ICD-10-CM

## 2024-06-20 DIAGNOSIS — R0982 Postnasal drip: Secondary | ICD-10-CM

## 2024-06-20 MED ORDER — INSUPEN PEN NEEDLES 31G X 8 MM MISC
1.0000 | 3 refills | Status: AC
  Filled 2024-06-20: qty 100, 25d supply, fill #0
  Filled 2024-08-23: qty 100, 25d supply, fill #1
  Filled 2024-10-29: qty 100, 25d supply, fill #2

## 2024-06-20 MED ORDER — FLUTICASONE PROPIONATE 50 MCG/ACT NA SUSP
2.0000 | Freq: Every day | NASAL | 1 refills | Status: DC
  Filled 2024-06-20: qty 16, 30d supply, fill #0
  Filled 2024-07-30: qty 16, 30d supply, fill #1

## 2024-07-26 ENCOUNTER — Telehealth: Payer: Self-pay

## 2024-07-26 ENCOUNTER — Ambulatory Visit: Admitting: Family Medicine

## 2024-07-26 NOTE — Telephone Encounter (Signed)
Any same day

## 2024-07-26 NOTE — Telephone Encounter (Signed)
 No same days available but Dr Bernardo has one opening for tomorrow. If it is still available please put pt there. Spoke with wife and she will have pt to call us  back she was not on dpr

## 2024-07-26 NOTE — Telephone Encounter (Signed)
 Copied from CRM #[id number]. Topic: Appointments - Appointment Scheduling >> Jul 26, 2024  8:11 AM Ivan Booker wrote: No temp, sniffing, coughing - not much in the chest.. Can he be seen by another PCP - His is not available today.. Just seems to can't get rid of the cough

## 2024-07-27 ENCOUNTER — Encounter: Payer: Self-pay | Admitting: Internal Medicine

## 2024-07-27 ENCOUNTER — Other Ambulatory Visit: Payer: Self-pay

## 2024-07-27 ENCOUNTER — Ambulatory Visit: Admitting: Internal Medicine

## 2024-07-27 VITALS — BP 126/84 | HR 81 | Temp 98.0°F | Resp 18 | Ht 73.0 in | Wt 247.3 lb

## 2024-07-27 DIAGNOSIS — R051 Acute cough: Secondary | ICD-10-CM | POA: Diagnosis not present

## 2024-07-27 DIAGNOSIS — J301 Allergic rhinitis due to pollen: Secondary | ICD-10-CM | POA: Diagnosis not present

## 2024-07-27 DIAGNOSIS — R062 Wheezing: Secondary | ICD-10-CM

## 2024-07-27 MED ORDER — LEVOCETIRIZINE DIHYDROCHLORIDE 5 MG PO TABS
5.0000 mg | ORAL_TABLET | Freq: Every evening | ORAL | 1 refills | Status: DC
  Filled 2024-07-27: qty 30, 30d supply, fill #0
  Filled 2024-08-23: qty 30, 30d supply, fill #1

## 2024-07-27 MED ORDER — BENZONATATE 100 MG PO CAPS
100.0000 mg | ORAL_CAPSULE | Freq: Two times a day (BID) | ORAL | 0 refills | Status: DC | PRN
  Filled 2024-07-27: qty 20, 10d supply, fill #0

## 2024-07-27 MED ORDER — ALBUTEROL SULFATE HFA 108 (90 BASE) MCG/ACT IN AERS
2.0000 | INHALATION_SPRAY | Freq: Four times a day (QID) | RESPIRATORY_TRACT | 2 refills | Status: AC | PRN
  Filled 2024-07-27: qty 8.5, 25d supply, fill #0

## 2024-07-27 NOTE — Progress Notes (Signed)
 Acute Office Visit  Subjective:     Patient ID: Ivan Booker, male    DOB: 1966/07/06, 58 y.o.   MRN: [id number]  Chief Complaint  Patient presents with   Cough    Runny nose, drainage for 1 week    HPI Patient is in today for runny nose and sinus drainage x 1 week.  Discussed the use of AI scribe software for clinical note transcription with the patient, who gave verbal consent to proceed.  History of Present Illness Ivan Booker is a 58 year old male who presents with persistent sinus drainage and cough.  He has experienced sinus drainage and cough for about a week, with symptoms worsening at night when lying down, leading to coughing. He uses Vicks and sleeps in an elevated position on the couch to alleviate symptoms.  He regularly uses Flonase , which he believes helps his symptoms, and has been taking over-the-counter cold medications since last Saturday. He has not been taking any other allergy medications.  Episodes of wheezing have been noted, particularly during dinner, but he does not currently feel or hear wheezing. No fever is present. He feels drainage in his throat and occasional wheezing, but no current wheezing or shortness of breath.   Review of Systems  Constitutional:  Negative for chills and fever.  HENT:  Positive for sore throat. Negative for sinus pain.   Respiratory:  Positive for cough and wheezing. Negative for shortness of breath.         Objective:    BP 126/84 (Cuff Size: Large)   Pulse 81   Temp 98 F (36.7 C) (Oral)   Resp 18   Ht 6' 1 (1.854 m)   Wt 247 lb 4.8 oz (112.2 kg)   SpO2 98%   BMI 32.63 kg/m  BP Readings from Last 3 Encounters:  07/27/24 126/84  06/18/24 104/70  04/25/24 118/74   Wt Readings from Last 3 Encounters:  07/27/24 247 lb 4.8 oz (112.2 kg)  06/18/24 244 lb 4.8 oz (110.8 kg)  04/25/24 242 lb 8 oz (110 kg)      Physical Exam Constitutional:      Appearance: Normal appearance.  HENT:     Head:  Normocephalic and atraumatic.     Right Ear: Tympanic membrane, ear canal and external ear normal.     Left Ear: Tympanic membrane, ear canal and external ear normal.     Nose: Nose normal.     Mouth/Throat:     Mouth: Mucous membranes are moist.     Pharynx: Posterior oropharyngeal erythema present.  Eyes:     Conjunctiva/sclera: Conjunctivae normal.  Cardiovascular:     Rate and Rhythm: Normal rate and regular rhythm.  Pulmonary:     Effort: Pulmonary effort is normal.     Breath sounds: Normal breath sounds. No wheezing, rhonchi or rales.  Skin:    General: Skin is warm and dry.  Neurological:     General: No focal deficit present.     Mental Status: He is alert. Mental status is at baseline.  Psychiatric:        Mood and Affect: Mood normal.        Behavior: Behavior normal.     No results found for any visits on 07/27/24.      Assessment & Plan:   Assessment & Plan Acute cough and sinus drainage with allergic rhinitis Acute cough and sinus drainage likely due to allergic rhinitis. No infection signs. Flonase  reduces sinus  inflammation. - Continue Flonase , two sprays each side twice daily. - Prescribe Xyzal  5 mg as needed for allergy symptoms. - Advise antihistamine use for 2-3 weeks, assess improvement. Discussed drowsiness.  - benzonatate  (TESSALON ) 100 MG capsule; Take 1 capsule (100 mg total) by mouth 2 (two) times daily as needed for cough.  Dispense: 20 capsule; Refill: 0 - albuterol  (VENTOLIN  HFA) 108 (90 Base) MCG/ACT inhaler; Inhale 2 puffs into the lungs every 6 (six) hours as needed for wheezing or shortness of breath.  Dispense: 8.5 g; Refill: 2 - levocetirizine (XYZAL ) 5 MG tablet; Take 1 tablet (5 mg total) by mouth every evening.  Dispense: 30 tablet; Refill: 1   Return if symptoms worsen or fail to improve.  Sharyle Fischer, DO

## 2024-07-30 ENCOUNTER — Other Ambulatory Visit: Payer: Self-pay

## 2024-07-30 ENCOUNTER — Other Ambulatory Visit: Payer: Self-pay | Admitting: Family Medicine

## 2024-07-30 DIAGNOSIS — E1049 Type 1 diabetes mellitus with other diabetic neurological complication: Secondary | ICD-10-CM

## 2024-07-30 MED FILL — Insulin Lispro Soln Pen-injector 100 Unit/ML (1 Unit Dial): SUBCUTANEOUS | 94 days supply | Qty: 45 | Fill #0 | Status: AC

## 2024-07-31 ENCOUNTER — Other Ambulatory Visit: Payer: Self-pay

## 2024-08-01 ENCOUNTER — Other Ambulatory Visit: Payer: Self-pay

## 2024-08-15 ENCOUNTER — Other Ambulatory Visit: Payer: Self-pay | Admitting: Family Medicine

## 2024-08-15 ENCOUNTER — Other Ambulatory Visit: Payer: Self-pay

## 2024-08-15 DIAGNOSIS — N521 Erectile dysfunction due to diseases classified elsewhere: Secondary | ICD-10-CM

## 2024-08-16 ENCOUNTER — Other Ambulatory Visit: Payer: Self-pay

## 2024-08-16 ENCOUNTER — Other Ambulatory Visit: Payer: Self-pay | Admitting: Family Medicine

## 2024-08-16 DIAGNOSIS — E1049 Type 1 diabetes mellitus with other diabetic neurological complication: Secondary | ICD-10-CM

## 2024-08-17 ENCOUNTER — Other Ambulatory Visit: Payer: Self-pay

## 2024-08-17 ENCOUNTER — Telehealth: Payer: Self-pay

## 2024-08-17 DIAGNOSIS — E1049 Type 1 diabetes mellitus with other diabetic neurological complication: Secondary | ICD-10-CM

## 2024-08-17 MED ORDER — DEXCOM G7 SENSOR MISC
1.0000 | 1 refills | Status: AC
  Filled 2024-08-17: qty 9, 90d supply, fill #0

## 2024-08-17 NOTE — Telephone Encounter (Signed)
 Copied from CRM #[id number]. Topic: Clinical - Prescription Issue >> Aug 16, 2024  4:49 PM Winona R wrote: Calling to check on the status of Continuous Glucose Sensor (DEXCOM G7 SENSOR) MISC refill. I informed the pt we have received the request from the pharmacy and it may take up to 3 business days. Pt understood.

## 2024-08-17 NOTE — Telephone Encounter (Signed)
 refilled

## 2024-08-21 ENCOUNTER — Other Ambulatory Visit: Payer: Self-pay

## 2024-08-23 ENCOUNTER — Other Ambulatory Visit: Payer: Self-pay | Admitting: Family Medicine

## 2024-08-23 DIAGNOSIS — R0982 Postnasal drip: Secondary | ICD-10-CM

## 2024-08-24 ENCOUNTER — Other Ambulatory Visit: Payer: Self-pay

## 2024-08-24 ENCOUNTER — Other Ambulatory Visit: Payer: Self-pay | Admitting: Family Medicine

## 2024-08-24 DIAGNOSIS — R0982 Postnasal drip: Secondary | ICD-10-CM

## 2024-08-25 ENCOUNTER — Other Ambulatory Visit: Payer: Self-pay

## 2024-08-27 ENCOUNTER — Other Ambulatory Visit: Payer: Self-pay

## 2024-08-27 ENCOUNTER — Other Ambulatory Visit: Payer: Self-pay | Admitting: Family Medicine

## 2024-08-27 DIAGNOSIS — R0982 Postnasal drip: Secondary | ICD-10-CM

## 2024-08-28 ENCOUNTER — Other Ambulatory Visit: Payer: Self-pay

## 2024-08-30 ENCOUNTER — Other Ambulatory Visit: Payer: Self-pay

## 2024-08-30 MED FILL — Fluticasone Propionate Nasal Susp 50 MCG/ACT: NASAL | 30 days supply | Qty: 16 | Fill #0 | Status: AC

## 2024-08-30 NOTE — Telephone Encounter (Signed)
 Requested Prescriptions  Pending Prescriptions Disp Refills   fluticasone  (FLONASE ) 50 MCG/ACT nasal spray 16 g 1    Sig: Place 2 sprays into both nostrils daily.     Ear, Nose, and Throat: Nasal Preparations - Corticosteroids Passed - 08/30/2024  1:28 PM      Passed - Valid encounter within last 12 months    Recent Outpatient Visits           1 month ago Seasonal allergic rhinitis due to pollen   [facility] Bernardo Fend, DO   2 months ago Type 1 diabetes mellitus with peripheral neuropathy [facility])   [facility] Glenard Mire, MD   4 months ago Well adult exam   [facility] Glenard Mire, MD   5 months ago Type 1 diabetes mellitus with peripheral neuropathy [facility])   [facility] Glenard Mire, MD   8 months ago GERD with apnea without esophagitis   [facility] Sowles, Krichna, MD

## 2024-09-17 ENCOUNTER — Ambulatory Visit: Admitting: Family Medicine

## 2024-09-17 ENCOUNTER — Encounter: Payer: Self-pay | Admitting: Family Medicine

## 2024-09-17 VITALS — BP 114/72 | HR 69 | Resp 16 | Ht 73.0 in | Wt 249.7 lb

## 2024-09-17 DIAGNOSIS — E1042 Type 1 diabetes mellitus with diabetic polyneuropathy: Secondary | ICD-10-CM | POA: Diagnosis not present

## 2024-09-17 DIAGNOSIS — N521 Erectile dysfunction due to diseases classified elsewhere: Secondary | ICD-10-CM | POA: Diagnosis not present

## 2024-09-17 DIAGNOSIS — E1069 Type 1 diabetes mellitus with other specified complication: Secondary | ICD-10-CM

## 2024-09-17 DIAGNOSIS — Z794 Long term (current) use of insulin: Secondary | ICD-10-CM

## 2024-09-17 DIAGNOSIS — E785 Hyperlipidemia, unspecified: Secondary | ICD-10-CM

## 2024-09-17 DIAGNOSIS — K219 Gastro-esophageal reflux disease without esophagitis: Secondary | ICD-10-CM

## 2024-09-17 DIAGNOSIS — E1049 Type 1 diabetes mellitus with other diabetic neurological complication: Secondary | ICD-10-CM | POA: Diagnosis not present

## 2024-09-17 DIAGNOSIS — R0681 Apnea, not elsewhere classified: Secondary | ICD-10-CM | POA: Diagnosis not present

## 2024-09-17 DIAGNOSIS — E139 Other specified diabetes mellitus without complications: Secondary | ICD-10-CM | POA: Insufficient documentation

## 2024-09-17 NOTE — Progress Notes (Signed)
 Name: DEJOHN IBARRA   MRN: [id number]    DOB: 04-06-1966   Date:09/17/2024       Progress Note  Subjective  Chief Complaint  Chief Complaint  Patient presents with   Medical Management of Chronic Issues   Discussed the use of AI scribe software for clinical note transcription with the patient, who gave verbal consent to proceed.  History of Present Illness Ivan Booker is a 58 year old male with type 1 diabetes who presents for routine follow-up.  He monitors his glucose levels using a Dexcom device, with an average glucose of 142 mg/dL and a 07% factor. Glucose levels are in range 68% of the time, though he sometimes turns off the device while sleeping. He experiences occasional high glucose levels, such as a recent episode over 200 mg/dL, prompting a small insulin  dose. Fewer episodes of low blood sugar occur during sleep recently.  He uses Tresiba , a long-acting insulin , at 36 units in the morning, and follows a sliding scale for additional insulin  dosing. He also takes pregabalin  50 mg twice daily for peripheral neuropathy, which he finds effective, especially when combined with a TENS unit for additional relief.  No excessive hunger, thirst, or frequent urination. He takes Prilosec daily for heartburn with no issues of indigestion. He also takes atorvastatin  for dyslipidemia and denies any muscle aches. He has enough Viagra  for erectile dysfunction.  During the holidays, he tries to manage his diet carefully, although he finds it challenging to avoid foods that spike his blood sugar, such as bread and lasagna. He will have a house full of people for the holidays.   Date of Download: 09/17/2024 % Time CGM is active: 92% Average Glucose: 142 mg/dL Glucose Management Indicator: Dexcom  Glucose Variability: 38.5 (goal <36%) Time in Goal:  - Time in range 70-180: 68% - Time above range: 24% - Time below range: 8%   Patient Active Problem List   Diagnosis Date Noted   GERD with  apnea without esophagitis 03/16/2024   Low serum vitamin B12 03/24/2023   Erectile dysfunction 06/26/2015   BPH (benign prostatic hyperplasia) 03/20/2015   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) 03/07/2015   Dyslipidemia 03/07/2015   Hypogonadism male 03/07/2015   Obesity (BMI 30.0-34.9) 03/07/2015   Allergic rhinitis, seasonal 03/07/2015   Vitamin D  deficiency 03/07/2015    Past Surgical History:  Procedure Laterality Date   VASECTOMY  1993    Family History  Problem Relation Age of Onset   COPD Father        Smoker    Bladder Cancer Neg Hx    Kidney cancer Neg Hx    Prostate cancer Neg Hx     Social History   Tobacco Use   Smoking status: Never   Smokeless tobacco: Never  Substance Use Topics   Alcohol use: Not Currently    Comment: 1-2 beers on weekends    Current Medications[1]  Allergies[2]  I personally reviewed active problem list, medication list, allergies, family history with the patient/caregiver today.   ROS  Ten systems reviewed and is negative except as mentioned in HPI    Objective Physical Exam CONSTITUTIONAL: Patient appears well-developed and well-nourished. No distress. HEENT: Head atraumatic, normocephalic, neck supple. CARDIOVASCULAR: Normal rate, regular rhythm and normal heart sounds. No murmur heard. No BLE edema. PULMONARY: Effort normal and breath sounds normal. Lungs clear to auscultation bilaterally. No respiratory distress. ABDOMINAL: There is no tenderness or distention. MUSCULOSKELETAL: Normal gait.  Without gross motor or sensory deficit. PSYCHIATRIC: Patient has a normal mood and affect. Behavior is normal. Judgment and thought content normal.  Vitals:   09/17/24 1411  BP: 114/72  Pulse: 69  Resp: 16  SpO2: 94%  Weight: 249 lb 11.2 oz (113.3 kg)  Height: 6' 1 (1.854 m)    Body mass index is 32.94 kg/m.   PHQ2/9:    09/17/2024    2:10 PM 06/18/2024    2:05 PM 04/25/2024     1:52 PM 03/16/2024    2:22 PM 11/21/2023    2:17 PM  Depression screen PHQ 2/9  Decreased Interest 0 0 0 0 0  Down, Depressed, Hopeless 0 0 0 0 0  PHQ - 2 Score 0 0 0 0 0  Altered sleeping  0 0 0 0  Tired, decreased energy  0 0 0 0  Change in appetite  0 0 0 0  Feeling bad or failure about yourself   0 0 0 0  Trouble concentrating  0 0 0 0  Moving slowly or fidgety/restless  0 0 0 0  Suicidal thoughts  0 0 0 0  PHQ-9 Score  0  0  0  0   Difficult doing work/chores  Not difficult at all Not difficult at all Not difficult at all Not difficult at all     Data saved with a previous flowsheet row definition    phq 9 is negative  Fall Risk:    09/17/2024    2:10 PM 06/18/2024    2:05 PM 04/25/2024    1:51 PM 03/16/2024    2:22 PM 11/21/2023    2:17 PM  Fall Risk   Falls in the past year? 0 0 0 0 0  Number falls in past yr: 0 0 0 0 0  Injury with Fall? 0 0  0  0  0   Risk for fall due to : No Fall Risks No Fall Risks No Fall Risks No Fall Risks No Fall Risks  Follow up Falls evaluation completed Falls evaluation completed Falls evaluation completed Falls prevention discussed;Education provided;Falls evaluation completed Falls prevention discussed;Education provided;Falls evaluation completed     Data saved with a previous flowsheet row definition      Assessment & Plan LADA treated as Type 1 diabetes mellitus with peripheral , dyslipidemia and neurological complications Type 1 diabetes with peripheral neuropathy. Glucose levels generally well-controlled. Coefficient of variation slightly above goal. Peripheral neuropathy managed with pregabalin  and TENS unit. - Continue Tresiba  36 units in the morning. - Continue sliding scale insulin  as needed. - Continue pregabalin  50 mg twice daily. - Continue using TENS unit for peripheral neuropathy. - Monitor blood glucose levels with Dexcom. - Consider Dexcom 15-day sensor in the future if needed.  Gastro-esophageal reflux  disease Well-controlled with daily Prilosec. - Continue Prilosec daily.        [1]  Current Outpatient Medications:    albuterol  (VENTOLIN  HFA) 108 (90 Base) MCG/ACT inhaler, Inhale 2 puffs into the lungs every 6 (six) hours as needed for wheezing or shortness of breath., Disp: 8.5 g, Rfl: 2   aspirin 81 MG chewable tablet, Chew 1 tablet by mouth daily., Disp: , Rfl:    atorvastatin  (LIPITOR) 40 MG tablet, Take 1 tablet (40 mg total) by mouth daily., Disp: 90 tablet, Rfl: 3   benzonatate  (TESSALON ) 100 MG capsule, Take 1 capsule (100 mg total) by mouth 2 (two) times daily as needed for cough., Disp: 20 capsule, Rfl: 0  Blood Glucose Monitoring Suppl (ONE TOUCH ULTRA 2) w/Device KIT, Check blood glucose 3 (three) times daily., Disp: 1 kit, Rfl: 0   Cholecalciferol (VITAMIN D ) 2000 UNITS CAPS, Take 1 capsule by mouth daily., Disp: , Rfl:    Continuous Glucose Sensor (DEXCOM G7 SENSOR) MISC, Use as directed to check blood sugar every 10 days., Disp: 9 each, Rfl: 1   fluticasone  (FLONASE ) 50 MCG/ACT nasal spray, Place 2 sprays into both nostrils daily., Disp: 16 g, Rfl: 1   Glucagon  (GVOKE HYPOPEN  1-PACK) 1 MG/0.2ML SOAJ, Inject 1 mg into the skin daily as needed. May repeat in 15 minutes, Disp: 0.4 mL, Rfl: 1   Glucose Blood (BLOOD GLUCOSE TEST STRIPS) STRP, Use to check blood sugar five times daily., Disp: 450 each, Rfl: 1   insulin  degludec (TRESIBA  FLEXTOUCH) 200 UNIT/ML FlexTouch Pen, Inject 36 Units into the skin daily., Disp: 9 mL, Rfl: 2   insulin  lispro (HUMALOG ) 100 UNIT/ML KwikPen, Inject 5-16 Units into the skin 3 (three) times daily with meals., Disp: 45 mL, Rfl: 1   Insulin  Pen Needle (INSUPEN PEN NEEDLES) 31G X 8 MM MISC, Use as directed to inject Tresiba /Humalog  subcutaneously., Disp: 100 each, Rfl: 3   Lancets (FREESTYLE) lancets, Use as instructed, Disp: 100 each, Rfl: 12   levocetirizine (XYZAL ) 5 MG tablet, Take 1 tablet (5 mg total) by mouth every evening., Disp: 30 tablet,  Rfl: 1   omeprazole  (PRILOSEC) 20 MG capsule, Take 1 capsule (20 mg total) by mouth daily., Disp: 90 capsule, Rfl: 1   pregabalin  (LYRICA ) 50 MG capsule, Take 1 capsule (50 mg total) by mouth 2 (two) times daily., Disp: 180 capsule, Rfl: 1   sildenafil  (VIAGRA ) 100 MG tablet, Take 0.5-1 tablets (50-100 mg total) by mouth daily as needed for erectile dysfunction., Disp: 90 tablet, Rfl: 0 [2] No Known Allergies

## 2024-09-23 ENCOUNTER — Other Ambulatory Visit: Payer: Self-pay | Admitting: Internal Medicine

## 2024-09-23 DIAGNOSIS — J301 Allergic rhinitis due to pollen: Secondary | ICD-10-CM

## 2024-09-23 MED FILL — Fluticasone Propionate Nasal Susp 50 MCG/ACT: NASAL | 30 days supply | Qty: 16 | Fill #1 | Status: AC

## 2024-09-24 ENCOUNTER — Telehealth: Payer: Self-pay

## 2024-09-24 ENCOUNTER — Other Ambulatory Visit: Payer: Self-pay

## 2024-09-24 ENCOUNTER — Other Ambulatory Visit: Payer: Self-pay | Admitting: Internal Medicine

## 2024-09-24 DIAGNOSIS — J301 Allergic rhinitis due to pollen: Secondary | ICD-10-CM

## 2024-09-24 MED ORDER — BLOOD GLUCOSE MONITOR KIT
PACK | 0 refills | Status: AC
  Filled 2024-09-24: qty 1, 30d supply, fill #0

## 2024-09-24 MED ORDER — GLUCOSE BLOOD VI STRP
ORAL_STRIP | 12 refills | Status: AC
  Filled 2024-09-24: qty 100, 30d supply, fill #0

## 2024-09-24 NOTE — Telephone Encounter (Signed)
 Copied from CRM #[id number]. Topic: Clinical - Prescription Issue >> Sep 24, 2024  1:12 PM Avram MATSU wrote: Reason for CRM: patient stated his insurance will not cover one touch an would need ree style precision Please advise [phone]  [facility] [address] [place] [address] Phone: [phone] Fax: [phone]

## 2024-09-25 ENCOUNTER — Other Ambulatory Visit: Payer: Self-pay

## 2024-09-26 ENCOUNTER — Other Ambulatory Visit: Payer: Self-pay | Admitting: Family Medicine

## 2024-09-26 ENCOUNTER — Other Ambulatory Visit: Payer: Self-pay

## 2024-09-26 DIAGNOSIS — J301 Allergic rhinitis due to pollen: Secondary | ICD-10-CM

## 2024-09-26 MED FILL — Levocetirizine Dihydrochloride Tab 5 MG: ORAL | 90 days supply | Qty: 90 | Fill #0 | Status: AC

## 2024-09-26 NOTE — Telephone Encounter (Signed)
 Requested Prescriptions  Pending Prescriptions Disp Refills   levocetirizine (XYZAL ) 5 MG tablet 90 tablet 0    Sig: Take 1 tablet (5 mg total) by mouth every evening.     Ear, Nose, and Throat:  Antihistamines - levocetirizine dihydrochloride  Passed - 09/26/2024  9:32 AM      Passed - Cr in normal range and within 360 days    Creat  Date Value Ref Range Status  04/25/2024 0.96 0.70 - 1.35 mg/dL Final   Creatinine, Urine  Date Value Ref Range Status  04/25/2024 197 20 - 320 mg/dL Final         Passed - eGFR is 10 or above and within 360 days    GFR, Est African American  Date Value Ref Range Status  02/16/2021 94 > OR = 60 mL/min/1.68m2 Final   GFR, Est Non African American  Date Value Ref Range Status  02/16/2021 81 > OR = 60 mL/min/1.80m2 Final   eGFR  Date Value Ref Range Status  04/25/2024 90 > OR = 60 mL/min/1.8m2 Final         Passed - Valid encounter within last 12 months    Recent Outpatient Visits           1 week ago LADA (latent autoimmune diabetes in adults), managed as type 1 [facility])   [place] [facility] [place], Dorette, MD   2 months ago Seasonal allergic rhinitis due to pollen   [facility] Bernardo Fend, DO   3 months ago Type 1 diabetes mellitus with peripheral neuropathy [facility])   [place] [facility] [place] Dorette, MD   5 months ago Well adult exam   [facility] [place], Krichna, MD   6 months ago Type 1 diabetes mellitus with peripheral neuropathy [facility])   [facility] Sowles, Krichna, MD

## 2024-09-27 NOTE — Telephone Encounter (Signed)
 Requested Prescriptions  Refused Prescriptions Disp Refills   levocetirizine (XYZAL ) 5 MG tablet 30 tablet 1    Sig: Take 1 tablet (5 mg total) by mouth every evening.     Ear, Nose, and Throat:  Antihistamines - levocetirizine dihydrochloride  Passed - 09/27/2024 12:18 PM      Passed - Cr in normal range and within 360 days    Creat  Date Value Ref Range Status  04/25/2024 0.96 0.70 - 1.35 mg/dL Final   Creatinine, Urine  Date Value Ref Range Status  04/25/2024 197 20 - 320 mg/dL Final         Passed - eGFR is 10 or above and within 360 days    GFR, Est African American  Date Value Ref Range Status  02/16/2021 94 > OR = 60 mL/min/1.64m2 Final   GFR, Est Non African American  Date Value Ref Range Status  02/16/2021 81 > OR = 60 mL/min/1.70m2 Final   eGFR  Date Value Ref Range Status  04/25/2024 90 > OR = 60 mL/min/1.66m2 Final         Passed - Valid encounter within last 12 months    Recent Outpatient Visits           1 week ago LADA (latent autoimmune diabetes in adults), managed as type 1 [facility])   [place] [facility] [place], Dorette, MD   2 months ago Seasonal allergic rhinitis due to pollen   [facility] Fend, DO   3 months ago Type 1 diabetes mellitus with peripheral neuropathy [facility])    [place] [facility] [place] Dorette, MD   5 months ago Well adult exam   [facility] Sowles, Krichna, MD   6 months ago Type 1 diabetes mellitus with peripheral neuropathy [facility])   [place] [facility] Sowles, Krichna, MD

## 2024-10-03 ENCOUNTER — Other Ambulatory Visit: Payer: Self-pay

## 2024-10-29 ENCOUNTER — Other Ambulatory Visit: Payer: Self-pay | Admitting: Family Medicine

## 2024-10-29 DIAGNOSIS — E1069 Type 1 diabetes mellitus with other specified complication: Secondary | ICD-10-CM

## 2024-10-29 DIAGNOSIS — R0982 Postnasal drip: Secondary | ICD-10-CM

## 2024-10-29 MED FILL — Insulin Lispro Soln Pen-injector 100 Unit/ML (1 Unit Dial): SUBCUTANEOUS | 94 days supply | Qty: 45 | Fill #1 | Status: AC

## 2024-10-30 ENCOUNTER — Other Ambulatory Visit: Payer: Self-pay

## 2024-10-30 ENCOUNTER — Other Ambulatory Visit: Payer: Self-pay | Admitting: Family Medicine

## 2024-10-30 DIAGNOSIS — R0681 Apnea, not elsewhere classified: Secondary | ICD-10-CM

## 2024-10-31 ENCOUNTER — Other Ambulatory Visit: Payer: Self-pay

## 2024-10-31 ENCOUNTER — Other Ambulatory Visit: Payer: Self-pay | Admitting: Family Medicine

## 2024-10-31 DIAGNOSIS — N521 Erectile dysfunction due to diseases classified elsewhere: Secondary | ICD-10-CM

## 2024-10-31 DIAGNOSIS — R0982 Postnasal drip: Secondary | ICD-10-CM

## 2024-10-31 DIAGNOSIS — R0681 Apnea, not elsewhere classified: Secondary | ICD-10-CM

## 2024-11-01 ENCOUNTER — Other Ambulatory Visit: Payer: Self-pay

## 2024-11-01 MED FILL — Fluticasone Propionate Nasal Susp 50 MCG/ACT: NASAL | 30 days supply | Qty: 16 | Fill #0 | Status: AC

## 2024-11-01 MED FILL — Sildenafil Citrate Tab 100 MG: ORAL | 90 days supply | Qty: 90 | Fill #0 | Status: AC

## 2024-11-01 MED FILL — Omeprazole Cap Delayed Release 20 MG: ORAL | 90 days supply | Qty: 90 | Fill #0 | Status: AC

## 2024-11-01 NOTE — Telephone Encounter (Signed)
 Requested Prescriptions  Pending Prescriptions Disp Refills   fluticasone  (FLONASE ) 50 MCG/ACT nasal spray 16 g 0    Sig: Place 2 sprays into both nostrils daily.     Ear, Nose, and Throat: Nasal Preparations - Corticosteroids Passed - 11/01/2024  3:32 PM      Passed - Valid encounter within last 12 months    Recent Outpatient Visits           1 month ago LADA (latent autoimmune diabetes in adults), managed as type 1 [facility])   [place] [facility] [place], Dorette, MD   3 months ago Seasonal allergic rhinitis due to pollen   [facility] Bernardo Fend, DO   4 months ago Type 1 diabetes mellitus with peripheral neuropathy [facility])   [place] [facility] Glen[facility] Dorette, MD   6 months ago Well adult exam   [facility] [place] D[facility], MD   7 months ago Type 1 diabetes mellitus with peripheral neuropathy [facility])   [facility] [place], Krichna, MD               omeprazole  (PRILOSEC) 20 MG capsule 90 capsule 0    Sig: Take 1 capsule (20 mg total) by mouth daily.     Gastroenterology: Proton Pump Inhibitors Passed - 11/01/2024  3:32 PM      Passed - Valid encounter within last 12 months    Recent Outpatient Visits           1 month ago LADA (latent autoimmune diabetes in adults), managed as type 1 [facility])   [place] [facility] [place], Dorette, MD   3 months ago Seasonal allergic rhinitis due to pollen   [facility] Bern[facility] Fend, DO   4 months ago Type 1 diabetes mellitus with peripheral neuropathy [facility])   [place] [facility] [place] Dorette, MD   6 months ago Well adult exam   [facility] [place] D[place], MD   7 months ago Type 1 diabetes mellitus with peripheral neuropathy [facility])   [place] [facility] [place], Krichna, MD               sildenafil   (VIAGRA ) 100 MG tablet 90 tablet 0    Sig: Take 0.5-1 tablets (50-100 mg total) by mouth daily as needed for erectile dysfunction.     Urology: Erectile Dysfunction Agents Passed - 11/01/2024  3:32 PM      Passed - AST in normal range and within 360 days    AST  Date Value Ref Range Status  04/25/2024 20 10 - 35 U/L Final         Passed - ALT in normal range and within 360 days    ALT  Date Value Ref Range Status  04/25/2024 26 9 - 46 U/L Final         Passed - Last BP in normal range    BP Readings from Last 1 Encounters:  09/17/24 114/72         Passed - Valid encounter within last 12 months    Recent Outpatient Visits           1 month ago LADA (latent autoimmune diabetes in adults), managed as type 1 [facility])   [place] [facility] [place], Dorette, MD   3 months ago Seasonal allergic rhinitis due to pollen   [facility] Fend, [place]  4 months ago Type 1 diabetes mellitus with peripheral neuropathy [facility])   [place] [facility] Glenard Mire, MD   6 months ago Well adult exam   [facility] Mire, MD   7 months ago Type 1 diabetes mellitus with peripheral neuropathy [facility])   [facility] Sowles, Krichna, MD

## 2024-12-18 ENCOUNTER — Ambulatory Visit: Admitting: Family Medicine
